# Patient Record
Sex: Male | Born: 1950 | Race: Black or African American | Hispanic: No | Marital: Married | Smoking: Current every day smoker
Health system: Southern US, Community
[De-identification: ages and names within clinical notes are randomized; demographics above are authoritative.]

## PROBLEM LIST (undated history)

## (undated) DIAGNOSIS — E039 Hypothyroidism, unspecified: Secondary | ICD-10-CM

## (undated) DIAGNOSIS — I10 Essential (primary) hypertension: Secondary | ICD-10-CM

## (undated) DIAGNOSIS — I714 Abdominal aortic aneurysm, without rupture, unspecified: Secondary | ICD-10-CM

## (undated) DIAGNOSIS — N189 Chronic kidney disease, unspecified: Secondary | ICD-10-CM

## (undated) DIAGNOSIS — E119 Type 2 diabetes mellitus without complications: Secondary | ICD-10-CM

## (undated) DIAGNOSIS — I739 Peripheral vascular disease, unspecified: Secondary | ICD-10-CM

## (undated) HISTORY — DX: Type 2 diabetes mellitus without complications: E11.9

## (undated) HISTORY — PX: BACK SURGERY: SHX140

## (undated) HISTORY — DX: Chronic kidney disease, unspecified: N18.9

## (undated) HISTORY — PX: SPINE SURGERY: SHX786

---

## 2004-07-27 ENCOUNTER — Ambulatory Visit (HOSPITAL_COMMUNITY): Admission: RE | Admit: 2004-07-27 | Discharge: 2004-07-27 | Payer: Self-pay | Admitting: Family Medicine

## 2004-12-21 ENCOUNTER — Ambulatory Visit (HOSPITAL_COMMUNITY): Admission: RE | Admit: 2004-12-21 | Discharge: 2004-12-21 | Payer: Self-pay | Admitting: *Deleted

## 2010-04-03 ENCOUNTER — Encounter: Payer: Self-pay | Admitting: Gastroenterology

## 2010-04-10 ENCOUNTER — Encounter: Payer: Self-pay | Admitting: Gastroenterology

## 2010-04-13 ENCOUNTER — Ambulatory Visit: Payer: Self-pay | Admitting: Gastroenterology

## 2010-04-13 ENCOUNTER — Ambulatory Visit (HOSPITAL_COMMUNITY): Admission: RE | Admit: 2010-04-13 | Discharge: 2010-04-13 | Payer: Self-pay | Admitting: Gastroenterology

## 2010-05-24 ENCOUNTER — Encounter (INDEPENDENT_AMBULATORY_CARE_PROVIDER_SITE_OTHER): Payer: Self-pay | Admitting: *Deleted

## 2010-08-09 NOTE — Letter (Signed)
Summary: Internal Other Lynne Logan  Internal Other Lynne Logan   Imported By: Waldon Merl LPN [id number]  _____________________________________________________________________  External Attachment:    Type:   Image     Comment:   External Document

## 2010-08-09 NOTE — Miscellaneous (Signed)
Summary: OP NOTE SIGNED  Clinical Lists Changes NAME:  Juan Bass, Juan Bass            ACCOUNT NO.:  [id number]      MEDICAL RECORD NO.:  [id number]          PATIENT TYPE:  AMB      LOCATION:  DAY                           FACILITY:  [facility]      PHYSICIAN:  Barney Drain, M.D.     DATE OF BIRTH:  [birth year]      DATE OF PROCEDURE:  04/13/2010   DATE OF DISCHARGE:                                  OPERATIVE REPORT         REFERRING PHYSICIAN:  Halford Chessman, MD      PROCEDURE:  Colonoscopy.      INDICATION FOR EXAM:  Juan Bass is a 59 year old male who presents   for average-risk colon cancer screening.      FINDINGS:   1. Extremely redundant transverse colon which made intubating the       cecum challenging.   2. Pancolonic diverticulosis, most pronounced in the ascending and       sigmoid colon.  Otherwise; no polyps, masses, inflammatory changes       or AVMs seen.   3. Small internal hemorrhoids.  Otherwise normal retroflexed view of       the rectum.      RECOMMENDATIONS:   1. Screening colonoscopy in 10 years with overtube and a 1-hour time       slot.   2. He should follow a high-fiber diet.  He is given a handout on high-       fiber diet, diverticulosis and hemorrhoids.      MEDICATIONS:   1. Demerol 50 mg IV.   2. Versed 3 mg IV.      PROCEDURE TECHNIQUE:  Physical exam was performed.  Informed consent was   obtained from the patient after explaining the benefits, risks and   alternatives to the procedure.  The patient was connected to the monitor   and placed in left lateral position.  Continuous oxygen was provided by   nasal cannula and IV medicine administered through an indwelling   cannula.  After administration of sedation and rectal exam, the   patient's rectum was intubated and the scope was advanced under direct   visualization to the cecum.  Intubating the cecum required multiple   changes in position and abdominal pressure.  The scope was removed   slowly by carefully examining the color, texture, anatomy and integrity   of the mucosa on the way out.The patient was recovered in endoscopy and discharged home in   satisfactory condition.      Total withdrawal time:  24 minutes.         Barney Drain, M.D.               SF/MEDQ  D:  04/13/2010  T:  04/13/2010  Job:  [id number]      Electronically Signed by Barney Drain M.D. on 05/24/2010 10:00:43 AM

## 2010-08-09 NOTE — Letter (Signed)
Summary: Internal Other  Internal Other   Imported By: Waldon Merl LPN [id number]  _____________________________________________________________________  External Attachment:    Type:   Image     Comment:   External Document

## 2013-12-27 ENCOUNTER — Other Ambulatory Visit (HOSPITAL_COMMUNITY): Payer: Self-pay | Admitting: Family Medicine

## 2013-12-27 DIAGNOSIS — I739 Peripheral vascular disease, unspecified: Secondary | ICD-10-CM

## 2013-12-30 ENCOUNTER — Ambulatory Visit (HOSPITAL_COMMUNITY)
Admission: RE | Admit: 2013-12-30 | Discharge: 2013-12-30 | Disposition: A | Discharge status: Home / Self Care | Payer: BC Managed Care – PPO | Source: Ambulatory Visit | Attending: Family Medicine | Admitting: Family Medicine

## 2013-12-30 DIAGNOSIS — I743 Embolism and thrombosis of arteries of the lower extremities: Secondary | ICD-10-CM | POA: Insufficient documentation

## 2013-12-30 DIAGNOSIS — I739 Peripheral vascular disease, unspecified: Secondary | ICD-10-CM

## 2014-01-04 ENCOUNTER — Other Ambulatory Visit: Payer: Self-pay | Admitting: *Deleted

## 2014-01-04 DIAGNOSIS — I70219 Atherosclerosis of native arteries of extremities with intermittent claudication, unspecified extremity: Secondary | ICD-10-CM

## 2014-01-13 ENCOUNTER — Encounter: Payer: Self-pay | Admitting: Vascular Surgery

## 2014-02-07 ENCOUNTER — Encounter: Payer: Self-pay | Admitting: Vascular Surgery

## 2014-02-08 ENCOUNTER — Encounter: Payer: Self-pay | Admitting: Vascular Surgery

## 2014-02-08 ENCOUNTER — Ambulatory Visit (HOSPITAL_COMMUNITY)
Admission: RE | Admit: 2014-02-08 | Discharge: 2014-02-08 | Disposition: A | Discharge status: Home / Self Care | Payer: BC Managed Care – PPO | Source: Ambulatory Visit | Attending: Vascular Surgery | Admitting: Vascular Surgery

## 2014-02-08 ENCOUNTER — Ambulatory Visit (INDEPENDENT_AMBULATORY_CARE_PROVIDER_SITE_OTHER): Payer: BC Managed Care – PPO | Admitting: Vascular Surgery

## 2014-02-08 VITALS — BP 161/88 | HR 75 | Ht 69.0 in | Wt 180.7 lb

## 2014-02-08 DIAGNOSIS — I70219 Atherosclerosis of native arteries of extremities with intermittent claudication, unspecified extremity: Secondary | ICD-10-CM

## 2014-02-08 DIAGNOSIS — F17200 Nicotine dependence, unspecified, uncomplicated: Secondary | ICD-10-CM | POA: Insufficient documentation

## 2014-02-08 DIAGNOSIS — E119 Type 2 diabetes mellitus without complications: Secondary | ICD-10-CM | POA: Insufficient documentation

## 2014-02-08 NOTE — Progress Notes (Signed)
Subjective:     [name], male   DOB: [birth year], 63 y.o.   MRN: [id number]  HPI this 63 year old male is referred for evaluation of right leg claudication. This has been going on for the last few years becoming more. He developed right calf discomfort which progresses to the point that he must stop ambulating after about one block. He does not have rest pain or history of nonhealing ulcers infection or gangrene. He has no symptoms in the contralateral left leg.he does have a history of type 2 diabetes mellitus and 45+ pack year history of smoking and continues to smoke one pack per day. He has no history of cardiac events or neurologic events.  Past Medical History  Diagnosis Date  . Diabetes mellitus without complication     History  Substance Use Topics  . Smoking status: Current Every Day Smoker -- 1.00 packs/day for 40 years    Types: Cigarettes  . Smokeless tobacco: Never Used  . Alcohol Use: 0.6 - 1.2 oz/week    1-2 Cans of beer per week    Family History  Problem Relation Age of Onset  . Hypertension Mother   . Deep vein thrombosis Father   . Hypertension Father     No Known Allergies  Current outpatient prescriptions:amLODipine (NORVASC) 5 MG tablet, Take 5 mg by mouth daily. 2 tablets daily, Disp: , Rfl: ;  atorvastatin (LIPITOR) 20 MG tablet, Take 20 mg by mouth daily., Disp: , Rfl: ;  glyBURIDE (DIABETA) 5 MG tablet, Take 5 mg by mouth daily with breakfast., Disp: , Rfl: ;  labetalol (NORMODYNE) 100 MG tablet, Take 100 mg by mouth 2 (two) times daily., Disp: , Rfl:  losartan (COZAAR) 100 MG tablet, Take 100 mg by mouth daily., Disp: , Rfl: ;  HYDROcodone-acetaminophen (NORCO/VICODIN) 5-325 MG per tablet, Take 1 tablet by mouth every 6 (six) hours as needed for moderate pain., Disp: , Rfl:   BP 161/88  Pulse 75  Ht 5\' 9"  (1.753 m)  Wt 180 lb 11.2 oz (81.965 kg)  BMI 26.67 kg/m2  SpO2 99%  Body mass index is 26.67  kg/(m^2).           Review of Systems see history of present illness. Denies chest pain, dyspnea on exertion, PND, orthopnea, hemoptysis, lateralizing weakness, any aphasia. Patient does have some numbness in the right foot.. All other systems negative and a complete review of systems     Objective:   Physical Exam BP 161/88  Pulse 75  Ht 5\' 9"  (1.753 m)  Wt 180 lb 11.2 oz (81.965 kg)  BMI 26.67 kg/m2  SpO2 99%  Gen.-alert and oriented x3 in no apparent distress HEENT normal for age Lungs no rhonchi or wheezing Cardiovascular regular rhythm no murmurs carotid pulses 3+ palpable no bruits audible Abdomen soft nontender no palpable masses Musculoskeletal free of  major deformities Skin clear -no rashes Neurologic normal Lower extremities 3+ femoral and dorsalis pedis pulses palpable left leg. Right leg with 3+ femoral-absent popliteal and distal pulses. No evidence of ischemia or gangrene in the right foot. Slight decreased sensation right foot.  Today in order to duplex scan of the right leg which are viewed and interpreted. Patient has what appears to be a greater than 50% stenosis in the right mid superficial femoral artery but the vessel is patent. There is monophasic flow distal to this point and some monophasic flow proximally in the common femoral artery. 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 Trula Slade on Tuesday, August 18. Discussed with patient and his wife and they would like to proceed

## 2014-02-10 ENCOUNTER — Other Ambulatory Visit: Payer: Self-pay

## 2014-02-16 ENCOUNTER — Encounter (HOSPITAL_COMMUNITY): Payer: Self-pay | Admitting: Pharmacy Technician

## 2014-02-21 MED ORDER — SODIUM CHLORIDE 0.9 % IV SOLN
INTRAVENOUS | Status: DC
  Administered 2014-02-22: 07:00:00 via INTRAVENOUS

## 2014-02-22 ENCOUNTER — Encounter (HOSPITAL_COMMUNITY)
Admission: RE | Disposition: A | Discharge status: Home / Self Care | Payer: Self-pay | Source: Ambulatory Visit | Attending: Surgery

## 2014-02-22 ENCOUNTER — Other Ambulatory Visit: Payer: Self-pay

## 2014-02-22 ENCOUNTER — Ambulatory Visit (HOSPITAL_COMMUNITY)
Admission: RE | Admit: 2014-02-22 | Discharge: 2014-02-22 | Disposition: A | Discharge status: Home / Self Care | Payer: BC Managed Care – PPO | Source: Ambulatory Visit | Attending: Surgery | Admitting: Surgery

## 2014-02-22 DIAGNOSIS — I70219 Atherosclerosis of native arteries of extremities with intermittent claudication, unspecified extremity: Secondary | ICD-10-CM

## 2014-02-22 DIAGNOSIS — I714 Abdominal aortic aneurysm, without rupture, unspecified: Secondary | ICD-10-CM | POA: Diagnosis not present

## 2014-02-22 DIAGNOSIS — I745 Embolism and thrombosis of iliac artery: Secondary | ICD-10-CM | POA: Diagnosis not present

## 2014-02-22 DIAGNOSIS — E119 Type 2 diabetes mellitus without complications: Secondary | ICD-10-CM | POA: Insufficient documentation

## 2014-02-22 DIAGNOSIS — I739 Peripheral vascular disease, unspecified: Secondary | ICD-10-CM | POA: Diagnosis present

## 2014-02-22 DIAGNOSIS — F17200 Nicotine dependence, unspecified, uncomplicated: Secondary | ICD-10-CM | POA: Diagnosis not present

## 2014-02-22 DIAGNOSIS — Z48812 Encounter for surgical aftercare following surgery on the circulatory system: Secondary | ICD-10-CM

## 2014-02-22 HISTORY — PX: ABDOMINAL AORTAGRAM: SHX5454

## 2014-02-22 LAB — GLUCOSE, CAPILLARY
GLUCOSE-CAPILLARY: 161 mg/dL — AB (ref 70–99)
GLUCOSE-CAPILLARY: 167 mg/dL — AB (ref 70–99)

## 2014-02-22 LAB — POCT I-STAT, CHEM 8
BUN: 16 mg/dL (ref 6–23)
CALCIUM ION: 1.14 mmol/L (ref 1.13–1.30)
CREATININE: 1.8 mg/dL — AB (ref 0.50–1.35)
Chloride: 109 mEq/L (ref 96–112)
GLUCOSE: 150 mg/dL — AB (ref 70–99)
HCT: 38 % — ABNORMAL LOW (ref 39.0–52.0)
HEMOGLOBIN: 12.9 g/dL — AB (ref 13.0–17.0)
Potassium: 4 mEq/L (ref 3.7–5.3)
Sodium: 141 mEq/L (ref 137–147)
TCO2: 19 mmol/L (ref 0–100)

## 2014-02-22 LAB — POCT ACTIVATED CLOTTING TIME
Activated Clotting Time: 203 seconds
Activated Clotting Time: 231 seconds
Activated Clotting Time: 180 seconds

## 2014-02-22 SURGERY — ABDOMINAL AORTAGRAM
Anesthesia: LOCAL | Laterality: Right

## 2014-02-22 MED ORDER — SODIUM CHLORIDE 0.9 % IV SOLN: 1.0000 mL/kg/h | INTRAVENOUS | Status: DC

## 2014-02-22 MED ORDER — GUAIFENESIN-DM 100-10 MG/5ML PO SYRP
15.0000 mL | ORAL_SOLUTION | ORAL | Status: DC | PRN
  Filled 2014-02-22: qty 15

## 2014-02-22 MED ORDER — METOPROLOL TARTRATE 1 MG/ML IV SOLN
2.0000 mg | INTRAVENOUS | Status: DC | PRN
Start: 2014-02-22 — End: 2014-02-22

## 2014-02-22 MED ORDER — HEPARIN (PORCINE) IN NACL 2-0.9 UNIT/ML-% IJ SOLN
INTRAMUSCULAR | Status: AC
  Filled 2014-02-22: qty 1000

## 2014-02-22 MED ORDER — ONDANSETRON HCL 4 MG/2ML IJ SOLN: 4.0000 mg | Freq: Four times a day (QID) | INTRAMUSCULAR | Status: DC | PRN

## 2014-02-22 MED ORDER — FENTANYL CITRATE 0.05 MG/ML IJ SOLN
INTRAMUSCULAR | Status: AC
  Filled 2014-02-22: qty 2

## 2014-02-22 MED ORDER — MIDAZOLAM HCL 2 MG/2ML IJ SOLN
INTRAMUSCULAR | Status: AC
  Filled 2014-02-22: qty 2

## 2014-02-22 MED ORDER — LABETALOL HCL 5 MG/ML IV SOLN: 10.0000 mg | INTRAVENOUS | Status: DC | PRN

## 2014-02-22 MED ORDER — LIDOCAINE HCL (PF) 1 % IJ SOLN
INTRAMUSCULAR | Status: AC
  Filled 2014-02-22: qty 30

## 2014-02-22 MED ORDER — HYDRALAZINE HCL 20 MG/ML IJ SOLN: 10.0000 mg | INTRAMUSCULAR | Status: DC | PRN

## 2014-02-22 MED ORDER — HEPARIN SODIUM (PORCINE) 1000 UNIT/ML IJ SOLN
INTRAMUSCULAR | Status: AC
  Filled 2014-02-22: qty 1

## 2014-02-22 MED ORDER — ACETAMINOPHEN 325 MG RE SUPP
325.0000 mg | RECTAL | Status: DC | PRN
Start: 2014-02-22 — End: 2014-02-22
  Filled 2014-02-22: qty 2

## 2014-02-22 MED ORDER — ACETAMINOPHEN 325 MG PO TABS
325.0000 mg | ORAL_TABLET | ORAL | Status: DC | PRN
  Filled 2014-02-22: qty 2

## 2014-02-22 MED ORDER — PHENOL 1.4 % MT LIQD
1.0000 | OROMUCOSAL | Status: DC | PRN
  Filled 2014-02-22: qty 177

## 2014-02-22 MED ORDER — SODIUM CHLORIDE 0.9 % IV SOLN: INTRAVENOUS | Status: DC

## 2014-02-22 MED ORDER — ALUM & MAG HYDROXIDE-SIMETH 200-200-20 MG/5ML PO SUSP
15.0000 mL | ORAL | Status: DC | PRN
  Filled 2014-02-22: qty 30

## 2014-02-22 SURGICAL SUPPLY — 55 items
ADH SKN CLS APL DERMABOND .7 (GAUZE/BANDAGES/DRESSINGS) ×3
BANDAGE ELASTIC 4 VELCRO ST LF (GAUZE/BANDAGES/DRESSINGS) IMPLANT
BANDAGE ESMARK 6X9 LF (GAUZE/BANDAGES/DRESSINGS) IMPLANT
BNDG CMPR 9X6 STRL LF SNTH (GAUZE/BANDAGES/DRESSINGS)
BNDG ESMARK 6X9 LF (GAUZE/BANDAGES/DRESSINGS)
CANISTER SUCTION 2500CC (MISCELLANEOUS) ×4 IMPLANT
CLIP TI MEDIUM 24 (CLIP) ×4 IMPLANT
CLIP TI WIDE RED SMALL 24 (CLIP) ×4 IMPLANT
COVER SURGICAL LIGHT HANDLE (MISCELLANEOUS) ×4 IMPLANT
CUFF TOURNIQUET SINGLE 24IN (TOURNIQUET CUFF) IMPLANT
CUFF TOURNIQUET SINGLE 34IN LL (TOURNIQUET CUFF) IMPLANT
CUFF TOURNIQUET SINGLE 44IN (TOURNIQUET CUFF) IMPLANT
DERMABOND ADVANCED (GAUZE/BANDAGES/DRESSINGS) ×1
DERMABOND ADVANCED .7 DNX12 (GAUZE/BANDAGES/DRESSINGS) ×3 IMPLANT
DRAIN CHANNEL 15F RND FF W/TCR (WOUND CARE) IMPLANT
DRAPE WARM FLUID 44X44 (DRAPE) ×4 IMPLANT
DRAPE X-RAY CASS 24X20 (DRAPES) IMPLANT
DRSG COVADERM 4X10 (GAUZE/BANDAGES/DRESSINGS) IMPLANT
DRSG COVADERM 4X8 (GAUZE/BANDAGES/DRESSINGS) IMPLANT
ELECT REM PT RETURN 9FT ADLT (ELECTROSURGICAL) ×4
ELECTRODE REM PT RTRN 9FT ADLT (ELECTROSURGICAL) ×3 IMPLANT
EVACUATOR SILICONE 100CC (DRAIN) IMPLANT
GLOVE BIOGEL PI IND STRL 7.5 (GLOVE) ×3 IMPLANT
GLOVE BIOGEL PI INDICATOR 7.5 (GLOVE) ×1
GLOVE SURG SS PI 7.5 STRL IVOR (GLOVE) ×4 IMPLANT
GOWN PREVENTION PLUS XXLARGE (GOWN DISPOSABLE) ×4 IMPLANT
GOWN STRL NON-REIN LRG LVL3 (GOWN DISPOSABLE) ×12 IMPLANT
HEMOSTAT SNOW SURGICEL 2X4 (HEMOSTASIS) IMPLANT
KIT BASIN OR (CUSTOM PROCEDURE TRAY) ×4 IMPLANT
KIT ROOM TURNOVER OR (KITS) ×4 IMPLANT
MARKER GRAFT CORONARY BYPASS (MISCELLANEOUS) IMPLANT
NS IRRIG 1000ML POUR BTL (IV SOLUTION) ×8 IMPLANT
PACK PERIPHERAL VASCULAR (CUSTOM PROCEDURE TRAY) ×4 IMPLANT
PAD ARMBOARD 7.5X6 YLW CONV (MISCELLANEOUS) ×8 IMPLANT
PADDING CAST COTTON 6X4 STRL (CAST SUPPLIES) IMPLANT
SET COLLECT BLD 21X3/4 12 (NEEDLE) IMPLANT
STOPCOCK 4 WAY LG BORE MALE ST (IV SETS) IMPLANT
SUT ETHILON 3 0 PS 1 (SUTURE) IMPLANT
SUT PROLENE 5 0 C 1 24 (SUTURE) ×4 IMPLANT
SUT PROLENE 6 0 BV (SUTURE) ×4 IMPLANT
SUT PROLENE 7 0 BV 1 (SUTURE) IMPLANT
SUT SILK 2 0 SH (SUTURE) ×4 IMPLANT
SUT SILK 3 0 (SUTURE)
SUT SILK 3-0 18XBRD TIE 12 (SUTURE) IMPLANT
SUT VIC AB 2-0 CT1 27 (SUTURE) ×8
SUT VIC AB 2-0 CT1 TAPERPNT 27 (SUTURE) ×6 IMPLANT
SUT VIC AB 3-0 SH 27 (SUTURE) ×8
SUT VIC AB 3-0 SH 27X BRD (SUTURE) ×6 IMPLANT
SUT VICRYL 4-0 PS2 18IN ABS (SUTURE) ×8 IMPLANT
TOWEL OR 17X24 6PK STRL BLUE (TOWEL DISPOSABLE) ×8 IMPLANT
TOWEL OR 17X26 10 PK STRL BLUE (TOWEL DISPOSABLE) ×8 IMPLANT
TRAY FOLEY CATH 16FRSI W/METER (SET/KITS/TRAYS/PACK) ×4 IMPLANT
TUBING EXTENTION W/L.L. (IV SETS) IMPLANT
UNDERPAD 30X30 INCONTINENT (UNDERPADS AND DIAPERS) ×4 IMPLANT
WATER STERILE IRR 1000ML POUR (IV SOLUTION) ×4 IMPLANT

## 2014-02-22 NOTE — Progress Notes (Signed)
Assumed care of pt from Madlyn Frankel, RN. Report received. Assessment documented.

## 2014-02-22 NOTE — Interval H&P Note (Signed)
History and Physical Interval Note:  02/22/2014 8:47 AM  Juan Bass  has presented today for surgery, with the diagnosis of claudication of right leg, secondary to FPOD  The various methods of treatment have been discussed with the patient and family. After consideration of risks, benefits and other options for treatment, the patient has consented to  Procedure(s): ABDOMINAL AORTAGRAM (N/A) as a surgical intervention .  The patient's history has been reviewed, patient examined, no change in status, stable for surgery.  I have reviewed the patient's chart and labs.  Questions were answered to the patient's satisfaction.     [name] IV, V. WELLS

## 2014-02-22 NOTE — Discharge Instructions (Signed)
Angiogram, Care After ° °Refer to this sheet in the next few weeks. These instructions provide you with information on caring for yourself after your procedure. Your health care provider may also give you more specific instructions. Your treatment has been planned according to current medical practices, but problems sometimes occur. Call your health care provider if you have any problems or questions after your procedure.  °WHAT TO EXPECT AFTER THE PROCEDURE °After your procedure, it is typical to have the following sensations: °· Minor discomfort or tenderness and a small bump at the catheter insertion site. The bump should usually decrease in size and tenderness within 1 to 2 weeks. °· Any bruising will usually fade within 2 to 4 weeks. °HOME CARE INSTRUCTIONS  °· You may need to keep taking blood thinners if they were prescribed for you. Take medicines only as directed by your health care provider. °· Do not apply powder or lotion to the site. °· Do not take baths, swim, or use a hot tub until your health care provider approves. °· You may shower 24 hours after the procedure. Remove the bandage (dressing) and gently wash the site with plain soap and water. Gently pat the site dry. °· Inspect the site at least twice daily. °· Limit your activity for the first 24 hours. Do not bend, squat, or lift anything over 10 lb (9 kg) or as directed by your health care provider. °· Plan to have someone take you home after the procedure. Follow instructions about when you can drive or return to work. °SEEK MEDICAL CARE IF: °· You get light-headed when standing up. °· You have drainage (other than a small amount of blood on the dressing). °· You have chills. °· You have a fever. °· You have redness, warmth, swelling, or pain at the insertion site. °SEEK IMMEDIATE MEDICAL CARE IF:  °· You develop chest pain or shortness of breath, feel faint, or pass out. °· You have bleeding, swelling larger than a walnut, or drainage from the  catheter insertion site. °· You develop pain, discoloration, coldness, or severe bruising in the leg or arm that held the catheter. °· You have heavy bleeding from the site. If this happens, hold pressure on the site. °MAKE SURE YOU: °· Understand these instructions. °· Will watch your condition. °· Will get help right away if you are not doing well or get worse. °Document Released: 01/10/2005 Document Revised: 11/08/2013 Document Reviewed: 11/16/2012 °ExitCare® Patient Information ©2015 [employer]. This information is not intended to replace advice given to you by your health care provider. Make sure you discuss any questions you have with your health care provider. ° °

## 2014-02-22 NOTE — H&P (View-Only) (Signed)
Subjective:     [name], male   DOB: 1951/04/18, 63 y.o.   MRN: [id number]  HPI this 63 year old male is referred for evaluation of right leg claudication. This has been going on for the last few years becoming more. He developed right calf discomfort which progresses to the point that he must stop ambulating after about one block. He does not have rest pain or history of nonhealing ulcers infection or gangrene. He has no symptoms in the contralateral left leg.he does have a history of type 2 diabetes mellitus and 45+ pack year history of smoking and continues to smoke one pack per day. He has no history of cardiac events or neurologic events.  Past Medical History  Diagnosis Date  . Diabetes mellitus without complication     History  Substance Use Topics  . Smoking status: Current Every Day Smoker -- 1.00 packs/day for 40 years    Types: Cigarettes  . Smokeless tobacco: Never Used  . Alcohol Use: 0.6 - 1.2 oz/week    1-2 Cans of beer per week    Family History  Problem Relation Age of Onset  . Hypertension Mother   . Deep vein thrombosis Father   . Hypertension Father     No Known Allergies  Current outpatient prescriptions:amLODipine (NORVASC) 5 MG tablet, Take 5 mg by mouth daily. 2 tablets daily, Disp: , Rfl: ;  atorvastatin (LIPITOR) 20 MG tablet, Take 20 mg by mouth daily., Disp: , Rfl: ;  glyBURIDE (DIABETA) 5 MG tablet, Take 5 mg by mouth daily with breakfast., Disp: , Rfl: ;  labetalol (NORMODYNE) 100 MG tablet, Take 100 mg by mouth 2 (two) times daily., Disp: , Rfl:  losartan (COZAAR) 100 MG tablet, Take 100 mg by mouth daily., Disp: , Rfl: ;  HYDROcodone-acetaminophen (NORCO/VICODIN) 5-325 MG per tablet, Take 1 tablet by mouth every 6 (six) hours as needed for moderate pain., Disp: , Rfl:   BP 161/88  Pulse 75  Ht 5\' 9"  (1.753 m)  Wt 180 lb 11.2 oz (81.965 kg)  BMI 26.67 kg/m2  SpO2 99%  Body mass index is 26.67  kg/(m^2).           Review of Systems see history of present illness. Denies chest pain, dyspnea on exertion, PND, orthopnea, hemoptysis, lateralizing weakness, any aphasia. Patient does have some numbness in the right foot.. All other systems negative and a complete review of systems     Objective:   Physical Exam BP 161/88  Pulse 75  Ht 5\' 9"  (1.753 m)  Wt 180 lb 11.2 oz (81.965 kg)  BMI 26.67 kg/m2  SpO2 99%  Gen.-alert and oriented x3 in no apparent distress HEENT normal for age Lungs no rhonchi or wheezing Cardiovascular regular rhythm no murmurs carotid pulses 3+ palpable no bruits audible Abdomen soft nontender no palpable masses Musculoskeletal free of  major deformities Skin clear -no rashes Neurologic normal Lower extremities 3+ femoral and dorsalis pedis pulses palpable left leg. Right leg with 3+ femoral-absent popliteal and distal pulses. No evidence of ischemia or gangrene in the right foot. Slight decreased sensation right foot.  Today in order to duplex scan of the right leg which are viewed and interpreted. Patient has what appears to be a greater than 50% stenosis in the right mid superficial femoral artery but the vessel is patent. There is monophasic flow distal to this point and some monophasic flow proximally in the common femoral artery. 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 Trula Slade on Tuesday, August 18. Discussed with patient and his wife and they would like to proceed

## 2014-02-22 NOTE — Op Note (Signed)
Patient name: Juan Bass MRN: [id number] DOB: [birth year] Sex: male  02/22/2014 Pre-operative Diagnosis: Right leg claudication Post-operative diagnosis:  Same Surgeon:  Eldridge Abrahams Procedure Performed:  1.  ultrasound-guided access, left femoral artery  2.  ultrasound-guided access, right femoral artery  3.  catheter in aorta x2  4.  abdominal aortogram with bifemoral runoff  5.  stent, right external iliac artery  6.  second order catheterization    Indications:  The patient comes in while still limiting claudication in the right leg.  Ultrasound suggests inflow disease and a slight lesion in the right superficial femoral artery.  His creatinine was 1.8.  Procedure:  The patient was identified in the holding area and taken to room 8.  The patient was then placed supine on the table and prepped and draped in the usual sterile fashion.  A time out was called.  Ultrasound was used to evaluate the left common femoral artery.  It was patent .  A digital ultrasound image was acquired.  A micropuncture needle was used to access the left common femoral artery under ultrasound guidance.  An 018 wire was advanced without resistance and a micropuncture sheath was placed.  The 018 wire was removed and a benson wire was placed.  The micropuncture sheath was exchanged for a 5 french sheath.  An omniflush catheter was advanced over the wire to the level of L-1.  An abdominal angiogram was obtained with CO2. Next, the Omni flush catheter was pulled down to the aortic bifurcation and pelvic angiography was obtained in multiple views.  Findings:   Aortogram:  Renal arteries are not well evaluated.  There is aneurysmal generation of the infrarenal abdominal aorta.  The left common iliac artery is calcified but patent.  There is moderate stenosis at the origin of the left hypogastric artery the left external iliac artery is widely patent.  The right common iliac artery is patent with  pseudoaneurysmal changes.  The right hypogastric artery has a moderate stenosis at its origin.  The right external iliac artery is occluded with reconstitution at the level of the femoral head.  Right Lower Extremity:  The right common femoral and proximal profunda and superficial femoral artery are patent and reconstitutes 1 proximal internal iliac arteries.  Left Lower Extremity:  The left common femoral artery is widely patent.  Intervention:  After the above images were acquired, the decision was made to proceed with an attempt at intervention.  Ultrasound was used to evaluate the right common femoral artery.  This was cannulated under ultrasound guidance with a micropuncture needle.  An 018 wire was advanced without resistance a micropuncture sheath was placed.  This was exchanged out for a 6 [place] sheath.  I then tried to cross the occlusion using a 4 French straight catheter and a Glidewire.  The patient was fully heparinized.  I ultimately ended up in a subintimal plane and could not gain reentry into the right common iliac artery.  Identified to come from the contralateral left side.  I was ultimately able to gain access into the right common femoral artery from the left sided access.  I then used a snare to grasp the Rosen wire which was placed.  A Rosen wire was pulled out through the sheath and the right groin.  I then inserted a dilator to a 6 [place] sheath and advanced the 6 French sheath from the right groin into the aorta.  Next the subintimal tract was dilated  using a 4 mm balloon.  I then elected to place the stents within the right external iliac artery.  I first started distally.  A Cordis 8 x 80 stent was deployed and molded with a 7 mm balloon.  I extended proximally with an 8 x 40 stent, landing at the level hypogastric artery.  I was not satisfied with the configuration of the stent in the distal external iliac artery.  I felt that it had been invaginated from manipulation of the sheath.   I therefore extended 9 x 40 Cordis self-expanding stent.  Everything was molded with a 7 mm balloon and a completion arteriogram was performed which showed a widely patent right common and external iliac artery with preservation of the right hypogastric artery.  Catheters and wires were removed.  The patient taken the holding area for sheath pull once his coagulation profile corrects.  Impression:  #1  infrarenal abdominal aortic aneurysm.  The size cannot be determined based on the above images.  #2  right external iliac artery occlusion which was successfully recannulized and stented using an 8 mm Cordis self-expanding stents.             #3.  A total of 94 cc of contrast was utilized.  The full lower extremity was not visualized given the patient's renal disease     V. Annamarie Major, M.D. [facility] Office: [phone] Pager:  [phone]

## 2014-02-22 NOTE — Progress Notes (Signed)
Left femoral arterial sheath removed

## 2014-02-22 NOTE — Progress Notes (Signed)
Site area: rt groin Site Prior to Removal:  Level 0 Pressure Applied For: 30 minutes Manual:   yes Patient Status During Pull:  stable Post Pull Site:  Level 0 Post Pull Instructions Given:  yes Post Pull Pulses Present: yes Dressing Applied:  yes Bedrest begins @  Comments: no complications

## 2014-02-22 NOTE — Progress Notes (Signed)
Site area: left groin arterial sheath Site Prior to Removal:  Level 0 Pressure Applied For:20 minutes Manual:   yes Patient Status During Pull:  No complications  Post Pull Site:  Level 0 Post Pull Instructions Given:  yes Post Pull Pulses Present: dp 2+ Dressing Applied:  tegaderm Bedrest begins @ 6815 Comments:

## 2014-02-23 ENCOUNTER — Telehealth: Payer: Self-pay | Admitting: Vascular Surgery

## 2014-02-23 NOTE — Telephone Encounter (Addendum)
Message copied by Gena Fray on Wed Feb 23, 2014 12:38 PM ------      Message from: Denman George      Created: Tue Feb 22, 2014 11:37 AM      Regarding: Zigmund Daniel log;  also needs 4 wk. f/u with JDL including ABI's and AAA duplex                   ----- Message -----         From: Serafina Mitchell, MD         Sent: 02/22/2014  11:28 AM           To: [facility] Charge Pool            02/22/2014:            Surgeon:  Eldridge Abrahams      Procedure Performed:       1.  ultrasound-guided access, left femoral artery       2.  ultrasound-guided access, right femoral artery       3.  catheter in aorta x2       4.  abdominal aortogram with bifemoral runoff       5.  stent, right external iliac artery       6.  second order catheterization                  The patient will need to followup in 4 weeks to see Dr. Kellie Simmering.  Prior to the visit he will need ankle-brachial indices and an abdominal aortic aneurysm duplex ultrasound ------  02/23/14: spoke with patient to schedule appointment, dpm

## 2014-03-21 ENCOUNTER — Encounter: Payer: Self-pay | Admitting: Vascular Surgery

## 2014-03-22 ENCOUNTER — Ambulatory Visit (INDEPENDENT_AMBULATORY_CARE_PROVIDER_SITE_OTHER): Payer: BC Managed Care – PPO | Admitting: Vascular Surgery

## 2014-03-22 ENCOUNTER — Ambulatory Visit (HOSPITAL_COMMUNITY)
Admission: RE | Admit: 2014-03-22 | Discharge: 2014-03-22 | Disposition: A | Discharge status: Home / Self Care | Payer: BC Managed Care – PPO | Source: Ambulatory Visit | Attending: Vascular Surgery | Admitting: Vascular Surgery

## 2014-03-22 ENCOUNTER — Ambulatory Visit (INDEPENDENT_AMBULATORY_CARE_PROVIDER_SITE_OTHER)
Admission: RE | Admit: 2014-03-22 | Discharge: 2014-03-22 | Disposition: A | Discharge status: Home / Self Care | Payer: BC Managed Care – PPO | Source: Ambulatory Visit | Attending: Vascular Surgery | Admitting: Vascular Surgery

## 2014-03-22 ENCOUNTER — Encounter: Payer: Self-pay | Admitting: Vascular Surgery

## 2014-03-22 VITALS — BP 151/81 | HR 69 | Ht 69.0 in | Wt 179.1 lb

## 2014-03-22 DIAGNOSIS — I714 Abdominal aortic aneurysm, without rupture, unspecified: Secondary | ICD-10-CM | POA: Diagnosis not present

## 2014-03-22 DIAGNOSIS — Z48812 Encounter for surgical aftercare following surgery on the circulatory system: Secondary | ICD-10-CM | POA: Insufficient documentation

## 2014-03-22 DIAGNOSIS — I70219 Atherosclerosis of native arteries of extremities with intermittent claudication, unspecified extremity: Secondary | ICD-10-CM

## 2014-03-22 NOTE — Addendum Note (Signed)
Addended by: Mena Goes on: 03/22/2014 01:40 PM   Modules accepted: Orders

## 2014-03-22 NOTE — Progress Notes (Signed)
Subjective:     [name], male   DOB: 1950-11-02, 63 y.o.   MRN: [id number]  HPI this 63 year old male returns for initial followup following his right external iliac artery stent inserted by Dr. Trula Slade on 08/18--2015. Patient had severe right leg claudication symptoms which have been completely relieved. He is able to ambulate any distance without discomfort. He is on aspirin and Plavix. He continues to smoke one pack of cigarettes per day and has done so for 40+ years. He has no history of coronary artery disease or cerebrovascular disease. He also appears to have a small abdominal aortic aneurysm which was noted at the time of his angiogram although the dimensions are unclear.  Past Medical History  Diagnosis Date  . Diabetes mellitus without complication     History  Substance Use Topics  . Smoking status: Current Every Day Smoker -- 1.00 packs/day for 40 years    Types: Cigarettes  . Smokeless tobacco: Never Used  . Alcohol Use: 0.6 - 1.2 oz/week    1-2 Cans of beer per week    Family History  Problem Relation Age of Onset  . Hypertension Mother   . Deep vein thrombosis Father   . Hypertension Father     No Known Allergies  Current outpatient prescriptions:amLODipine (NORVASC) 10 MG tablet, Take 10 mg by mouth daily., Disp: , Rfl: ;  aspirin EC 81 MG tablet, Take 81 mg by mouth daily., Disp: , Rfl: ;  atorvastatin (LIPITOR) 20 MG tablet, Take 20 mg by mouth at bedtime. , Disp: , Rfl: ;  glyBURIDE (DIABETA) 5 MG tablet, Take 5 mg by mouth daily with breakfast., Disp: , Rfl: ;  labetalol (NORMODYNE) 100 MG tablet, Take 100 mg by mouth daily. , Disp: , Rfl:  losartan (COZAAR) 100 MG tablet, Take 100 mg by mouth daily., Disp: , Rfl:   BP 151/81  Pulse 69  Ht 5\' 9"  (1.753 m)  Wt 179 lb 1.6 oz (81.239 kg)  BMI 26.44 kg/m2  SpO2 100%  Body mass index is 26.44 kg/(m^2).           Review of Systems denies chest pain, dyspnea on exertion, PND,  orthopnea, lateralizing weakness, aphasia, amaurosis fugax,-all systems negative complete review of systems     Objective:   Physical Exam BP 151/81  Pulse 69  Ht 5\' 9"  (1.753 m)  Wt 179 lb 1.6 oz (81.239 kg)  BMI 26.44 kg/m2  SpO2 100%  Gen.-alert and oriented x3 in no apparent distress HEENT normal for age Lungs no rhonchi or wheezing Cardiovascular regular rhythm no murmurs carotid pulses 3+ palpable no bruits audible Abdomen soft nontender no palpable masses Musculoskeletal free of  major deformities Skin clear -no rashes Neurologic normal Lower extremities 3+ femoral and dorsalis pedis pulses palpable bilaterally with no edema  Today I ordered lower remedy ABIs which I reviewed and interpreted. ABI on the right leg is now 1.1 compared to 0.47 preoperatively in the left leg is 1.06 with triphasic flow bilaterally. Also ordered a duplex scan of the abdominal aorta and the iliac stent which is widely patent. The largest diameter of the aneurysm is 3.5 x 3.6 cm.        Assessment:     Status post right external iliac artery stent for totally occluded vessel with severe claudication-now relieved Small abdominal aortic aneurysm 3.5 x 3.6 cm Ongoing tobacco abuse Type 2 diabetes mellitus    Plan:     Patient was advised  to make all efforts to discontinue tobacco use 2 return in 6 months with duplex scan of iliac stent and repeat ABIs Will need followup duplex scan of the abdominal aorta in 12 months-small aortic aneurysm Return in 6 months to see nurse practitioner

## 2014-06-16 ENCOUNTER — Encounter (HOSPITAL_COMMUNITY): Payer: Self-pay | Admitting: Surgery

## 2014-09-23 ENCOUNTER — Encounter: Payer: Self-pay | Admitting: Family

## 2014-09-26 ENCOUNTER — Other Ambulatory Visit (HOSPITAL_COMMUNITY): Payer: BC Managed Care – PPO

## 2014-09-26 ENCOUNTER — Ambulatory Visit (INDEPENDENT_AMBULATORY_CARE_PROVIDER_SITE_OTHER)
Admission: RE | Admit: 2014-09-26 | Discharge: 2014-09-26 | Disposition: A | Discharge status: Home / Self Care | Payer: 59 | Source: Ambulatory Visit | Attending: Vascular Surgery | Admitting: Vascular Surgery

## 2014-09-26 ENCOUNTER — Encounter: Payer: Self-pay | Admitting: Family

## 2014-09-26 ENCOUNTER — Ambulatory Visit (INDEPENDENT_AMBULATORY_CARE_PROVIDER_SITE_OTHER): Payer: 59 | Admitting: Family

## 2014-09-26 ENCOUNTER — Other Ambulatory Visit: Payer: Self-pay | Admitting: Vascular Surgery

## 2014-09-26 ENCOUNTER — Ambulatory Visit (HOSPITAL_COMMUNITY)
Admission: RE | Admit: 2014-09-26 | Discharge: 2014-09-26 | Disposition: A | Discharge status: Home / Self Care | Payer: 59 | Source: Ambulatory Visit | Attending: Family | Admitting: Family

## 2014-09-26 VITALS — BP 139/83 | HR 74 | Resp 16 | Ht 69.0 in | Wt 177.0 lb

## 2014-09-26 DIAGNOSIS — I771 Stricture of artery: Secondary | ICD-10-CM | POA: Insufficient documentation

## 2014-09-26 DIAGNOSIS — I739 Peripheral vascular disease, unspecified: Secondary | ICD-10-CM | POA: Insufficient documentation

## 2014-09-26 DIAGNOSIS — I70219 Atherosclerosis of native arteries of extremities with intermittent claudication, unspecified extremity: Secondary | ICD-10-CM

## 2014-09-26 DIAGNOSIS — E1159 Type 2 diabetes mellitus with other circulatory complications: Secondary | ICD-10-CM | POA: Insufficient documentation

## 2014-09-26 DIAGNOSIS — Z48812 Encounter for surgical aftercare following surgery on the circulatory system: Secondary | ICD-10-CM

## 2014-09-26 DIAGNOSIS — R0989 Other specified symptoms and signs involving the circulatory and respiratory systems: Secondary | ICD-10-CM | POA: Insufficient documentation

## 2014-09-26 DIAGNOSIS — Z720 Tobacco use: Secondary | ICD-10-CM | POA: Diagnosis not present

## 2014-09-26 DIAGNOSIS — Z9889 Other specified postprocedural states: Secondary | ICD-10-CM | POA: Diagnosis not present

## 2014-09-26 DIAGNOSIS — I714 Abdominal aortic aneurysm, without rupture, unspecified: Secondary | ICD-10-CM

## 2014-09-26 DIAGNOSIS — E1151 Type 2 diabetes mellitus with diabetic peripheral angiopathy without gangrene: Secondary | ICD-10-CM

## 2014-09-26 DIAGNOSIS — Z789 Other specified health status: Secondary | ICD-10-CM | POA: Insufficient documentation

## 2014-09-26 DIAGNOSIS — F17200 Nicotine dependence, unspecified, uncomplicated: Secondary | ICD-10-CM | POA: Diagnosis not present

## 2014-09-26 DIAGNOSIS — Z95828 Presence of other vascular implants and grafts: Secondary | ICD-10-CM

## 2014-09-26 NOTE — Progress Notes (Signed)
[facility]  HISTORY AND PHYSICAL   MRN : [id number]  History of Present Illness:   Juan Bass is a 64 y.o. male patient of Dr. Kellie Simmering who returns for follow up s/p right external iliac artery stent inserted by Dr. Trula Slade on 02/22/2014. Patient had severe right leg claudication symptoms which have been completely relieved. He is able to ambulate any distance without discomfort. He is on aspirin and Plavix. He continues to smoke  and has done so for 40+ years. He has no history of coronary artery disease or cerebrovascular disease. He also appears to have a small abdominal aortic aneurysm which was noted at the time of his angiogram.  Pt states he has no barriers to walking, does not walk much.  Pt Diabetic: Yes, states in good control Pt smoker: smoker  (1/2 ppd, started at age 59 yrs)  Pt meds include: Statin :Yes ASA: Yes Other anticoagulants/antiplatelets: no  Current Outpatient Prescriptions  Medication Sig Dispense Refill  . amLODipine (NORVASC) 10 MG tablet Take 10 mg by mouth daily.    Marland Kitchen aspirin EC 81 MG tablet Take 81 mg by mouth daily.    Marland Kitchen atorvastatin (LIPITOR) 20 MG tablet Take 20 mg by mouth at bedtime.     Marland Kitchen glyBURIDE (DIABETA) 5 MG tablet Take 5 mg by mouth daily with breakfast.    . labetalol (NORMODYNE) 100 MG tablet Take 100 mg by mouth 2 (two) times daily. Two Tablets twice daily    . losartan (COZAAR) 100 MG tablet Take 100 mg by mouth daily.     No current facility-administered medications for this visit.    Past Medical History  Diagnosis Date  . Diabetes mellitus without complication     Social History History  Substance Use Topics  . Smoking status: Light Tobacco Smoker -- 1.00 packs/day for 40 years    Types: Cigarettes  . Smokeless tobacco: Never Used  . Alcohol Use: 0.6 - 1.2 oz/week    1-2 Cans of beer per week    Family History Family History  Problem Relation Age of Onset  . Hypertension Mother   .  Deep vein thrombosis Father   . Hypertension Father   . Hypertension Sister   . Hypertension Brother     Surgical History Past Surgical History  Procedure Laterality Date  . Back surgery    . Abdominal aortagram N/A 02/22/2014    Procedure: ABDOMINAL Maxcine Ham;  Surgeon: Serafina Mitchell, MD;  Location: [facility] CATH LAB;  Service: Cardiovascular;  Laterality: N/A;  . Spine surgery      No Known Allergies  Current Outpatient Prescriptions  Medication Sig Dispense Refill  . amLODipine (NORVASC) 10 MG tablet Take 10 mg by mouth daily.    Marland Kitchen aspirin EC 81 MG tablet Take 81 mg by mouth daily.    Marland Kitchen atorvastatin (LIPITOR) 20 MG tablet Take 20 mg by mouth at bedtime.     Marland Kitchen glyBURIDE (DIABETA) 5 MG tablet Take 5 mg by mouth daily with breakfast.    . labetalol (NORMODYNE) 100 MG tablet Take 100 mg by mouth 2 (two) times daily. Two Tablets twice daily    . losartan (COZAAR) 100 MG tablet Take 100 mg by mouth daily.     No current facility-administered medications for this visit.     REVIEW OF SYSTEMS: See HPI for pertinent positives and negatives.  Physical Examination Filed Vitals:   09/26/14 1022  BP: 139/83  Pulse: 74  Resp: 16  Height: 5\' 9"  (1.753 m)  Weight: 177 lb (80.287 kg)  SpO2: 100%   Body mass index is 26.13 kg/(m^2).  General:  WDWN in NAD Gait: Normal HENT: WNL Eyes: Pupils equal Pulmonary: normal non-labored breathing , without Rales, rhonchi,  wheezing Cardiac: RRR, no murmur detected  Abdomen: soft, NT, no masses palpated Skin: no rashes, ulcers noted;  no Gangrene , no cellulitis; no open wounds.   VASCULAR EXAM  Carotid Bruits Right Left   Negative Positive      Radial pulses are 2+ palpable and =                      Abdominal aorta is slightly palpable   VASCULAR EXAM: Extremities without ischemic changes  without Gangrene; without open wounds.                                                                                                           LE Pulses Right Left       FEMORAL   palpable   palpable        POPLITEAL  not palpable   not palpable       POSTERIOR TIBIAL  not palpable   not palpable        DORSALIS PEDIS      ANTERIOR TIBIAL 1+ palpable  2+ palpable      Musculoskeletal: no muscle wasting or atrophy; no peripheral edema  Neurologic: A&O X 3; Appropriate Affect ;  SENSATION: normal; MOTOR FUNCTION: 5/5 Symmetric, CN 2-12 intact Speech is fluent/normal    Non-Invasive Vascular Imaging (09/26/2014):   ILIAC ARTERY STENT EVALUATION    INDICATION: Right iliac artery stenosis    PREVIOUS INTERVENTION(S): Right external iliac artery Percutaneous transluminal Angioplasty with stent 02/22/2014.    DUPLEX EXAM:     RIGHT  LEFT   Peak Systolic Velocity (cm/s) Ratio (if abnormal) Waveform  Peak Systolic Velocity (cm/s) Ratio (if abnormal) Waveform  85  B Aorta - Distal     180  B Artery - Proximal to Stent     224  B Stent - Proximal     124  B Stent - Mid     126  B Stent - Distal     128  B Artery - Distal to Stent     0.90/0.84 Today's ABI / TBI 1.05/0.89  1.10/0.90 Previous ABI / TBI (03/22/2014 ) 1.15/0.40    Waveform:    M - Monophasic       B - Biphasic       T - Triphasic  If Ankle Brachial Index (ABI) or Toe Brachial Index (TBI) performed, please see complete report     ADDITIONAL FINDINGS:     IMPRESSION: Patent right external iliac artery stent, no hyperplasia or hemodynamically significant stenosis present.    Compared to the previous exam:  Decrease in right ankle brachial index and stable on the left since study on 03/22/2014.     ASSESSMENT:  Juan Bass is a 64 y.o. male who is  s/p right external iliac artery stent inserted on 02/22/2014. Patient had severe right leg claudication symptoms which have been completely relieved. He is able to ambulate any distance without discomfort. He is on aspirin and Plavix. He continues to smoke and has done so for 40+ years. He has  controlled DM. He has no history of coronary artery disease or cerebrovascular disease. He also appears to have a small abdominal aortic aneurysm which was noted at the time of his angiogram.  Today's iliac artery stent Duplex reveals a patent right external iliac artery stent, no hyperplasia or hemodynamically significant stenosis present. Decrease in right ankle brachial index and stable on the left since study on 03/22/2014.  Left carotid artery bruit auscultated, no carotid Duplex result on file, pt states he has not had an [place] of his neck, he has no history of stroke or TIA.  Face to face time with patient was 25 minutes. Over 50% of this time was spent on counseling and coordination of care.    PLAN:   Based on today's exam and non-invasive vascular lab results, the patient will follow up in 6 months with the following tests: AAA Duplex, right iliac artery stent Duplex, ABI's, and carotid Duplex.  Graduated walking program.  The patient was counseled re smoking cessation and given several free resources re smoking cessation.  I discussed in depth with the patient the nature of atherosclerosis, and emphasized the importance of maximal medical management including strict control of blood pressure, blood glucose, and lipid levels, obtaining regular exercise, and cessation of smoking.  The patient is aware that without maximal medical management the underlying atherosclerotic disease process will progress, limiting the benefit of any interventions. Consideration for repair of AAA would be made when the size approaches 4.8 or 5.0 cm, growth > 1 cm/yr, and symptomatic status. The patient was given information about stroke prevention and what symptoms should prompt the patient to seek immediate medical care. The patient was given information about AAA including signs, symptoms, treatment,  what symptoms should prompt the patient to seek immediate medical care, and how to minimize the risk of  enlargement and rupture of aneurysms. The patient was given information about PAD including signs, symptoms, treatment, what symptoms should prompt the patient to seek immediate medical care, and risk reduction measures to take. Thank you for allowing [place] to participate in this patient's care.  Clemon Chambers, RN, MSN, FNP-C [facility] Office: [phone]  Clinic MD: Trula Slade 09/26/2014 10:38 AM

## 2014-09-26 NOTE — Patient Instructions (Signed)
Abdominal Aortic Aneurysm An aneurysm is a weakened or damaged part of an artery wall that bulges from the normal force of blood pumping through the body. An abdominal aortic aneurysm is an aneurysm that occurs in the lower part of the aorta, the main artery of the body.  The major concern with an abdominal aortic aneurysm is that it can enlarge and burst (rupture) or blood can flow between the layers of the wall of the aorta through a tear (aorticdissection). Both of these conditions can cause bleeding inside the body and can be life threatening unless diagnosed and treated promptly. CAUSES  The exact cause of an abdominal aortic aneurysm is unknown. Some contributing factors are:   A hardening of the arteries caused by the buildup of fat and other substances in the lining of a blood vessel (arteriosclerosis).  Inflammation of the walls of an artery (arteritis).   Connective tissue diseases, such as Marfan syndrome.   Abdominal trauma.   An infection, such as syphilis or staphylococcus, in the wall of the aorta (infectious aortitis) caused by bacteria. RISK FACTORS  Risk factors that contribute to an abdominal aortic aneurysm may include:  Age older than 60 years.   High blood pressure (hypertension).  Male gender.  Ethnicity (white race).  Obesity.  Family history of aneurysm (first degree relatives only).  Tobacco use. PREVENTION  The following healthy lifestyle habits may help decrease your risk of abdominal aortic aneurysm:  Quitting smoking. Smoking can raise your blood pressure and cause arteriosclerosis.  Limiting or avoiding alcohol.  Keeping your blood pressure, blood sugar level, and cholesterol levels within normal limits.  Decreasing your salt intake. In somepeople, too much salt can raise blood pressure and increase your risk of abdominal aortic aneurysm.  Eating a diet low in saturated fats and cholesterol.  Increasing your fiber intake by including  whole grains, vegetables, and fruits in your diet. Eating these foods may help lower blood pressure.  Maintaining a healthy weight.  Staying physically active and exercising regularly. SYMPTOMS  The symptoms of abdominal aortic aneurysm may vary depending on the size and rate of growth of the aneurysm.Most grow slowly and do not have any symptoms. When symptoms do occur, they may include:  Pain (abdomen, side, lower back, or groin). The pain may vary in intensity. A sudden onset of severe pain may indicate that the aneurysm has ruptured.  Feeling full after eating only small amounts of food.  Nausea or vomiting or both.  Feeling a pulsating lump in the abdomen.  Feeling faint or passing out. DIAGNOSIS  Since most unruptured abdominal aortic aneurysms have no symptoms, they are often discovered during diagnostic exams for other conditions. An aneurysm may be found during the following procedures:  Ultrasonography (A one-time screening for abdominal aortic aneurysm by ultrasonography is also recommended for all men aged 65-75 years who have ever smoked).  X-ray exams.  A computed tomography (CT).  Magnetic resonance imaging (MRI).  Angiography or arteriography. TREATMENT  Treatment of an abdominal aortic aneurysm depends on the size of your aneurysm, your age, and risk factors for rupture. Medication to control blood pressure and pain may be used to manage aneurysms smaller than 6 cm. Regular monitoring for enlargement may be recommended by your caregiver if:  The aneurysm is 3-4 cm in size (an annual ultrasonography may be recommended).  The aneurysm is 4-4.5 cm in size (an ultrasonography every 6 months may be recommended).  The aneurysm is larger than 4.5 cm in   size (your caregiver may ask that you be examined by a vascular surgeon). If your aneurysm is larger than 6 cm, surgical repair may be recommended. There are two main methods for repair of an aneurysm:   Endovascular  repair (a minimally invasive surgery). This is done most often.  Open repair. This method is used if an endovascular repair is not possible. Document Released: 04/03/2005 Document Revised: 10/19/2012 Document Reviewed: 07/24/2012 [facility] Patient Information 2015 [place], [place]. This information is not intended to replace advice given to you by your health care provider. Make sure you discuss any questions you have with your health care provider.    Peripheral Vascular Disease Peripheral Vascular Disease (PVD), also called Peripheral Arterial Disease (PAD), is a circulation problem caused by cholesterol (atherosclerotic plaque) deposits in the arteries. PVD commonly occurs in the lower extremities (legs) but it can occur in other areas of the body, such as your arms. The cholesterol buildup in the arteries reduces blood flow which can cause pain and other serious problems. The presence of PVD can place a person at risk for Coronary Artery Disease (CAD).  CAUSES  Causes of PVD can be many. It is usually associated with more than one risk factor such as:   High Cholesterol.  Smoking.  Diabetes.  Lack of exercise or inactivity.  High blood pressure (hypertension).  Obesity.  Family history. SYMPTOMS   When the lower extremities are affected, patients with PVD may experience:  Leg pain with exertion or physical activity. This is called INTERMITTENT CLAUDICATION. This may present as cramping or numbness with physical activity. The location of the pain is associated with the level of blockage. For example, blockage at the abdominal level (distal abdominal aorta) may result in buttock or hip pain. Lower leg arterial blockage may result in calf pain.  As PVD becomes more severe, pain can develop with less physical activity.  In people with severe PVD, leg pain may occur at rest.  Other PVD signs and symptoms:  Leg numbness or weakness.  Coldness in the affected leg or foot,  especially when compared to the other leg.  A change in leg color.  Patients with significant PVD are more prone to ulcers or sores on toes, feet or legs. These may take longer to heal or may reoccur. The ulcers or sores can become infected.  If signs and symptoms of PVD are ignored, gangrene may occur. This can result in the loss of toes or loss of an entire limb.  Not all leg pain is related to PVD. Other medical conditions can cause leg pain such as:  Blood clots (embolism) or Deep Vein Thrombosis.  Inflammation of the blood vessels (vasculitis).  Spinal stenosis. DIAGNOSIS  Diagnosis of PVD can involve several different types of tests. These can include:  Pulse Volume Recording Method (PVR). This test is simple, painless and does not involve the use of X-rays. PVR involves measuring and comparing the blood pressure in the arms and legs. An ABI (Ankle-Brachial Index) is calculated. The normal ratio of blood pressures is 1. As this number becomes smaller, it indicates more severe disease.  < 0.95 - indicates significant narrowing in one or more leg vessels.  <0.8 - there will usually be pain in the foot, leg or buttock with exercise.  <0.4 - will usually have pain in the legs at rest.  <0.25 - usually indicates limb threatening PVD.  Doppler detection of pulses in the legs. This test is painless and checks to see  if you have a pulses in your legs/feet.  A dye or contrast material (a substance that highlights the blood vessels so they show up on x-ray) may be given to help your caregiver better see the arteries for the following tests. The dye is eliminated from your body by the kidney's. Your caregiver may order blood work to check your kidney function and other laboratory values before the following tests are performed:  Magnetic Resonance Angiography (MRA). An MRA is a picture study of the blood vessels and arteries. The MRA machine uses a large magnet to produce images of the  blood vessels.  Computed Tomography Angiography (CTA). A CTA is a specialized x-ray that looks at how the blood flows in your blood vessels. An IV may be inserted into your arm so contrast dye can be injected.  Angiogram. Is a procedure that uses x-rays to look at your blood vessels. This procedure is minimally invasive, meaning a small incision (cut) is made in your groin. A small tube (catheter) is then inserted into the artery of your groin. The catheter is guided to the blood vessel or artery your caregiver wants to examine. Contrast dye is injected into the catheter. X-rays are then taken of the blood vessel or artery. After the images are obtained, the catheter is taken out. TREATMENT  Treatment of PVD involves many interventions which may include:  Lifestyle changes:  Quitting smoking.  Exercise.  Following a low fat, low cholesterol diet.  Control of diabetes.  Foot care is very important to the PVD patient. Good foot care can help prevent infection.  Medication:  Cholesterol-lowering medicine.  Blood pressure medicine.  Anti-platelet drugs.  Certain medicines may reduce symptoms of Intermittent Claudication.  Interventional/Surgical options:  Angioplasty. An Angioplasty is a procedure that inflates a balloon in the blocked artery. This opens the blocked artery to improve blood flow.  Stent Implant. A wire mesh tube (stent) is placed in the artery. The stent expands and stays in place, allowing the artery to remain open.  Peripheral Bypass Surgery. This is a surgical procedure that reroutes the blood around a blocked artery to help improve blood flow. This type of procedure may be performed if Angioplasty or stent implants are not an option. SEEK IMMEDIATE MEDICAL CARE IF:   You develop pain or numbness in your arms or legs.  Your arm or leg turns cold, becomes blue in color.  You develop redness, warmth, swelling and pain in your arms or legs. MAKE SURE YOU:    Understand these instructions.  Will watch your condition.  Will get help right away if you are not doing well or get worse. Document Released: 08/01/2004 Document Revised: 09/16/2011 Document Reviewed: 06/28/2008 [facility] Patient Information 2015 [place], [place]. This information is not intended to replace advice given to you by your health care provider. Make sure you discuss any questions you have with your health care provider.    Smoking Cessation Quitting smoking is important to your health and has many advantages. However, it is not always easy to quit since nicotine is a very addictive drug. Oftentimes, people try 3 times or more before being able to quit. This document explains the best ways for you to prepare to quit smoking. Quitting takes hard work and a lot of effort, but you can do it. ADVANTAGES OF QUITTING SMOKING  You will live longer, feel better, and live better.  Your body will feel the impact of quitting smoking almost immediately.  Within 20 minutes, blood  pressure decreases. Your pulse returns to its normal level.  After 8 hours, carbon monoxide levels in the blood return to normal. Your oxygen level increases.  After 24 hours, the chance of having a heart attack starts to decrease. Your breath, hair, and body stop smelling like smoke.  After 48 hours, damaged nerve endings begin to recover. Your sense of taste and smell improve.  After 72 hours, the body is virtually free of nicotine. Your bronchial tubes relax and breathing becomes easier.  After 2 to 12 weeks, lungs can hold more air. Exercise becomes easier and circulation improves.  The risk of having a heart attack, stroke, cancer, or lung disease is greatly reduced.  After 1 year, the risk of coronary heart disease is cut in half.  After 5 years, the risk of stroke falls to the same as a nonsmoker.  After 10 years, the risk of lung cancer is cut in half and the risk of other cancers decreases  significantly.  After 15 years, the risk of coronary heart disease drops, usually to the level of a nonsmoker.  If you are pregnant, quitting smoking will improve your chances of having a healthy baby.  The people you live with, especially any children, will be healthier.  You will have extra money to spend on things other than cigarettes. QUESTIONS TO THINK ABOUT BEFORE ATTEMPTING TO QUIT You may want to talk about your answers with your health care provider.  Why do you want to quit?  If you tried to quit in the past, what helped and what did not?  What will be the most difficult situations for you after you quit? How will you plan to handle them?  Who can help you through the tough times? Your family? Friends? A health care provider?  What pleasures do you get from smoking? What ways can you still get pleasure if you quit? Here are some questions to ask your health care provider:  How can you help me to be successful at quitting?  What medicine do you think would be best for me and how should I take it?  What should I do if I need more help?  What is smoking withdrawal like? How can I get information on withdrawal? GET READY  Set a quit date.  Change your environment by getting rid of all cigarettes, ashtrays, matches, and lighters in your home, car, or work. Do not let people smoke in your home.  Review your past attempts to quit. Think about what worked and what did not. GET SUPPORT AND ENCOURAGEMENT You have a better chance of being successful if you have help. You can get support in many ways.  Tell your family, friends, and coworkers that you are going to quit and need their support. Ask them not to smoke around you.  Get individual, group, or telephone counseling and support. Programs are available at [employer] and health centers. Call your local health department for information about programs in your area.  Spiritual beliefs and practices may help some  smokers quit.  Download a "quit meter" on your computer to keep track of quit statistics, such as how long you have gone without smoking, cigarettes not smoked, and money saved.  Get a self-help book about quitting smoking and staying off tobacco. [place] yourself from urges to smoke. Talk to someone, go for a walk, or occupy your time with a task.  Change your normal routine. Take a different route  to work. Drink tea instead of coffee. Eat breakfast in a different place.  Reduce your stress. Take a hot bath, exercise, or read a book.  Plan something enjoyable to do every day. Reward yourself for not smoking.  Explore interactive web-based programs that specialize in helping you quit. GET MEDICINE AND USE IT CORRECTLY Medicines can help you stop smoking and decrease the urge to smoke. Combining medicine with the above behavioral methods and support can greatly increase your chances of successfully quitting smoking.  Nicotine replacement therapy helps deliver nicotine to your body without the negative effects and risks of smoking. Nicotine replacement therapy includes nicotine gum, lozenges, inhalers, nasal sprays, and skin patches. Some may be available over-the-counter and others require a prescription.  Antidepressant medicine helps people abstain from smoking, but how this works is unknown. This medicine is available by prescription.  Nicotinic receptor partial agonist medicine simulates the effect of nicotine in your brain. This medicine is available by prescription. Ask your health care provider for advice about which medicines to use and how to use them based on your health history. Your health care provider will tell you what side effects to look out for if you choose to be on a medicine or therapy. Carefully read the information on the package. Do not use any other product containing nicotine while using a nicotine replacement product.  RELAPSE OR  DIFFICULT SITUATIONS Most relapses occur within the first 3 months after quitting. Do not be discouraged if you start smoking again. Remember, most people try several times before finally quitting. You may have symptoms of withdrawal because your body is used to nicotine. You may crave cigarettes, be irritable, feel very hungry, cough often, get headaches, or have difficulty concentrating. The withdrawal symptoms are only temporary. They are strongest when you first quit, but they will go away within 10-14 days. To reduce the chances of relapse, try to:  Avoid drinking alcohol. Drinking lowers your chances of successfully quitting.  Reduce the amount of caffeine you consume. Once you quit smoking, the amount of caffeine in your body increases and can give you symptoms, such as a rapid heartbeat, sweating, and anxiety.  Avoid smokers because they can make you want to smoke.  Do not let weight gain distract you. Many smokers will gain weight when they quit, usually less than 10 pounds. Eat a healthy diet and stay active. You can always lose the weight gained after you quit.  Find ways to improve your mood other than smoking. FOR MORE INFORMATION  www.smokefree.gov  Document Released: 06/18/2001 Document Revised: 11/08/2013 Document Reviewed: 10/03/2011 [facility] Patient Information 2015 [place], [place]. This information is not intended to replace advice given to you by your health care provider. Make sure you discuss any questions you have with your health care provider.   Smoking Cessation, Tips for Success If you are ready to quit smoking, congratulations! You have chosen to help yourself be healthier. Cigarettes bring nicotine, tar, carbon monoxide, and other irritants into your body. Your lungs, heart, and blood vessels will be able to work better without these poisons. There are many different ways to quit smoking. Nicotine gum, nicotine patches, a nicotine inhaler, or nicotine nasal spray can  help with physical craving. Hypnosis, support groups, and medicines help break the habit of smoking. WHAT THINGS CAN I DO TO MAKE QUITTING EASIER?  Here are some tips to help you quit for good:  Pick a date when you will quit smoking completely. Tell all of your  friends and family about your plan to quit on that date.  Do not try to slowly cut down on the number of cigarettes you are smoking. Pick a quit date and quit smoking completely starting on that day.  Throw away all cigarettes.   Clean and remove all ashtrays from your home, work, and car.  On a card, write down your reasons for quitting. Carry the card with you and read it when you get the urge to smoke.  Cleanse your body of nicotine. Drink enough water and fluids to keep your urine clear or pale yellow. Do this after quitting to flush the nicotine from your body.  Learn to predict your moods. Do not let a bad situation be your excuse to have a cigarette. Some situations in your life might tempt you into wanting a cigarette.  Never have "just one" cigarette. It leads to wanting another and another. Remind yourself of your decision to quit.  Change habits associated with smoking. If you smoked while driving or when feeling stressed, try other activities to replace smoking. Stand up when drinking your coffee. Brush your teeth after eating. Sit in a different chair when you read the paper. Avoid alcohol while trying to quit, and try to drink fewer caffeinated beverages. Alcohol and caffeine may urge you to smoke.  Avoid foods and drinks that can trigger a desire to smoke, such as sugary or spicy foods and alcohol.  Ask people who smoke not to smoke around you.  Have something planned to do right after eating or having a cup of coffee. For example, plan to take a walk or exercise.  Try a relaxation exercise to calm you down and decrease your stress. Remember, you may be tense and nervous for the first 2 weeks after you quit, but  this will pass.  Find new activities to keep your hands busy. Play with a pen, coin, or rubber band. Doodle or draw things on paper.  Brush your teeth right after eating. This will help cut down on the craving for the taste of tobacco after meals. You can also try mouthwash.   Use oral substitutes in place of cigarettes. Try using lemon drops, carrots, cinnamon sticks, or chewing gum. Keep them handy so they are available when you have the urge to smoke.  When you have the urge to smoke, try deep breathing.  Designate your home as a nonsmoking area.  If you are a heavy smoker, ask your health care provider about a prescription for nicotine chewing gum. It can ease your withdrawal from nicotine.  Reward yourself. Set aside the cigarette money you save and buy yourself something nice.  Look for support from others. Join a support group or smoking cessation program. Ask someone at home or at work to help you with your plan to quit smoking.  Always ask yourself, "Do I need this cigarette or is this just a reflex?" Tell yourself, "Today, I choose not to smoke," or "I do not want to smoke." You are reminding yourself of your decision to quit.  Do not replace cigarette smoking with electronic cigarettes (commonly called e-cigarettes). The safety of e-cigarettes is unknown, and some may contain harmful chemicals.  If you relapse, do not give up! Plan ahead and think about what you will do the next time you get the urge to smoke. HOW WILL I FEEL WHEN I QUIT SMOKING? You may have symptoms of withdrawal because your body is used to nicotine (the addictive substance in  cigarettes). You may crave cigarettes, be irritable, feel very hungry, cough often, get headaches, or have difficulty concentrating. The withdrawal symptoms are only temporary. They are strongest when you first quit but will go away within 10-14 days. When withdrawal symptoms occur, stay in control. Think about your reasons for quitting.  Remind yourself that these are signs that your body is healing and getting used to being without cigarettes. Remember that withdrawal symptoms are easier to treat than the major diseases that smoking can cause.  Even after the withdrawal is over, expect periodic urges to smoke. However, these cravings are generally short lived and will go away whether you smoke or not. Do not smoke! WHAT RESOURCES ARE AVAILABLE TO HELP ME QUIT SMOKING? Your health care provider can direct you to community resources or hospitals for support, which may include:  Group support.  Education.  Hypnosis.  Therapy. Document Released: 03/22/2004 Document Revised: 11/08/2013 Document Reviewed: 12/10/2012 [facility] Patient Information 2015 [place], [place]. This information is not intended to replace advice given to you by your health care provider. Make sure you discuss any questions you have with your health care provider.

## 2015-04-03 ENCOUNTER — Ambulatory Visit (HOSPITAL_COMMUNITY)
Admission: RE | Admit: 2015-04-03 | Discharge: 2015-04-03 | Disposition: A | Discharge status: Home / Self Care | Payer: 59 | Source: Ambulatory Visit | Attending: Family | Admitting: Family

## 2015-04-03 ENCOUNTER — Ambulatory Visit (INDEPENDENT_AMBULATORY_CARE_PROVIDER_SITE_OTHER)
Admission: RE | Admit: 2015-04-03 | Discharge: 2015-04-03 | Disposition: A | Discharge status: Home / Self Care | Payer: 59 | Source: Ambulatory Visit | Attending: Family | Admitting: Family

## 2015-04-03 ENCOUNTER — Other Ambulatory Visit: Payer: Self-pay | Admitting: Family

## 2015-04-03 ENCOUNTER — Ambulatory Visit (HOSPITAL_COMMUNITY): Admission: RE | Admit: 2015-04-03 | Payer: 59 | Source: Ambulatory Visit

## 2015-04-03 DIAGNOSIS — R0989 Other specified symptoms and signs involving the circulatory and respiratory systems: Secondary | ICD-10-CM

## 2015-04-03 DIAGNOSIS — I739 Peripheral vascular disease, unspecified: Secondary | ICD-10-CM

## 2015-04-03 DIAGNOSIS — Z95828 Presence of other vascular implants and grafts: Secondary | ICD-10-CM

## 2015-04-03 DIAGNOSIS — E1151 Type 2 diabetes mellitus with diabetic peripheral angiopathy without gangrene: Secondary | ICD-10-CM

## 2015-04-03 DIAGNOSIS — Z48812 Encounter for surgical aftercare following surgery on the circulatory system: Secondary | ICD-10-CM | POA: Diagnosis not present

## 2015-04-03 DIAGNOSIS — I714 Abdominal aortic aneurysm, without rupture, unspecified: Secondary | ICD-10-CM

## 2015-04-03 DIAGNOSIS — F17200 Nicotine dependence, unspecified, uncomplicated: Secondary | ICD-10-CM

## 2015-04-03 DIAGNOSIS — I6523 Occlusion and stenosis of bilateral carotid arteries: Secondary | ICD-10-CM | POA: Insufficient documentation

## 2015-04-03 DIAGNOSIS — E119 Type 2 diabetes mellitus without complications: Secondary | ICD-10-CM | POA: Diagnosis not present

## 2015-04-11 ENCOUNTER — Ambulatory Visit: Payer: 59 | Admitting: Family

## 2015-04-14 ENCOUNTER — Encounter: Payer: Self-pay | Admitting: Family

## 2015-04-17 ENCOUNTER — Encounter: Payer: Self-pay | Admitting: Family

## 2015-04-17 ENCOUNTER — Ambulatory Visit (INDEPENDENT_AMBULATORY_CARE_PROVIDER_SITE_OTHER): Payer: 59 | Admitting: Family

## 2015-04-17 VITALS — BP 154/83 | HR 69 | Temp 98.0°F | Resp 16 | Ht 68.0 in | Wt 185.0 lb

## 2015-04-17 DIAGNOSIS — I714 Abdominal aortic aneurysm, without rupture, unspecified: Secondary | ICD-10-CM

## 2015-04-17 DIAGNOSIS — Z48812 Encounter for surgical aftercare following surgery on the circulatory system: Secondary | ICD-10-CM | POA: Diagnosis not present

## 2015-04-17 DIAGNOSIS — R0989 Other specified symptoms and signs involving the circulatory and respiratory systems: Secondary | ICD-10-CM

## 2015-04-17 DIAGNOSIS — Z95828 Presence of other vascular implants and grafts: Secondary | ICD-10-CM | POA: Diagnosis not present

## 2015-04-17 DIAGNOSIS — Z720 Tobacco use: Secondary | ICD-10-CM

## 2015-04-17 DIAGNOSIS — I779 Disorder of arteries and arterioles, unspecified: Secondary | ICD-10-CM

## 2015-04-17 DIAGNOSIS — Z8679 Personal history of other diseases of the circulatory system: Secondary | ICD-10-CM

## 2015-04-17 DIAGNOSIS — F17200 Nicotine dependence, unspecified, uncomplicated: Secondary | ICD-10-CM

## 2015-04-17 NOTE — Patient Instructions (Addendum)
Peripheral Vascular Disease Peripheral vascular disease (PVD) is a disease of the blood vessels that are not part of your heart and brain. A simple term for PVD is poor circulation. In most cases, PVD narrows the blood vessels that carry blood from your heart to the rest of your body. This can result in a decreased supply of blood to your arms, legs, and internal organs, like your stomach or kidneys. However, it most often affects a person's lower legs and feet. There are two types of PVD.  Organic PVD. This is the more common type. It is caused by damage to the structure of blood vessels.  Functional PVD. This is caused by conditions that make blood vessels contract and tighten (spasm). Without treatment, PVD tends to get worse over time. PVD can also lead to acute ischemic limb. This is when an arm or limb suddenly has trouble getting enough blood. This is a medical emergency. CAUSES Each type of PVD has many different causes. The most common cause of PVD is buildup of a fatty material (plaque) inside of your arteries (atherosclerosis). Small amounts of plaque can break off from the walls of the blood vessels and become lodged in a smaller artery. This blocks blood flow and can cause acute ischemic limb. Other common causes of PVD include:  Blood clots that form inside of blood vessels.  Injuries to blood vessels.  Diseases that cause inflammation of blood vessels or cause blood vessel spasms.  Health behaviors and health history that increase your risk of developing PVD. RISK FACTORS  You may have a greater risk of PVD if you:  Have a family history of PVD.  Have certain medical conditions, including:  High cholesterol.  Diabetes.  High blood pressure (hypertension).  Coronary heart disease.  Past problems with blood clots.  Past injury, such as burns or a broken bone. These may have damaged blood vessels in your limbs.  Buerger disease. This is caused by inflamed blood  vessels in your hands and feet.  Some forms of arthritis.  Rare birth defects that affect the arteries in your legs.  Use tobacco.  Do not get enough exercise.  Are obese.  Are age 50 or older. SIGNS AND SYMPTOMS  PVD may cause many different symptoms. Your symptoms depend on what part of your body is not getting enough blood. Some common signs and symptoms include:  Cramps in your lower legs. This may be a symptom of poor leg circulation (claudication).  Pain and weakness in your legs while you are physically active that goes away when you rest (intermittent claudication).  Leg pain when at rest.  Leg numbness, tingling, or weakness.  Coldness in a leg or foot, especially when compared with the other leg.  Skin or hair changes. These can include:  Hair loss.  Shiny skin.  Pale or bluish skin.  Thick toenails.  Inability to get or maintain an erection (erectile dysfunction). People with PVD are more prone to developing ulcers and sores on their toes, feet, or legs. These may take longer than normal to heal. DIAGNOSIS Your health care provider may diagnose PVD from your signs and symptoms. The health care provider will also do a physical exam. You may have tests to find out what is causing your PVD and determine its severity. Tests may include:  Blood pressure recordings from your arms and legs and measurements of the strength of your pulses (pulse volume recordings).  Imaging studies using sound waves to take pictures of   the blood flow through your blood vessels (Doppler ultrasound).  Injecting a dye into your blood vessels before having imaging studies using:  X-rays (angiogram or arteriogram).  Computer-generated X-rays (CT angiogram).  A powerful electromagnetic field and a computer (magnetic resonance angiogram or MRA). TREATMENT Treatment for PVD depends on the cause of your condition and the severity of your symptoms. It also depends on your age. Underlying  causes need to be treated and controlled. These include long-lasting (chronic) conditions, such as diabetes, high cholesterol, and high blood pressure. You may need to first try making lifestyle changes and taking medicines. Surgery may be needed if these do not work. Lifestyle changes may include:  Quitting smoking.  Exercising regularly.  Following a low-fat, low-cholesterol diet. Medicines may include:  Blood thinners to prevent blood clots.  Medicines to improve blood flow.  Medicines to improve your blood cholesterol levels. Surgical procedures may include:  A procedure that uses an inflated balloon to open a blocked artery and improve blood flow (angioplasty).  A procedure to put in a tube (stent) to keep a blocked artery open (stent implant).  Surgery to reroute blood flow around a blocked artery (peripheral bypass surgery).  Surgery to remove dead tissue from an infected wound on the affected limb.  Amputation. This is surgical removal of the affected limb. This may be necessary in cases of acute ischemic limb that are not improved through medical or surgical treatments. HOME CARE INSTRUCTIONS  Take medicines only as directed by your health care provider.  Do not use any tobacco products, including cigarettes, chewing tobacco, or electronic cigarettes. If you need help quitting, ask your health care provider.  Lose weight if you are overweight, and maintain a healthy weight as directed by your health care provider.  Eat a diet that is low in fat and cholesterol. If you need help, ask your health care provider.  Exercise regularly. Ask your health care provider to suggest some good activities for you.  Use compression stockings or other mechanical devices as directed by your health care provider.  Take good care of your feet.  Wear comfortable shoes that fit well.  Check your feet often for any cuts or sores. SEEK MEDICAL CARE IF:  You have cramps in your legs  while walking.  You have leg pain when you are at rest.  You have coldness in a leg or foot.  Your skin changes.  You have erectile dysfunction.  You have cuts or sores on your feet that are not healing. SEEK IMMEDIATE MEDICAL CARE IF:  Your arm or leg turns cold and blue.  Your arms or legs become red, warm, swollen, painful, or numb.  You have chest pain or trouble breathing.  You suddenly have weakness in your face, arm, or leg.  You become very confused or lose the ability to speak.  You suddenly have a very bad headache or lose your vision.   This information is not intended to replace advice given to you by your health care provider. Make sure you discuss any questions you have with your health care provider.   Document Released: 08/01/2004 Document Revised: 07/15/2014 Document Reviewed: 12/02/2013 Elsevier Interactive Patient Education 2016 [employer]    Stroke Prevention Some medical conditions and behaviors are associated with an increased chance of having a stroke. You may prevent a stroke by making healthy choices and managing medical conditions. HOW CAN I REDUCE MY RISK OF HAVING A STROKE?   Stay physically active. Get at   least 30 minutes of activity on most or all days.  Do not smoke. It may also be helpful to avoid exposure to secondhand smoke.  Limit alcohol use. Moderate alcohol use is considered to be:  No more than 2 drinks per day for men.  No more than 1 drink per day for nonpregnant women.  Eat healthy foods. This involves:  Eating 5 or more servings of fruits and vegetables a day.  Making dietary changes that address high blood pressure (hypertension), high cholesterol, diabetes, or obesity.  Manage your cholesterol levels.  Making food choices that are high in fiber and low in saturated fat, trans fat, and cholesterol may control cholesterol levels.  Take any prescribed medicines to control cholesterol as directed by your health care  provider.  Manage your diabetes.  Controlling your carbohydrate and sugar intake is recommended to manage diabetes.  Take any prescribed medicines to control diabetes as directed by your health care provider.  Control your hypertension.  Making food choices that are low in salt (sodium), saturated fat, trans fat, and cholesterol is recommended to manage hypertension.  Ask your health care provider if you need treatment to lower your blood pressure. Take any prescribed medicines to control hypertension as directed by your health care provider.  If you are 18-39 years of age, have your blood pressure checked every 3-5 years. If you are 40 years of age or older, have your blood pressure checked every year.  Maintain a healthy weight.  Reducing calorie intake and making food choices that are low in sodium, saturated fat, trans fat, and cholesterol are recommended to manage weight.  Stop drug abuse.  Avoid taking birth control pills.  Talk to your health care provider about the risks of taking birth control pills if you are over 35 years old, smoke, get migraines, or have ever had a blood clot.  Get evaluated for sleep disorders (sleep apnea).  Talk to your health care provider about getting a sleep evaluation if you snore a lot or have excessive sleepiness.  Take medicines only as directed by your health care provider.  For some people, aspirin or blood thinners (anticoagulants) are helpful in reducing the risk of forming abnormal blood clots that can lead to stroke. If you have the irregular heart rhythm of atrial fibrillation, you should be on a blood thinner unless there is a good reason you cannot take them.  Understand all your medicine instructions.  Make sure that other conditions (such as anemia or atherosclerosis) are addressed. SEEK IMMEDIATE MEDICAL CARE IF:   You have sudden weakness or numbness of the face, arm, or leg, especially on one side of the body.  Your face  or eyelid droops to one side.  You have sudden confusion.  You have trouble speaking (aphasia) or understanding.  You have sudden trouble seeing in one or both eyes.  You have sudden trouble walking.  You have dizziness.  You have a loss of balance or coordination.  You have a sudden, severe headache with no known cause.  You have new chest pain or an irregular heartbeat. Any of these symptoms may represent a serious problem that is an emergency. Do not wait to see if the symptoms will go away. Get medical help at once. Call your local emergency services (911 in [place] Do not drive yourself to the hospital.   This information is not intended to replace advice given to you by your health care provider. Make sure you discuss any questions   you have with your health care provider.   Document Released: 08/01/2004 Document Revised: 07/15/2014 Document Reviewed: 12/25/2012 Elsevier Interactive Patient Education 2016 [employer].  Smoking Cessation, Tips for Success If you are ready to quit smoking, congratulations! You have chosen to help yourself be healthier. Cigarettes bring nicotine, tar, carbon monoxide, and other irritants into your body. Your lungs, heart, and blood vessels will be able to work better without these poisons. There are many different ways to quit smoking. Nicotine gum, nicotine patches, a nicotine inhaler, or nicotine nasal spray can help with physical craving. Hypnosis, support groups, and medicines help break the habit of smoking. WHAT THINGS CAN I DO TO MAKE QUITTING EASIER?  Here are some tips to help you quit for good:  Pick a date when you will quit smoking completely. Tell all of your friends and family about your plan to quit on that date.  Do not try to slowly cut down on the number of cigarettes you are smoking. Pick a quit date and quit smoking completely starting on that day.  Throw away all cigarettes.   Clean and remove all ashtrays from your home,  work, and car.  On a card, write down your reasons for quitting. Carry the card with you and read it when you get the urge to smoke.  Cleanse your body of nicotine. Drink enough water and fluids to keep your urine clear or pale yellow. Do this after quitting to flush the nicotine from your body.  Learn to predict your moods. Do not let a bad situation be your excuse to have a cigarette. Some situations in your life might tempt you into wanting a cigarette.  Never have "just one" cigarette. It leads to wanting another and another. Remind yourself of your decision to quit.  Change habits associated with smoking. If you smoked while driving or when feeling stressed, try other activities to replace smoking. Stand up when drinking your coffee. Brush your teeth after eating. Sit in a different chair when you read the paper. Avoid alcohol while trying to quit, and try to drink fewer caffeinated beverages. Alcohol and caffeine may urge you to smoke.  Avoid foods and drinks that can trigger a desire to smoke, such as sugary or spicy foods and alcohol.  Ask people who smoke not to smoke around you.  Have something planned to do right after eating or having a cup of coffee. For example, plan to take a walk or exercise.  Try a relaxation exercise to calm you down and decrease your stress. Remember, you may be tense and nervous for the first 2 weeks after you quit, but this will pass.  Find new activities to keep your hands busy. Play with a pen, coin, or rubber band. Doodle or draw things on paper.  Brush your teeth right after eating. This will help cut down on the craving for the taste of tobacco after meals. You can also try mouthwash.   Use oral substitutes in place of cigarettes. Try using lemon drops, carrots, cinnamon sticks, or chewing gum. Keep them handy so they are available when you have the urge to smoke.  When you have the urge to smoke, try deep breathing.  Designate your home as a  nonsmoking area.  If you are a heavy smoker, ask your health care provider about a prescription for nicotine chewing gum. It can ease your withdrawal from nicotine.  Reward yourself. Set aside the cigarette money you save and buy yourself something nice.  Look for support from others. Join a support group or smoking cessation program. Ask someone at home or at work to help you with your plan to quit smoking.  Always ask yourself, "Do I need this cigarette or is this just a reflex?" Tell yourself, "Today, I choose not to smoke," or "I do not want to smoke." You are reminding yourself of your decision to quit.  Do not replace cigarette smoking with electronic cigarettes (commonly called e-cigarettes). The safety of e-cigarettes is unknown, and some may contain harmful chemicals.  If you relapse, do not give up! Plan ahead and think about what you will do the next time you get the urge to smoke. HOW WILL I FEEL WHEN I QUIT SMOKING? You may have symptoms of withdrawal because your body is used to nicotine (the addictive substance in cigarettes). You may crave cigarettes, be irritable, feel very hungry, cough often, get headaches, or have difficulty concentrating. The withdrawal symptoms are only temporary. They are strongest when you first quit but will go away within 10-14 days. When withdrawal symptoms occur, stay in control. Think about your reasons for quitting. Remind yourself that these are signs that your body is healing and getting used to being without cigarettes. Remember that withdrawal symptoms are easier to treat than the major diseases that smoking can cause.  Even after the withdrawal is over, expect periodic urges to smoke. However, these cravings are generally short lived and will go away whether you smoke or not. Do not smoke! WHAT RESOURCES ARE AVAILABLE TO HELP ME QUIT SMOKING? Your health care provider can direct you to community resources or hospitals for support, which may  include:  Group support.  Education.  Hypnosis.  Therapy.   This information is not intended to replace advice given to you by your health care provider. Make sure you discuss any questions you have with your health care provider.   Document Released: 03/22/2004 Document Revised: 07/15/2014 Document Reviewed: 12/10/2012 Elsevier Interactive Patient Education 2016 [place] Can Quit Smoking If you are ready to quit smoking or are thinking about it, congratulations! You have chosen to help yourself be healthier and live longer! There are lots of different ways to quit smoking. Nicotine gum, nicotine patches, a nicotine inhaler, or nicotine nasal spray can help with physical craving. Hypnosis, support groups, and medicines help break the habit of smoking. TIPS TO GET OFF AND STAY OFF CIGARETTES  Learn to predict your moods. Do not let a bad situation be your excuse to have a cigarette. Some situations in your life might tempt you to have a cigarette.  Ask friends and co-workers not to smoke around you.  Make your home smoke-free.  Never have "just one" cigarette. It leads to wanting another and another. Remind yourself of your decision to quit.  On a card, make a list of your reasons for not smoking. Read it at least the same number of times a day as you have a cigarette. Tell yourself everyday, "I do not want to smoke. I choose not to smoke."  Ask someone at home or work to help you with your plan to quit smoking.  Have something planned after you eat or have a cup of coffee. Take a walk or get other exercise to perk you up. This will help to keep you from overeating.  Try a relaxation exercise to calm you down and decrease your stress. Remember, you may be tense and nervous the first two weeks  after you quit. This will pass.  Find new activities to keep your hands busy. Play with a pen, coin, or rubber band. Doodle or draw things on paper.  Brush your teeth right  after eating. This will help cut down the craving for the taste of tobacco after meals. You can try mouthwash too.  Try gum, breath mints, or diet candy to keep something in your mouth. IF YOU SMOKE AND WANT TO QUIT:  Do not stock up on cigarettes. Never buy a carton. Wait until one pack is finished before you buy another.  Never carry cigarettes with you at work or at home.  Keep cigarettes as far away from you as possible. Leave them with someone else.  Never carry matches or a lighter with you.  Ask yourself, "Do I need this cigarette or is this just a reflex?"  Bet with someone that you can quit. Put cigarette money in a piggy bank every morning. If you smoke, you give up the money. If you do not smoke, by the end of the week, you keep the money.  Keep trying. It takes 21 days to change a habit!  Talk to your doctor about using medicines to help you quit. These include nicotine replacement gum, lozenges, or skin patches.   This information is not intended to replace advice given to you by your health care provider. Make sure you discuss any questions you have with your health care provider.   Document Released: 04/20/2009 Document Revised: 09/16/2011 Document Reviewed: 04/20/2009 Elsevier Interactive Patient Education [employer].

## 2015-04-17 NOTE — Progress Notes (Signed)
Filed Vitals:   04/17/15 0858 04/17/15 0903  BP: 154/83 154/83  Pulse: 68 69  Temp: 98 F (36.7 C)   TempSrc: Oral   Resp: 16   Height: '5\' 8"'$  (1.727 m)   Weight: 185 lb (83.915 kg)   SpO2: 99%

## 2015-04-17 NOTE — Progress Notes (Signed)
[facility] [place] HISTORY AND PHYSICAL   MRN : [id number]  History of Present Illness:   Juan Bass is a 64 y.o. male patient of Dr. Kellie Simmering who returns for follow up s/p right external iliac artery stent inserted by Dr. Trula Slade on 02/22/2014. Patient had severe right leg claudication symptoms which have been completely relieved. He is able to ambulate any distance without discomfort. He is on aspirin and Plavix. He continues to smokeand has done so for 40+ years. He has no history of coronary artery disease or cerebrovascular disease. He also appears to have a small abdominal aortic aneurysm which was noted at the time of his angiogram.  Pt states he has no barriers to walking, does not walk much. Pt denies any history of stroke or TIA.  Pt Diabetic: Yes, states in good control Pt smoker: smoker (1/2 ppd, started at age 83 yrs)  Pt meds include: Statin :Yes ASA: Yes Other anticoagulants/antiplatelets: no    Current Outpatient Prescriptions  Medication Sig Dispense Refill  . amLODipine (NORVASC) 10 MG tablet Take 10 mg by mouth daily.    Marland Kitchen aspirin EC 81 MG tablet Take 81 mg by mouth daily.    Marland Kitchen atorvastatin (LIPITOR) 20 MG tablet Take 20 mg by mouth at bedtime.     Marland Kitchen glyBURIDE (DIABETA) 5 MG tablet Take 5 mg by mouth daily with breakfast.    . labetalol (NORMODYNE) 100 MG tablet Take 100 mg by mouth 2 (two) times daily. Two Tablets twice daily    . losartan (COZAAR) 100 MG tablet Take 100 mg by mouth daily.     No current facility-administered medications for this visit.    Past Medical History  Diagnosis Date  . Diabetes mellitus without complication     Social History Social History  Substance Use Topics  . Smoking status: Light Tobacco Smoker -- 1.00 packs/day for 40 years    Types: Cigarettes  . Smokeless tobacco: Never Used  . Alcohol Use: 0.6 - 1.2 oz/week    1-2 Cans of beer per week    Family History Family History  Problem  Relation Age of Onset  . Hypertension Mother   . Deep vein thrombosis Father   . Hypertension Father   . Hypertension Sister   . Hypertension Brother     Surgical History Past Surgical History  Procedure Laterality Date  . Back surgery    . Abdominal aortagram N/A 02/22/2014    Procedure: ABDOMINAL Maxcine Ham;  Surgeon: Serafina Mitchell, MD;  Location: [facility] CATH LAB;  Service: Cardiovascular;  Laterality: N/A;  . Spine surgery      No Known Allergies  Current Outpatient Prescriptions  Medication Sig Dispense Refill  . amLODipine (NORVASC) 10 MG tablet Take 10 mg by mouth daily.    Marland Kitchen aspirin EC 81 MG tablet Take 81 mg by mouth daily.    Marland Kitchen atorvastatin (LIPITOR) 20 MG tablet Take 20 mg by mouth at bedtime.     Marland Kitchen glyBURIDE (DIABETA) 5 MG tablet Take 5 mg by mouth daily with breakfast.    . labetalol (NORMODYNE) 100 MG tablet Take 100 mg by mouth 2 (two) times daily. Two Tablets twice daily    . losartan (COZAAR) 100 MG tablet Take 100 mg by mouth daily.     No current facility-administered medications for this visit.     REVIEW OF SYSTEMS: See HPI for pertinent positives and negatives.  Physical Examination Filed Vitals:   04/17/15 7829 04/17/15 5621  BP: 154/83 154/83  Pulse: 68 69  Temp: 98 F (36.7 C)   TempSrc: Oral   Resp: 16   Height: '5\' 8"'$  (1.727 m)   Weight: 185 lb (83.915 kg)   SpO2: 99%    Body mass index is 28.14 kg/(m^2).   General: WDWN in NAD Gait: Normal HENT: WNL Eyes: Pupils equal Pulmonary: normal non-labored breathing , without Rales, rhonchi, wheezing Cardiac: RRR, no murmur detected  Abdomen: soft, NT, no masses palpated Skin: no rashes, no ulcers, no cellulitis.   VASCULAR EXAM  Carotid Bruits Right Left   Negative Positive   Radial pulses are 2+ palpable and =  Abdominal aorta is slightly palpable   VASCULAR EXAM: Extremities without ischemic changes  without Gangrene; without open  wounds.     LE Pulses Right Left   FEMORAL  palpable  palpable    POPLITEAL not palpable  not palpable   POSTERIOR TIBIAL not palpable  not palpable    DORSALIS PEDIS  ANTERIOR TIBIAL 1+ palpable  2+ palpable      Musculoskeletal: no muscle wasting or atrophy; no peripheral edema Neurologic: A&O X 3; Appropriate Affect ;  SENSATION: normal; MOTOR FUNCTION: 5/5 Symmetric, CN 2-12 intact Speech is fluent/normal         Non-Invasive Vascular Imaging (04/03/15):  CEREBROVASCULAR DUPLEX EVALUATION    INDICATION: Bruit    PREVIOUS INTERVENTION(S): NA    DUPLEX EXAM:     RIGHT  LEFT  Peak Systolic Velocities (cm/s) End Diastolic Velocities (cm/s) Plaque LOCATION Peak Systolic Velocities (cm/s) End Diastolic Velocities (cm/s) Plaque  85 21  CCA PROXIMAL 90 21   95 24 HT CCA MID 88/131 23/33 HT  97 25 HT CCA DISTAL 165 31 HT  105 15 HT ECA 113 15 HT  105 30 HT ICA PROXIMAL 72 17 HT  89 29  ICA MID 82 26 HT  123 27  ICA DISTAL 73 24     1.11 ICA / CCA Ratio (PSV) 0.82  Antegrade Vertebral Flow Abnormal/Antegrade  160 Brachial Systolic Pressure (mmHg) 737  Triphasic Brachial Artery Waveforms Triphasic    Plaque Morphology:  HM = Homogeneous, HT = Heterogeneous, CP = Calcific Plaque, SP = Smooth Plaque, IP = Irregular Plaque     ADDITIONAL FINDINGS: Right subclavian artery PSV 137cm/sec; Left subclavian artery PSV107cm/sec    IMPRESSION: Left common carotid artery disease present of less than 50%. Bilateral internal carotid artery stenosis present in the less than 40% range.    Compared to the previous exam:  No previous study to compare.     ILIAC ARTERY STENT EVALUATION    INDICATION: Peripheral vascular disease ; abdominal aortic aneurysm     PREVIOUS INTERVENTION(S):  Right external iliac percutaneous transluminal angioplasty w/stent 02/22/2014.    DUPLEX EXAM:     RIGHT  LEFT   Peak Systolic Velocity (cm/s) Ratio (if abnormal) Waveform  Peak Systolic Velocity (cm/s) Ratio (if abnormal) Waveform  84  B Aorta - Distal     97/238 2.5 B/B Artery - Proximal to Stent     NA  NA Stent - Proximal     176  B Stent - Mid     116  B Stent - Distal     108  B Artery - Distal to Stent     0.93/0.86 Today's ABI / TBI 1.06/0.82  0.90/0.84 Previous ABI / TBI (09/26/2014 ) 1.05/0.89    Waveform:    M - Monophasic  B - Biphasic       T - Triphasic  If Ankle Brachial Index (ABI) or Toe Brachial Index (TBI) performed, please see complete report     ADDITIONAL FINDINGS:     IMPRESSION: Abdominal Aortic Aneurysm present measuring 4.01cm AP x 4.01cm TRV, with non-occluding intramural thrombus present. Technically difficult and limited exam of the right iliac artery system due to presence of extensive bowel gas and acoustic shadowing present, unable to visualize proximal stent. Elevated velocity present at the proximal common iliac artery suggestive of 50%-74% stenosis, with heterogeneous plaque visualized.    Compared to the previous exam:  Stable ankle and toe brachial indices since study on 09/26/2014.       ASSESSMENT:  Juan Bass is a 64 y.o. male who is s/p right external iliac artery stent inserted on 02/22/2014. Patient had severe right leg claudication symptoms which have been completely relieved. He is able to ambulate any distance without discomfort. He is on aspirin and Plavix. He continues to smoke and has done so for 40+ years. He has controlled DM. He has no history of coronary artery disease or cerebrovascular disease. He also appears to have a small abdominal aortic aneurysm which was noted at the time of his angiogram.  He has a bruit of his left carotid artery. 04/03/15 carotid duplex suggests left common carotid artery disease present  of less than 50%, bilateral internal carotid artery stenosis less than 40%. No previous study for comparison.   AAA largest diameter on 03/22/14 was 3.6 cm (review of records).  04/03/15 aortoiliac duplex suggests abdominal aortic aneurysm present measuring 4.01cm AP x 4.01cm TRV, with non-occluding intramural thrombus present; 0.4 cm growth in a year. Technically difficult and limited exam of the right iliac artery system due to presence of extensive bowel gas and acoustic shadowing present, unable to visualize proximal stent. Elevated velocity present at the proximal common iliac artery suggestive of 50%-74% stenosis, with heterogeneous plaque visualized. Stable and normal ankle and toe brachial indices since study on 09/26/2014. Fortunately his DM seems in good control; unfortunately he continues to smoke, but seems somewhat receptive to smoking cessation counseling; He was given several free resources re smoking cessation.  Face to face time with patient was 25 minutes. Over 50% of this time was spent on counseling and coordination of care.   PLAN:   Graduated walking program, discussed how to execute such with pt.  Based on today's exam and non-invasive vascular lab results, the patient will follow up in 6 months with the following tests: AAA Duplex, right iliac artery stent Duplex, and ABI's,  carotid Duplex in a year. I discussed in depth with the patient the nature of atherosclerosis, and emphasized the importance of maximal medical management including strict control of blood pressure, blood glucose, and lipid levels, obtaining regular exercise, and cessation of smoking.  The patient is aware that without maximal medical management the underlying atherosclerotic disease process will progress, limiting the benefit of any interventions. Consideration for repair of AAA would be made when the size approaches 4.8 or 5.0 cm, growth > 1 cm/yr, and symptomatic status. The patient was given  information about stroke prevention and what symptoms should prompt the patient to seek immediate medical care. The patient was given information about AAA including signs, symptoms, treatment,  what symptoms should prompt the patient to seek immediate medical care, and how to minimize the risk of enlargement and rupture of aneurysms. The patient was given information about PAD including  signs, symptoms, treatment, what symptoms should prompt the patient to seek immediate medical care, and risk reduction measures to take. Thank you for allowing [place] to participate in this patient's care.  Clemon Chambers, RN, MSN, FNP-C [facility] Office: [phone]  Clinic MD: Trula Slade  04/17/2015 8:55 AM

## 2015-10-18 ENCOUNTER — Inpatient Hospital Stay (HOSPITAL_COMMUNITY)
Admission: RE | Admit: 2015-10-18 | Discharge: 2015-10-18 | Disposition: A | Discharge status: Home / Self Care | Payer: Self-pay | Source: Ambulatory Visit | Attending: Family | Admitting: Family

## 2015-10-18 ENCOUNTER — Encounter (HOSPITAL_COMMUNITY): Payer: Self-pay

## 2015-10-18 ENCOUNTER — Ambulatory Visit: Payer: Self-pay | Admitting: Family

## 2015-10-18 DIAGNOSIS — I714 Abdominal aortic aneurysm, without rupture, unspecified: Secondary | ICD-10-CM

## 2015-10-18 DIAGNOSIS — Z95828 Presence of other vascular implants and grafts: Secondary | ICD-10-CM

## 2015-12-05 ENCOUNTER — Encounter: Payer: Self-pay | Admitting: Family

## 2015-12-13 ENCOUNTER — Encounter (HOSPITAL_COMMUNITY): Payer: Self-pay

## 2015-12-13 ENCOUNTER — Ambulatory Visit: Payer: Self-pay | Admitting: Family

## 2016-01-19 ENCOUNTER — Encounter: Payer: Self-pay | Admitting: Family

## 2016-01-24 ENCOUNTER — Encounter: Payer: Self-pay | Admitting: Family

## 2016-01-24 ENCOUNTER — Ambulatory Visit (INDEPENDENT_AMBULATORY_CARE_PROVIDER_SITE_OTHER): Payer: BLUE CROSS/BLUE SHIELD | Admitting: Family

## 2016-01-24 ENCOUNTER — Ambulatory Visit (HOSPITAL_COMMUNITY)
Admission: RE | Admit: 2016-01-24 | Discharge: 2016-01-24 | Disposition: A | Discharge status: Home / Self Care | Payer: BLUE CROSS/BLUE SHIELD | Source: Ambulatory Visit | Attending: Family | Admitting: Family

## 2016-01-24 VITALS — BP 122/80 | HR 66 | Temp 97.6°F | Resp 14 | Ht 69.0 in | Wt 182.0 lb

## 2016-01-24 DIAGNOSIS — Z8679 Personal history of other diseases of the circulatory system: Secondary | ICD-10-CM

## 2016-01-24 DIAGNOSIS — Z95828 Presence of other vascular implants and grafts: Secondary | ICD-10-CM | POA: Diagnosis not present

## 2016-01-24 DIAGNOSIS — I779 Disorder of arteries and arterioles, unspecified: Secondary | ICD-10-CM | POA: Diagnosis not present

## 2016-01-24 DIAGNOSIS — E1151 Type 2 diabetes mellitus with diabetic peripheral angiopathy without gangrene: Secondary | ICD-10-CM

## 2016-01-24 DIAGNOSIS — I714 Abdominal aortic aneurysm, without rupture, unspecified: Secondary | ICD-10-CM

## 2016-01-24 DIAGNOSIS — F17200 Nicotine dependence, unspecified, uncomplicated: Secondary | ICD-10-CM

## 2016-01-24 DIAGNOSIS — E119 Type 2 diabetes mellitus without complications: Secondary | ICD-10-CM | POA: Insufficient documentation

## 2016-01-24 DIAGNOSIS — Z720 Tobacco use: Secondary | ICD-10-CM | POA: Diagnosis not present

## 2016-01-24 DIAGNOSIS — R0989 Other specified symptoms and signs involving the circulatory and respiratory systems: Secondary | ICD-10-CM

## 2016-01-24 NOTE — Progress Notes (Signed)
[facility] [place]   CC: Follow up Abdominal Aortic Aneurysm  History of Present Illness  Juan Bass is a 65 y.o. (June 16, 1951) male patient of Dr. Kellie Simmering who returns for follow up s/p right external iliac artery stent inserted by Dr. Trula Slade on 02/22/2014. Patient had severe right leg claudication symptoms which had been completely relieved.  He is on aspirin and Plavix. He continues to smokeand has done so for 40+ years. He has no history of coronary artery disease or cerebrovascular disease. He also appears to have a small abdominal aortic aneurysm which was noted at the time of his angiogram He has a tired feeling in both buttocks after walking about 1/4 mile, relieved by rest; this started about 2014 or 2015.  Pt states he has no barriers to walking, does not walk much. Pt denies any history of stroke or TIA.  Pt Diabetic: Yes, states in good control, no recent results on file Pt smoker: smoker (3/4 ppd, started at age 71 yrs)  Pt meds include: Statin :no, pt states his PCP "did not put me back on it" ASA: Yes Other anticoagulants/antiplatelets: no   Past Medical History  Diagnosis Date  . Diabetes mellitus without complication [facility])    Past Surgical History  Procedure Laterality Date  . Back surgery    . Abdominal aortagram N/A 02/22/2014    Procedure: ABDOMINAL Maxcine Ham;  Surgeon: Serafina Mitchell, MD;  Location: [facility] CATH LAB;  Service: Cardiovascular;  Laterality: N/A;  . Spine surgery     Social History Social History   Social History  . Marital Status: Married    Spouse Name: N/A  . Number of Children: N/A  . Years of Education: N/A   Occupational History  . Not on file.   Social History Main Topics  . Smoking status: Current Every Day Smoker -- 0.75 packs/day for 40 years    Types: Cigarettes  . Smokeless tobacco: Never Used  . Alcohol Use: 0.6 - 1.2 oz/week    1-2 Cans of beer per week  . Drug Use: No  . Sexual Activity: Not  on file   Other Topics Concern  . Not on file   Social History Narrative   Family History Family History  Problem Relation Age of Onset  . Hypertension Mother   . Deep vein thrombosis Father   . Hypertension Father   . Hypertension Sister   . Hypertension Brother     Current Outpatient Prescriptions on File Prior to Visit  Medication Sig Dispense Refill  . amLODipine (NORVASC) 10 MG tablet Take 10 mg by mouth daily.    Marland Kitchen aspirin EC 81 MG tablet Take 81 mg by mouth daily.    Marland Kitchen glyBURIDE (DIABETA) 5 MG tablet Take 5 mg by mouth daily with breakfast.    . labetalol (NORMODYNE) 100 MG tablet Take 200 mg by mouth 2 (two) times daily.     Marland Kitchen losartan (COZAAR) 100 MG tablet Take 100 mg by mouth daily.    Marland Kitchen atorvastatin (LIPITOR) 20 MG tablet Take 20 mg by mouth at bedtime. Reported on 01/24/2016     No current facility-administered medications on file prior to visit.   No Known Allergies  ROS: See HPI for pertinent positives and negatives.  Physical Examination  Filed Vitals:   01/24/16 0903  BP: 122/80  Pulse: 66  Temp: 97.6 F (36.4 C)  TempSrc: Oral  Resp: 14  Height: '5\' 9"'$  (1.753 m)  Weight: 182 lb (82.555  kg)  SpO2: 99%   Body mass index is 26.86 kg/(m^2).  General: WDWN in NAD Gait: Normal HENT: WNL Eyes: Pupils equal Pulmonary: normal non-labored breathing, CTAB Cardiac: RRR, no murmur detected  Abdomen: soft, NT, no masses palpated Skin: no rashes, no ulcers, no cellulitis.   VASCULAR EXAM  Carotid Bruits Right Left   Negative Positive   Radial pulses are 2+ palpable and =  Abdominal aorta is slightly palpable   VASCULAR EXAM: Extremities without ischemic changes  without Gangrene; without open wounds.     LE Pulses Right Left   FEMORAL  2+palpable  2+palpable    POPLITEAL not palpable  not palpable   POSTERIOR TIBIAL 1+ palpable  2+ palpable    DORSALIS PEDIS  ANTERIOR TIBIAL 2+ palpable  2+ palpable     Musculoskeletal: no muscle wasting or atrophy; no peripheral edema Neurologic: A&O X 3; Appropriate Affect;  SENSATION: normal; MOTOR FUNCTION: 5/5 Symmetric, CN 2-12 intact Speech is fluent/normal               Non-Invasive Vascular Imaging  AAA Duplex (01/24/2016) ILIAC ARTERY STENT EVALUATION    INDICATION: Follow up stent    PREVIOUS INTERVENTION(S): Right EIA stent placed 02/22/2014    DUPLEX EXAM:     RIGHT  LEFT   Peak Systolic Velocity (cm/s) Ratio (if abnormal) Waveform  Peak Systolic Velocity (cm/s) Ratio (if abnormal) Waveform  114  B Aorta - Distal     197  B Artery - Proximal to Stent     142  B Stent - Proximal     188  B Stent - Mid     123  B Stent - Distal     127  B Artery - Distal to Stent     - Today's ABI / TBI -  - Previous ABI / TBI (  ) -    Waveform:    M - Monophasic       B - Biphasic       T - Triphasic  If Ankle Brachial Index (ABI) or Toe Brachial Index (TBI) performed, please see complete report     ADDITIONAL FINDINGS:     IMPRESSION: 1. 4.0 x 4.3 infrarenal aortic aneurysm with intramural thrombus noted, non -flow reducing. 2. Patent right external iliac artery stent with increased velocity in the proximal common iliac artery    Compared to the previous exam:  Unable to obtain increased velocity in the right common iliac artery in comparison to prior exam     Medical Decision Making  The patient is a 65 y.o. male who presents with asymptomatic AAA with stable size compared to September 2016; largest diameter measured today is 4.3 cm.  Left carotid bruit: Carotid duplex September 2016 indicates <40% bilateral ICA stenosis. He has no history of stroke or TIA.  PAOD: patent right external iliac artery stent with  increased velocity in the proximal common iliac artery. His pedal pulses are 2+ and 1+ palpable bilaterally. He has a tired feeling in both buttocks after walking about 1/4 mile, relieved by rest, no claudication in thighs or calves. There are no signs of ischemia in his feet/legs.  His atherosclerotic risk factors include active smoking for 43 years and DM.  His AAA risk factor for enlargement includes active smoking x 43 years.   Based on this patient's exam and diagnostic studies, the patient will follow up in 1 year  with the following studies: right iliac stent duplex, AAA duplex,  ABI's, and carotid duplex.  Consideration for repair of AAA would be made when the size is 5.5 cm, growth > 1 cm/yr, and symptomatic status.  I emphasized the importance of maximal medical management including strict control of blood pressure, blood glucose, and lipid levels, antiplatelet agents, obtaining regular exercise, and cessation of smoking.   The patient was given information about AAA including signs, symptoms, treatment, and how to minimize the risk of enlargement and rupture of aneurysms.    The patient was advised to call [phone] should the patient experience sudden onset abdominal or back pain.   Thank you for allowing [place] to participate in this patient's care.  Clemon Chambers, RN, MSN, FNP-C [facility] [place] Office: [phone]  Clinic Physician: Scot Dock  01/24/2016, 9:15 AM

## 2016-01-24 NOTE — Patient Instructions (Signed)
Abdominal Aortic Aneurysm An aneurysm is a weakened or damaged part of an artery wall that bulges from the normal force of blood pumping through the body. An abdominal aortic aneurysm is an aneurysm that occurs in the lower part of the aorta, the main artery of the body.  The major concern with an abdominal aortic aneurysm is that it can enlarge and burst (rupture) or blood can flow between the layers of the wall of the aorta through a tear (aorticdissection). Both of these conditions can cause bleeding inside the body and can be life threatening unless diagnosed and treated promptly. CAUSES  The exact cause of an abdominal aortic aneurysm is unknown. Some contributing factors are:   A hardening of the arteries caused by the buildup of fat and other substances in the lining of a blood vessel (arteriosclerosis).  Inflammation of the walls of an artery (arteritis).   Connective tissue diseases, such as Marfan syndrome.   Abdominal trauma.   An infection, such as syphilis or staphylococcus, in the wall of the aorta (infectious aortitis) caused by bacteria. RISK FACTORS  Risk factors that contribute to an abdominal aortic aneurysm may include:  Age older than 60 years.   High blood pressure (hypertension).  Male gender.  Ethnicity (white race).  Obesity.  Family history of aneurysm (first degree relatives only).  Tobacco use. PREVENTION  The following healthy lifestyle habits may help decrease your risk of abdominal aortic aneurysm:  Quitting smoking. Smoking can raise your blood pressure and cause arteriosclerosis.  Limiting or avoiding alcohol.  Keeping your blood pressure, blood sugar level, and cholesterol levels within normal limits.  Decreasing your salt intake. In somepeople, too much salt can raise blood pressure and increase your risk of abdominal aortic aneurysm.  Eating a diet low in saturated fats and cholesterol.  Increasing your fiber intake by including  whole grains, vegetables, and fruits in your diet. Eating these foods may help lower blood pressure.  Maintaining a healthy weight.  Staying physically active and exercising regularly. SYMPTOMS  The symptoms of abdominal aortic aneurysm may vary depending on the size and rate of growth of the aneurysm.Most grow slowly and do not have any symptoms. When symptoms do occur, they may include:  Pain (abdomen, side, lower back, or groin). The pain may vary in intensity. A sudden onset of severe pain may indicate that the aneurysm has ruptured.  Feeling full after eating only small amounts of food.  Nausea or vomiting or both.  Feeling a pulsating lump in the abdomen.  Feeling faint or passing out. DIAGNOSIS  Since most unruptured abdominal aortic aneurysms have no symptoms, they are often discovered during diagnostic exams for other conditions. An aneurysm may be found during the following procedures:  Ultrasonography (A one-time screening for abdominal aortic aneurysm by ultrasonography is also recommended for all men aged 65-75 years who have ever smoked).  X-ray exams.  A computed tomography (CT).  Magnetic resonance imaging (MRI).  Angiography or arteriography. TREATMENT  Treatment of an abdominal aortic aneurysm depends on the size of your aneurysm, your age, and risk factors for rupture. Medication to control blood pressure and pain may be used to manage aneurysms smaller than 6 cm. Regular monitoring for enlargement may be recommended by your caregiver if:  The aneurysm is 3-4 cm in size (an annual ultrasonography may be recommended).  The aneurysm is 4-4.5 cm in size (an ultrasonography every 6 months may be recommended).  The aneurysm is larger than 4.5 cm in   size (your caregiver may ask that you be examined by a vascular surgeon). If your aneurysm is larger than 6 cm, surgical repair may be recommended. There are two main methods for repair of an aneurysm:   Endovascular  repair (a minimally invasive surgery). This is done most often.  Open repair. This method is used if an endovascular repair is not possible.   This information is not intended to replace advice given to you by your health care provider. Make sure you discuss any questions you have with your health care provider.   Document Released: 04/03/2005 Document Revised: 10/19/2012 Document Reviewed: 07/24/2012 Elsevier Interactive Patient Education 2016 Elsevier Inc.    Peripheral Vascular Disease Peripheral vascular disease (PVD) is a disease of the blood vessels that are not part of your heart and brain. A simple term for PVD is poor circulation. In most cases, PVD narrows the blood vessels that carry blood from your heart to the rest of your body. This can result in a decreased supply of blood to your arms, legs, and internal organs, like your stomach or kidneys. However, it most often affects a person's lower legs and feet. There are two types of PVD.  Organic PVD. This is the more common type. It is caused by damage to the structure of blood vessels.  Functional PVD. This is caused by conditions that make blood vessels contract and tighten (spasm). Without treatment, PVD tends to get worse over time. PVD can also lead to acute ischemic limb. This is when an arm or limb suddenly has trouble getting enough blood. This is a medical emergency. CAUSES Each type of PVD has many different causes. The most common cause of PVD is buildup of a fatty material (plaque) inside of your arteries (atherosclerosis). Small amounts of plaque can break off from the walls of the blood vessels and become lodged in a smaller artery. This blocks blood flow and can cause acute ischemic limb. Other common causes of PVD include:  Blood clots that form inside of blood vessels.  Injuries to blood vessels.  Diseases that cause inflammation of blood vessels or cause blood vessel spasms.  Health behaviors and health  history that increase your risk of developing PVD. RISK FACTORS  You may have a greater risk of PVD if you:  Have a family history of PVD.  Have certain medical conditions, including:  High cholesterol.  Diabetes.  High blood pressure (hypertension).  Coronary heart disease.  Past problems with blood clots.  Past injury, such as burns or a broken bone. These may have damaged blood vessels in your limbs.  Buerger disease. This is caused by inflamed blood vessels in your hands and feet.  Some forms of arthritis.  Rare birth defects that affect the arteries in your legs.  Use tobacco.  Do not get enough exercise.  Are obese.  Are age 28 or older. SIGNS AND SYMPTOMS  PVD may cause many different symptoms. Your symptoms depend on what part of your body is not getting enough blood. Some common signs and symptoms include:  Cramps in your lower legs. This may be a symptom of poor leg circulation (claudication).  Pain and weakness in your legs while you are physically active that goes away when you rest (intermittent claudication).  Leg pain when at rest.  Leg numbness, tingling, or weakness.  Coldness in a leg or foot, especially when compared with the other leg.  Skin or hair changes. These can include:  Hair loss.  Shiny  skin.  Pale or bluish skin.  Thick toenails.  Inability to get or maintain an erection (erectile dysfunction). People with PVD are more prone to developing ulcers and sores on their toes, feet, or legs. These may take longer than normal to heal. DIAGNOSIS Your health care provider may diagnose PVD from your signs and symptoms. The health care provider will also do a physical exam. You may have tests to find out what is causing your PVD and determine its severity. Tests may include:  Blood pressure recordings from your arms and legs and measurements of the strength of your pulses (pulse volume recordings).  Imaging studies using sound waves to  take pictures of the blood flow through your blood vessels (Doppler ultrasound).  Injecting a dye into your blood vessels before having imaging studies using:  X-rays (angiogram or arteriogram).  Computer-generated X-rays (CT angiogram).  A powerful electromagnetic field and a computer (magnetic resonance angiogram or MRA). TREATMENT Treatment for PVD depends on the cause of your condition and the severity of your symptoms. It also depends on your age. Underlying causes need to be treated and controlled. These include long-lasting (chronic) conditions, such as diabetes, high cholesterol, and high blood pressure. You may need to first try making lifestyle changes and taking medicines. Surgery may be needed if these do not work. Lifestyle changes may include:  Quitting smoking.  Exercising regularly.  Following a low-fat, low-cholesterol diet. Medicines may include:  Blood thinners to prevent blood clots.  Medicines to improve blood flow.  Medicines to improve your blood cholesterol levels. Surgical procedures may include:  A procedure that uses an inflated balloon to open a blocked artery and improve blood flow (angioplasty).  A procedure to put in a tube (stent) to keep a blocked artery open (stent implant).  Surgery to reroute blood flow around a blocked artery (peripheral bypass surgery).  Surgery to remove dead tissue from an infected wound on the affected limb.  Amputation. This is surgical removal of the affected limb. This may be necessary in cases of acute ischemic limb that are not improved through medical or surgical treatments. HOME CARE INSTRUCTIONS  Take medicines only as directed by your health care provider.  Do not use any tobacco products, including cigarettes, chewing tobacco, or electronic cigarettes. If you need help quitting, ask your health care provider.  Lose weight if you are overweight, and maintain a healthy weight as directed by your health care  provider.  Eat a diet that is low in fat and cholesterol. If you need help, ask your health care provider.  Exercise regularly. Ask your health care provider to suggest some good activities for you.  Use compression stockings or other mechanical devices as directed by your health care provider.  Take good care of your feet.  Wear comfortable shoes that fit well.  Check your feet often for any cuts or sores. SEEK MEDICAL CARE IF:  You have cramps in your legs while walking.  You have leg pain when you are at rest.  You have coldness in a leg or foot.  Your skin changes.  You have erectile dysfunction.  You have cuts or sores on your feet that are not healing. SEEK IMMEDIATE MEDICAL CARE IF:  Your arm or leg turns cold and blue.  Your arms or legs become red, warm, swollen, painful, or numb.  You have chest pain or trouble breathing.  You suddenly have weakness in your face, arm, or leg.  You become very confused or  lose the ability to speak.  You suddenly have a very bad headache or lose your vision.   This information is not intended to replace advice given to you by your health care provider. Make sure you discuss any questions you have with your health care provider.   Document Released: 08/01/2004 Document Revised: 07/15/2014 Document Reviewed: 12/02/2013 Elsevier Interactive Patient Education 2016 [employer].    Steps to Quit Smoking  Smoking tobacco can be harmful to your health and can affect almost every organ in your body. Smoking puts you, and those around you, at risk for developing many serious chronic diseases. Quitting smoking is difficult, but it is one of the best things that you can do for your health. It is never too late to quit. WHAT ARE THE BENEFITS OF QUITTING SMOKING? When you quit smoking, you lower your risk of developing serious diseases and conditions, such as:  Lung cancer or lung disease, such as COPD.  Heart  disease.  Stroke.  Heart attack.  Infertility.  Osteoporosis and bone fractures. Additionally, symptoms such as coughing, wheezing, and shortness of breath may get better when you quit. You may also find that you get sick less often because your body is stronger at fighting off colds and infections. If you are pregnant, quitting smoking can help to reduce your chances of having a baby of low birth weight. HOW DO I GET READY TO QUIT? When you decide to quit smoking, create a plan to make sure that you are successful. Before you quit:  Pick a date to quit. Set a date within the next two weeks to give you time to prepare.  Write down the reasons why you are quitting. Keep this list in places where you will see it often, such as on your bathroom mirror or in your car or wallet.  Identify the people, places, things, and activities that make you want to smoke (triggers) and avoid them. Make sure to take these actions:  Throw away all cigarettes at home, at work, and in your car.  Throw away smoking accessories, such as Scientist, research (medical).  Clean your car and make sure to empty the ashtray.  Clean your home, including curtains and carpets.  Tell your family, friends, and coworkers that you are quitting. Support from your loved ones can make quitting easier.  Talk with your health care provider about your options for quitting smoking.  Find out what treatment options are covered by your health insurance. WHAT STRATEGIES CAN I USE TO QUIT SMOKING?  Talk with your healthcare provider about different strategies to quit smoking. Some strategies include:  Quitting smoking altogether instead of gradually lessening how much you smoke over a period of time. Research shows that quitting "cold [place]" is more successful than gradually quitting.  Attending in-person counseling to help you build problem-solving skills. You are more likely to have success in quitting if you attend several  counseling sessions. Even short sessions of 10 minutes can be effective.  Finding resources and support systems that can help you to quit smoking and remain smoke-free after you quit. These resources are most helpful when you use them often. They can include:  Online chats with a Social worker.  Telephone quitlines.  Printed Furniture conservator/restorer.  Support groups or group counseling.  Text messaging programs.  Mobile phone applications.  Taking medicines to help you quit smoking. (If you are pregnant or breastfeeding, talk with your health care provider first.) Some medicines contain nicotine and some do  not. Both types of medicines help with cravings, but the medicines that include nicotine help to relieve withdrawal symptoms. Your health care provider may recommend:  Nicotine patches, gum, or lozenges.  Nicotine inhalers or sprays.  Non-nicotine medicine that is taken by mouth. Talk with your health care provider about combining strategies, such as taking medicines while you are also receiving in-person counseling. Using these two strategies together makes you more likely to succeed in quitting than if you used either strategy on its own. If you are pregnant or breastfeeding, talk with your health care provider about finding counseling or other support strategies to quit smoking. Do not take medicine to help you quit smoking unless told to do so by your health care provider. WHAT THINGS CAN I DO TO MAKE IT EASIER TO QUIT? Quitting smoking might feel overwhelming at first, but there is a lot that you can do to make it easier. Take these important actions:  Reach out to your family and friends and ask that they support and encourage you during this time. Call telephone quitlines, reach out to support groups, or work with a counselor for support.  Ask people who smoke to avoid smoking around you.  Avoid places that trigger you to smoke, such as bars, parties, or smoke-break areas at  work.  Spend time around people who do not smoke.  Lessen stress in your life, because stress can be a smoking trigger for some people. To lessen stress, try:  Exercising regularly.  Deep-breathing exercises.  Yoga.  Meditating.  Performing a body scan. This involves closing your eyes, scanning your body from head to toe, and noticing which parts of your body are particularly tense. Purposefully relax the muscles in those areas.  Download or purchase mobile phone or tablet apps (applications) that can help you stick to your quit plan by providing reminders, tips, and encouragement. There are many free apps, such as QuitGuide from the [employer] Office manager for Disease Control and Prevention). You can find other support for quitting smoking (smoking cessation) through smokefree.gov and other websites. HOW WILL I FEEL WHEN I QUIT SMOKING? Within the first 24 hours of quitting smoking, you may start to feel some withdrawal symptoms. These symptoms are usually most noticeable 2-3 days after quitting, but they usually do not last beyond 2-3 weeks. Changes or symptoms that you might experience include:  Mood swings.  Restlessness, anxiety, or irritation.  Difficulty concentrating.  Dizziness.  Strong cravings for sugary foods in addition to nicotine.  Mild weight gain.  Constipation.  Nausea.  Coughing or a sore throat.  Changes in how your medicines work in your body.  A depressed mood.  Difficulty sleeping (insomnia). After the first 2-3 weeks of quitting, you may start to notice more positive results, such as:  Improved sense of smell and taste.  Decreased coughing and sore throat.  Slower heart rate.  Lower blood pressure.  Clearer skin.  The ability to breathe more easily.  Fewer sick days. Quitting smoking is very challenging for most people. Do not get discouraged if you are not successful the first time. Some people need to make many attempts to quit before they  achieve long-term success. Do your best to stick to your quit plan, and talk with your health care provider if you have any questions or concerns.   This information is not intended to replace advice given to you by your health care provider. Make sure you discuss any questions you have with your health care  provider.   Document Released: 06/18/2001 Document Revised: 11/08/2014 Document Reviewed: 11/08/2014 Elsevier Interactive Patient Education [employer].

## 2016-04-12 ENCOUNTER — Encounter: Payer: Self-pay | Admitting: Family

## 2016-04-16 ENCOUNTER — Ambulatory Visit: Payer: Self-pay | Admitting: Family

## 2016-04-16 ENCOUNTER — Encounter (HOSPITAL_COMMUNITY): Payer: BLUE CROSS/BLUE SHIELD

## 2016-06-05 ENCOUNTER — Encounter: Payer: Self-pay | Admitting: Family

## 2016-06-10 ENCOUNTER — Ambulatory Visit (HOSPITAL_COMMUNITY): Payer: BLUE CROSS/BLUE SHIELD

## 2016-06-10 ENCOUNTER — Ambulatory Visit: Payer: Self-pay | Admitting: Family

## 2016-06-24 ENCOUNTER — Telehealth (HOSPITAL_COMMUNITY): Payer: Self-pay | Admitting: *Deleted

## 2016-06-24 NOTE — Telephone Encounter (Signed)
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  Explained his carotid duplex appointment for January and provider appointment can be rescheduled for 4 years out.    Stressed to patient this does not cancel or in anyway change his AAA surveillance appointments and if he experiences any TIA/stroke symptoms to call our office/or 911.

## 2016-07-18 ENCOUNTER — Ambulatory Visit: Payer: Self-pay | Admitting: Family

## 2016-07-18 ENCOUNTER — Encounter (HOSPITAL_COMMUNITY): Payer: BLUE CROSS/BLUE SHIELD

## 2017-04-10 DIAGNOSIS — I10 Essential (primary) hypertension: Secondary | ICD-10-CM | POA: Diagnosis not present

## 2017-04-10 DIAGNOSIS — N183 Chronic kidney disease, stage 3 (moderate): Secondary | ICD-10-CM | POA: Diagnosis not present

## 2017-04-10 DIAGNOSIS — D509 Iron deficiency anemia, unspecified: Secondary | ICD-10-CM | POA: Diagnosis not present

## 2017-04-10 DIAGNOSIS — E559 Vitamin D deficiency, unspecified: Secondary | ICD-10-CM | POA: Diagnosis not present

## 2017-04-15 DIAGNOSIS — I10 Essential (primary) hypertension: Secondary | ICD-10-CM | POA: Diagnosis not present

## 2017-04-15 DIAGNOSIS — E1129 Type 2 diabetes mellitus with other diabetic kidney complication: Secondary | ICD-10-CM | POA: Diagnosis not present

## 2017-04-15 DIAGNOSIS — N184 Chronic kidney disease, stage 4 (severe): Secondary | ICD-10-CM | POA: Diagnosis not present

## 2017-04-15 DIAGNOSIS — D649 Anemia, unspecified: Secondary | ICD-10-CM | POA: Diagnosis not present

## 2017-04-15 DIAGNOSIS — R809 Proteinuria, unspecified: Secondary | ICD-10-CM | POA: Diagnosis not present

## 2017-04-22 DIAGNOSIS — Z1389 Encounter for screening for other disorder: Secondary | ICD-10-CM | POA: Diagnosis not present

## 2017-04-22 DIAGNOSIS — E663 Overweight: Secondary | ICD-10-CM | POA: Diagnosis not present

## 2017-04-22 DIAGNOSIS — N184 Chronic kidney disease, stage 4 (severe): Secondary | ICD-10-CM | POA: Diagnosis not present

## 2017-04-22 DIAGNOSIS — E782 Mixed hyperlipidemia: Secondary | ICD-10-CM | POA: Diagnosis not present

## 2017-04-22 DIAGNOSIS — E1129 Type 2 diabetes mellitus with other diabetic kidney complication: Secondary | ICD-10-CM | POA: Diagnosis not present

## 2017-04-22 DIAGNOSIS — Z6826 Body mass index (BMI) 26.0-26.9, adult: Secondary | ICD-10-CM | POA: Diagnosis not present

## 2017-04-28 ENCOUNTER — Other Ambulatory Visit: Payer: Self-pay | Admitting: *Deleted

## 2017-04-28 NOTE — Patient Outreach (Signed)
[facility]) Care Management  04/28/2017  Juan Bass 1950-11-18 [id number]   Health Risk Assessment  Unsuccessful telephone outreach, able to leave a HIPAA complaint message requesting a return call.  Plan Will attempt return screening outreach call within the week.   Joylene Draft, RN, [place] Management Coordinator  [phone]- Mobile [phone]- Toll Free Main Office

## 2017-05-06 ENCOUNTER — Other Ambulatory Visit: Payer: Self-pay | Admitting: *Deleted

## 2017-05-06 NOTE — Patient Outreach (Signed)
[facility]) Care Management  05/06/2017  Juan Bass 27-Sep-1950 [id number]   Health Risk Assessment  Telephone outreach to patient unsuccessful, able to leave a HIPAA compliant message requesting a return call   Plan Will plan return call within the week.    Joylene Draft, RN, [place] Management Coordinator  [phone]- Mobile [phone]- Toll Free Main Office

## 2017-05-09 ENCOUNTER — Other Ambulatory Visit: Payer: Self-pay | Admitting: *Deleted

## 2017-05-09 NOTE — Patient Outreach (Signed)
[facility]) Care Management  05/09/2017  Juan Bass December 28, 1950 [id number]   Health risk Assessment call, 3rd Attempt.   Placed call to patient , no answer able to leave a HIPAA compliant message .  Plan Will await return call if no response will send unsuccessful outreach  letter.  Joylene Draft, RN, [place] Management Coordinator  [phone]- Mobile [phone]- Toll Free Main Office

## 2017-05-12 ENCOUNTER — Encounter: Payer: Self-pay | Admitting: *Deleted

## 2017-06-04 DIAGNOSIS — E1129 Type 2 diabetes mellitus with other diabetic kidney complication: Secondary | ICD-10-CM | POA: Diagnosis not present

## 2017-06-04 DIAGNOSIS — Z6826 Body mass index (BMI) 26.0-26.9, adult: Secondary | ICD-10-CM | POA: Diagnosis not present

## 2017-06-04 DIAGNOSIS — E663 Overweight: Secondary | ICD-10-CM | POA: Diagnosis not present

## 2017-06-04 DIAGNOSIS — Z0001 Encounter for general adult medical examination with abnormal findings: Secondary | ICD-10-CM | POA: Diagnosis not present

## 2017-07-10 DIAGNOSIS — E559 Vitamin D deficiency, unspecified: Secondary | ICD-10-CM | POA: Diagnosis not present

## 2017-07-10 DIAGNOSIS — I10 Essential (primary) hypertension: Secondary | ICD-10-CM | POA: Diagnosis not present

## 2017-07-10 DIAGNOSIS — D509 Iron deficiency anemia, unspecified: Secondary | ICD-10-CM | POA: Diagnosis not present

## 2017-07-10 DIAGNOSIS — N183 Chronic kidney disease, stage 3 (moderate): Secondary | ICD-10-CM | POA: Diagnosis not present

## 2017-07-15 DIAGNOSIS — R809 Proteinuria, unspecified: Secondary | ICD-10-CM | POA: Diagnosis not present

## 2017-07-15 DIAGNOSIS — I10 Essential (primary) hypertension: Secondary | ICD-10-CM | POA: Diagnosis not present

## 2017-07-15 DIAGNOSIS — E1129 Type 2 diabetes mellitus with other diabetic kidney complication: Secondary | ICD-10-CM | POA: Diagnosis not present

## 2017-07-15 DIAGNOSIS — N184 Chronic kidney disease, stage 4 (severe): Secondary | ICD-10-CM | POA: Diagnosis not present

## 2017-07-15 DIAGNOSIS — N250 Renal osteodystrophy: Secondary | ICD-10-CM | POA: Diagnosis not present

## 2017-07-15 DIAGNOSIS — D649 Anemia, unspecified: Secondary | ICD-10-CM | POA: Diagnosis not present

## 2017-08-13 DIAGNOSIS — E559 Vitamin D deficiency, unspecified: Secondary | ICD-10-CM | POA: Diagnosis not present

## 2017-08-13 DIAGNOSIS — N183 Chronic kidney disease, stage 3 (moderate): Secondary | ICD-10-CM | POA: Diagnosis not present

## 2017-08-13 DIAGNOSIS — I10 Essential (primary) hypertension: Secondary | ICD-10-CM | POA: Diagnosis not present

## 2017-08-13 DIAGNOSIS — D509 Iron deficiency anemia, unspecified: Secondary | ICD-10-CM | POA: Diagnosis not present

## 2017-08-19 DIAGNOSIS — D631 Anemia in chronic kidney disease: Secondary | ICD-10-CM | POA: Diagnosis not present

## 2017-08-19 DIAGNOSIS — R801 Persistent proteinuria, unspecified: Secondary | ICD-10-CM | POA: Diagnosis not present

## 2017-08-19 DIAGNOSIS — E889 Metabolic disorder, unspecified: Secondary | ICD-10-CM | POA: Diagnosis not present

## 2017-08-19 DIAGNOSIS — I10 Essential (primary) hypertension: Secondary | ICD-10-CM | POA: Diagnosis not present

## 2017-08-19 DIAGNOSIS — M9080 Osteopathy in diseases classified elsewhere, unspecified site: Secondary | ICD-10-CM | POA: Diagnosis not present

## 2017-08-19 DIAGNOSIS — N184 Chronic kidney disease, stage 4 (severe): Secondary | ICD-10-CM | POA: Diagnosis not present

## 2017-11-11 DIAGNOSIS — E559 Vitamin D deficiency, unspecified: Secondary | ICD-10-CM | POA: Diagnosis not present

## 2017-11-11 DIAGNOSIS — I10 Essential (primary) hypertension: Secondary | ICD-10-CM | POA: Diagnosis not present

## 2017-11-11 DIAGNOSIS — N183 Chronic kidney disease, stage 3 (moderate): Secondary | ICD-10-CM | POA: Diagnosis not present

## 2017-11-11 DIAGNOSIS — D509 Iron deficiency anemia, unspecified: Secondary | ICD-10-CM | POA: Diagnosis not present

## 2017-11-18 DIAGNOSIS — E1129 Type 2 diabetes mellitus with other diabetic kidney complication: Secondary | ICD-10-CM | POA: Diagnosis not present

## 2017-11-18 DIAGNOSIS — I10 Essential (primary) hypertension: Secondary | ICD-10-CM | POA: Diagnosis not present

## 2017-11-18 DIAGNOSIS — D649 Anemia, unspecified: Secondary | ICD-10-CM | POA: Diagnosis not present

## 2017-11-18 DIAGNOSIS — N184 Chronic kidney disease, stage 4 (severe): Secondary | ICD-10-CM | POA: Diagnosis not present

## 2017-11-18 DIAGNOSIS — E559 Vitamin D deficiency, unspecified: Secondary | ICD-10-CM | POA: Diagnosis not present

## 2017-11-18 DIAGNOSIS — R809 Proteinuria, unspecified: Secondary | ICD-10-CM | POA: Diagnosis not present

## 2017-11-25 DIAGNOSIS — Z1389 Encounter for screening for other disorder: Secondary | ICD-10-CM | POA: Diagnosis not present

## 2017-11-25 DIAGNOSIS — Z6826 Body mass index (BMI) 26.0-26.9, adult: Secondary | ICD-10-CM | POA: Diagnosis not present

## 2017-11-25 DIAGNOSIS — E782 Mixed hyperlipidemia: Secondary | ICD-10-CM | POA: Diagnosis not present

## 2017-11-25 DIAGNOSIS — D649 Anemia, unspecified: Secondary | ICD-10-CM | POA: Diagnosis not present

## 2017-11-25 DIAGNOSIS — E663 Overweight: Secondary | ICD-10-CM | POA: Diagnosis not present

## 2017-11-25 DIAGNOSIS — I10 Essential (primary) hypertension: Secondary | ICD-10-CM | POA: Diagnosis not present

## 2017-11-25 DIAGNOSIS — E1129 Type 2 diabetes mellitus with other diabetic kidney complication: Secondary | ICD-10-CM | POA: Diagnosis not present

## 2017-11-25 DIAGNOSIS — R2689 Other abnormalities of gait and mobility: Secondary | ICD-10-CM | POA: Diagnosis not present

## 2017-11-25 DIAGNOSIS — Z719 Counseling, unspecified: Secondary | ICD-10-CM | POA: Diagnosis not present

## 2017-11-25 DIAGNOSIS — E1165 Type 2 diabetes mellitus with hyperglycemia: Secondary | ICD-10-CM | POA: Diagnosis not present

## 2017-12-09 DIAGNOSIS — Z1211 Encounter for screening for malignant neoplasm of colon: Secondary | ICD-10-CM | POA: Diagnosis not present

## 2018-01-06 DIAGNOSIS — D509 Iron deficiency anemia, unspecified: Secondary | ICD-10-CM | POA: Diagnosis not present

## 2018-01-06 DIAGNOSIS — N183 Chronic kidney disease, stage 3 (moderate): Secondary | ICD-10-CM | POA: Diagnosis not present

## 2018-01-06 DIAGNOSIS — E559 Vitamin D deficiency, unspecified: Secondary | ICD-10-CM | POA: Diagnosis not present

## 2018-01-06 DIAGNOSIS — I10 Essential (primary) hypertension: Secondary | ICD-10-CM | POA: Diagnosis not present

## 2018-01-13 DIAGNOSIS — E889 Metabolic disorder, unspecified: Secondary | ICD-10-CM | POA: Diagnosis not present

## 2018-01-13 DIAGNOSIS — I10 Essential (primary) hypertension: Secondary | ICD-10-CM | POA: Diagnosis not present

## 2018-01-13 DIAGNOSIS — D649 Anemia, unspecified: Secondary | ICD-10-CM | POA: Diagnosis not present

## 2018-01-13 DIAGNOSIS — E1129 Type 2 diabetes mellitus with other diabetic kidney complication: Secondary | ICD-10-CM | POA: Diagnosis not present

## 2018-01-13 DIAGNOSIS — R809 Proteinuria, unspecified: Secondary | ICD-10-CM | POA: Diagnosis not present

## 2018-01-13 DIAGNOSIS — M9080 Osteopathy in diseases classified elsewhere, unspecified site: Secondary | ICD-10-CM | POA: Diagnosis not present

## 2018-01-13 DIAGNOSIS — N184 Chronic kidney disease, stage 4 (severe): Secondary | ICD-10-CM | POA: Diagnosis not present

## 2018-03-11 DIAGNOSIS — N183 Chronic kidney disease, stage 3 (moderate): Secondary | ICD-10-CM | POA: Diagnosis not present

## 2018-03-11 DIAGNOSIS — D509 Iron deficiency anemia, unspecified: Secondary | ICD-10-CM | POA: Diagnosis not present

## 2018-03-11 DIAGNOSIS — E559 Vitamin D deficiency, unspecified: Secondary | ICD-10-CM | POA: Diagnosis not present

## 2018-03-11 DIAGNOSIS — I10 Essential (primary) hypertension: Secondary | ICD-10-CM | POA: Diagnosis not present

## 2018-03-17 DIAGNOSIS — N184 Chronic kidney disease, stage 4 (severe): Secondary | ICD-10-CM | POA: Diagnosis not present

## 2018-03-17 DIAGNOSIS — D631 Anemia in chronic kidney disease: Secondary | ICD-10-CM | POA: Diagnosis not present

## 2018-03-17 DIAGNOSIS — E1129 Type 2 diabetes mellitus with other diabetic kidney complication: Secondary | ICD-10-CM | POA: Diagnosis not present

## 2018-03-17 DIAGNOSIS — R809 Proteinuria, unspecified: Secondary | ICD-10-CM | POA: Diagnosis not present

## 2018-03-17 DIAGNOSIS — I10 Essential (primary) hypertension: Secondary | ICD-10-CM | POA: Diagnosis not present

## 2018-03-20 DIAGNOSIS — E1129 Type 2 diabetes mellitus with other diabetic kidney complication: Secondary | ICD-10-CM | POA: Diagnosis not present

## 2018-04-01 DIAGNOSIS — E1165 Type 2 diabetes mellitus with hyperglycemia: Secondary | ICD-10-CM | POA: Diagnosis not present

## 2018-04-01 DIAGNOSIS — Z23 Encounter for immunization: Secondary | ICD-10-CM | POA: Diagnosis not present

## 2018-04-01 DIAGNOSIS — Z1389 Encounter for screening for other disorder: Secondary | ICD-10-CM | POA: Diagnosis not present

## 2018-04-01 DIAGNOSIS — Z6826 Body mass index (BMI) 26.0-26.9, adult: Secondary | ICD-10-CM | POA: Diagnosis not present

## 2018-04-01 DIAGNOSIS — Z0001 Encounter for general adult medical examination with abnormal findings: Secondary | ICD-10-CM | POA: Diagnosis not present

## 2018-04-01 DIAGNOSIS — E663 Overweight: Secondary | ICD-10-CM | POA: Diagnosis not present

## 2018-04-01 DIAGNOSIS — N184 Chronic kidney disease, stage 4 (severe): Secondary | ICD-10-CM | POA: Diagnosis not present

## 2018-04-01 DIAGNOSIS — E11311 Type 2 diabetes mellitus with unspecified diabetic retinopathy with macular edema: Secondary | ICD-10-CM | POA: Diagnosis not present

## 2018-05-12 DIAGNOSIS — I10 Essential (primary) hypertension: Secondary | ICD-10-CM | POA: Diagnosis not present

## 2018-05-12 DIAGNOSIS — D509 Iron deficiency anemia, unspecified: Secondary | ICD-10-CM | POA: Diagnosis not present

## 2018-05-12 DIAGNOSIS — R809 Proteinuria, unspecified: Secondary | ICD-10-CM | POA: Diagnosis not present

## 2018-05-12 DIAGNOSIS — N183 Chronic kidney disease, stage 3 (moderate): Secondary | ICD-10-CM | POA: Diagnosis not present

## 2018-05-19 DIAGNOSIS — D631 Anemia in chronic kidney disease: Secondary | ICD-10-CM | POA: Diagnosis not present

## 2018-05-19 DIAGNOSIS — E889 Metabolic disorder, unspecified: Secondary | ICD-10-CM | POA: Diagnosis not present

## 2018-05-19 DIAGNOSIS — E1129 Type 2 diabetes mellitus with other diabetic kidney complication: Secondary | ICD-10-CM | POA: Diagnosis not present

## 2018-05-19 DIAGNOSIS — M9080 Osteopathy in diseases classified elsewhere, unspecified site: Secondary | ICD-10-CM | POA: Diagnosis not present

## 2018-05-19 DIAGNOSIS — N185 Chronic kidney disease, stage 5: Secondary | ICD-10-CM | POA: Diagnosis not present

## 2018-05-22 ENCOUNTER — Other Ambulatory Visit: Payer: Self-pay

## 2018-05-22 DIAGNOSIS — N185 Chronic kidney disease, stage 5: Secondary | ICD-10-CM

## 2018-05-22 DIAGNOSIS — I6529 Occlusion and stenosis of unspecified carotid artery: Secondary | ICD-10-CM

## 2018-06-17 DIAGNOSIS — Z01818 Encounter for other preprocedural examination: Secondary | ICD-10-CM | POA: Diagnosis not present

## 2018-06-22 DIAGNOSIS — N184 Chronic kidney disease, stage 4 (severe): Secondary | ICD-10-CM | POA: Diagnosis not present

## 2018-06-22 DIAGNOSIS — R809 Proteinuria, unspecified: Secondary | ICD-10-CM | POA: Diagnosis not present

## 2018-06-22 DIAGNOSIS — I10 Essential (primary) hypertension: Secondary | ICD-10-CM | POA: Diagnosis not present

## 2018-06-22 DIAGNOSIS — D509 Iron deficiency anemia, unspecified: Secondary | ICD-10-CM | POA: Diagnosis not present

## 2018-06-25 DIAGNOSIS — E1129 Type 2 diabetes mellitus with other diabetic kidney complication: Secondary | ICD-10-CM | POA: Diagnosis not present

## 2018-06-25 DIAGNOSIS — E889 Metabolic disorder, unspecified: Secondary | ICD-10-CM | POA: Diagnosis not present

## 2018-06-25 DIAGNOSIS — R809 Proteinuria, unspecified: Secondary | ICD-10-CM | POA: Diagnosis not present

## 2018-06-25 DIAGNOSIS — D649 Anemia, unspecified: Secondary | ICD-10-CM | POA: Diagnosis not present

## 2018-06-25 DIAGNOSIS — M9080 Osteopathy in diseases classified elsewhere, unspecified site: Secondary | ICD-10-CM | POA: Diagnosis not present

## 2018-06-25 DIAGNOSIS — I10 Essential (primary) hypertension: Secondary | ICD-10-CM | POA: Diagnosis not present

## 2018-06-25 DIAGNOSIS — N185 Chronic kidney disease, stage 5: Secondary | ICD-10-CM | POA: Diagnosis not present

## 2018-07-10 ENCOUNTER — Telehealth (HOSPITAL_COMMUNITY): Payer: Self-pay | Admitting: Surgery

## 2018-07-10 NOTE — Telephone Encounter (Signed)
Attempted to contact patient to confirm appointment for 07/13/2017 [facility]

## 2018-07-13 ENCOUNTER — Encounter: Payer: Self-pay | Admitting: Surgery

## 2018-07-13 ENCOUNTER — Other Ambulatory Visit: Payer: Self-pay

## 2018-07-13 ENCOUNTER — Ambulatory Visit (INDEPENDENT_AMBULATORY_CARE_PROVIDER_SITE_OTHER): Payer: PPO | Admitting: Surgery

## 2018-07-13 ENCOUNTER — Ambulatory Visit (INDEPENDENT_AMBULATORY_CARE_PROVIDER_SITE_OTHER)
Admission: RE | Admit: 2018-07-13 | Discharge: 2018-07-13 | Disposition: A | Discharge status: Home / Self Care | Payer: PPO | Source: Ambulatory Visit | Attending: Family Medicine | Admitting: Family Medicine

## 2018-07-13 ENCOUNTER — Ambulatory Visit (HOSPITAL_COMMUNITY)
Admission: RE | Admit: 2018-07-13 | Discharge: 2018-07-13 | Disposition: A | Discharge status: Home / Self Care | Payer: PPO | Source: Ambulatory Visit | Attending: Family Medicine | Admitting: Family Medicine

## 2018-07-13 VITALS — BP 127/78 | HR 73 | Temp 97.5°F | Resp 20 | Ht 69.0 in | Wt 182.0 lb

## 2018-07-13 DIAGNOSIS — N185 Chronic kidney disease, stage 5: Secondary | ICD-10-CM | POA: Diagnosis not present

## 2018-07-13 DIAGNOSIS — N184 Chronic kidney disease, stage 4 (severe): Secondary | ICD-10-CM | POA: Diagnosis not present

## 2018-07-13 NOTE — Progress Notes (Signed)
[facility] [place]  Patient name: Juan Bass MRN: [id number] DOB: January 24, 1951 Sex: male   REQUESTING PROVIDER:    Dr. Lowanda Foster   REASON FOR CONSULT:    Dialysis access  HISTORY OF PRESENT ILLNESS:   Juan Bass is a 68 y.o. male, who is referred today for evaluation of dialysis access.  Patient renal failure secondary to hypertension and diabetes.  His creatinine most recently was 4.81.  His blood pressure has been well controlled.  He does suffer from proteinuria due to his diabetes.  He also suffers from hyperparathyroidism and is taking Calcitrol.  He is anemic with the most recent hemoglobin of 10.  His diabetes has been moderately well controlled.  He takes a statin for hypercholesterolemia.  He is right-handed.  PAST MEDICAL HISTORY    Past Medical History:  Diagnosis Date  . Chronic kidney disease   . Diabetes mellitus without complication ([place])      FAMILY HISTORY   Family History  Problem Relation Age of Onset  . Hypertension Mother   . Deep vein thrombosis Father   . Hypertension Father   . Hypertension Sister   . Hypertension Brother     SOCIAL HISTORY:   Social History   Socioeconomic History  . Marital status: Married    Spouse name: Not on file  . Number of children: Not on file  . Years of education: Not on file  . Highest education level: Not on file  Occupational History  . Not on file  Social Needs  . Financial resource strain: Not on file  . Food insecurity:    Worry: Not on file    Inability: Not on file  . Transportation needs:    Medical: Not on file    Non-medical: Not on file  Tobacco Use  . Smoking status: Current Every Day Smoker    Packs/day: 0.75    Years: 40.00    Pack years: 30.00    Types: Cigarettes  . Smokeless tobacco: Never Used  Substance and Sexual Activity  . Alcohol use: Yes    Alcohol/week: 1.0 - 2.0 standard drinks    Types: 1 - 2 Cans  of beer per week  . Drug use: No  . Sexual activity: Not on file  Lifestyle  . Physical activity:    Days per week: Not on file    Minutes per session: Not on file  . Stress: Not on file  Relationships  . Social connections:    Talks on phone: Not on file    Gets together: Not on file    Attends religious service: Not on file    Active member of club or organization: Not on file    Attends meetings of clubs or organizations: Not on file    Relationship status: Not on file  . Intimate partner violence:    Fear of current or ex partner: Not on file    Emotionally abused: Not on file    Physically abused: Not on file    Forced sexual activity: Not on file  Other Topics Concern  . Not on file  Social History Narrative  . Not on file    ALLERGIES:    No Known Allergies  CURRENT MEDICATIONS:    Current Outpatient Medications  Medication Sig Dispense Refill  . amLODipine (NORVASC) 10 MG tablet Take 10 mg by mouth daily.    Marland Kitchen aspirin EC 81 MG tablet Take 81 mg by mouth daily.    Marland Kitchen  atorvastatin (LIPITOR) 20 MG tablet Take 20 mg by mouth at bedtime. Reported on 01/24/2016    . calcitRIOL (ROCALTROL) 0.25 MCG capsule TAKE ONE CAPSULE BY MOUTH EVERY DAY    . glipiZIDE (GLUCOTROL XL) 2.5 MG 24 hr tablet Take 2.5 mg by mouth daily with breakfast.    . labetalol (NORMODYNE) 100 MG tablet Take 200 mg by mouth 2 (two) times daily.     Marland Kitchen losartan (COZAAR) 100 MG tablet Take 100 mg by mouth daily.     No current facility-administered medications for this visit.     REVIEW OF SYSTEMS:   [X]  denotes positive finding, [ ]  denotes negative finding Cardiac  Comments:  Chest pain or chest pressure:    Shortness of breath upon exertion:    Short of breath when lying flat:    Irregular heart rhythm:        Vascular    Pain in calf, thigh, or hip brought on by ambulation:    Pain in feet at night that wakes you up from your sleep:     Blood clot in your veins:    Leg swelling:          Pulmonary    Oxygen at home:    Productive cough:     Wheezing:         Neurologic    Sudden weakness in arms or legs:     Sudden numbness in arms or legs:     Sudden onset of difficulty speaking or slurred speech:    Temporary loss of vision in one eye:     Problems with dizziness:         Gastrointestinal    Blood in stool:      Vomited blood:         Genitourinary    Burning when urinating:     Blood in urine:        Psychiatric    Major depression:         Hematologic    Bleeding problems:    Problems with blood clotting too easily:        Skin    Rashes or ulcers:        Constitutional    Fever or chills:     PHYSICAL EXAM:   Vitals:   07/13/18 0953  BP: 127/78  Pulse: 73  Resp: 20  Temp: (!) 97.5 F (36.4 C)  SpO2: 100%  Weight: 182 lb (82.6 kg)  Height: 5\' 9"  (1.753 m)    GENERAL: The patient is a well-nourished male, in no acute distress. The vital signs are documented above. CARDIAC: There is a regular rate and rhythm.  VASCULAR: Palpable left brachial and radial pulse PULMONARY: Nonlabored respirations MUSCULOSKELETAL: There are no major deformities or cyanosis. NEUROLOGIC: No focal weakness or paresthesias are detected. SKIN: There are no ulcers or rashes noted. PSYCHIATRIC: The patient has a normal affect.  STUDIES:   I have ordered and reviewed the following studies:  Upper arm Arterial duplex: Right side = triphasic, left side = biphasic  Vein mapping: +-----------------+-------------+----------+--------+ Right Cephalic   Diameter (cm)Depth (cm)Findings +-----------------+-------------+----------+--------+ Shoulder             0.42                        +-----------------+-------------+----------+--------+ Prox upper arm       0.34                        +-----------------+-------------+----------+--------+  Mid upper arm        0.32                         +-----------------+-------------+----------+--------+ Dist upper arm       0.36                        +-----------------+-------------+----------+--------+ Antecubital fossa    0.71                        +-----------------+-------------+----------+--------+ Prox forearm         0.19                        +-----------------+-------------+----------+--------+ Mid forearm          0.21                        +-----------------+-------------+----------+--------+ Dist forearm         0.20                        +-----------------+-------------+----------+--------+ Wrist                0.12                        +-----------------+-------------+----------+--------+  +-----------------+-------------+----------+--------+ Right Basilic    Diameter (cm)Depth (cm)Findings +-----------------+-------------+----------+--------+ Prox upper arm       0.26                        +-----------------+-------------+----------+--------+ Mid upper arm        0.32                        +-----------------+-------------+----------+--------+ Dist upper arm       0.30                        +-----------------+-------------+----------+--------+ Antecubital fossa    0.18                        +-----------------+-------------+----------+--------+  +-----------------+-------------+----------+--------+ Left Cephalic    Diameter (cm)Depth (cm)Findings +-----------------+-------------+----------+--------+ Shoulder             0.30                        +-----------------+-------------+----------+--------+ Prox upper arm       0.30                        +-----------------+-------------+----------+--------+ Mid upper arm        0.29                        +-----------------+-------------+----------+--------+ Dist upper arm       0.27                        +-----------------+-------------+----------+--------+ Antecubital  fossa    0.50                        +-----------------+-------------+----------+--------+ Prox forearm         0.20                        +-----------------+-------------+----------+--------+  Mid forearm          0.21                        +-----------------+-------------+----------+--------+ Dist forearm         0.17                        +-----------------+-------------+----------+--------+ Wrist                0.21                        +-----------------+-------------+----------+--------+  +-----------------+-------------+----------+--------+ Left Basilic     Diameter (cm)Depth (cm)Findings +-----------------+-------------+----------+--------+ Prox upper arm       0.24                        +-----------------+-------------+----------+--------+ Mid upper arm        0.22                        +-----------------+-------------+----------+--------+ Dist upper arm       0.26                        +-----------------+-------------+----------+--------+ Antecubital fossa    0.14                        +-----------------+-------------+----------+--------+   ASSESSMENT and PLAN   Chronic renal insufficiency, stage IV: We discussed proceeding with a left brachiocephalic fistula.  I discussed the risk of non-maturity and the risk of steal syndrome.  All his questions were answered.  His operation be scheduled for Thursday, January 30.  Ultrasound showed a biphasic brachial artery, however he has an excellent pulse and so I would not further evaluate this at this time.   Annamarie Major, MD [facility] [phone] Pager [phone]

## 2018-07-13 NOTE — H&P (View-Only) (Signed)
[facility] [place]  Patient name: Juan Bass MRN: [id number] DOB: [birth year] Sex: male   REQUESTING PROVIDER:    Dr. Lowanda Foster   REASON FOR CONSULT:    Dialysis access  HISTORY OF PRESENT ILLNESS:   Juan Bass is a 68 y.o. male, who is referred today for evaluation of dialysis access.  Patient renal failure secondary to hypertension and diabetes.  His creatinine most recently was 4.81.  His blood pressure has been well controlled.  He does suffer from proteinuria due to his diabetes.  He also suffers from hyperparathyroidism and is taking Calcitrol.  He is anemic with the most recent hemoglobin of 10.  His diabetes has been moderately well controlled.  He takes a statin for hypercholesterolemia.  He is right-handed.  PAST MEDICAL HISTORY    Past Medical History:  Diagnosis Date  . Chronic kidney disease   . Diabetes mellitus without complication ([place])      FAMILY HISTORY   Family History  Problem Relation Age of Onset  . Hypertension Mother   . Deep vein thrombosis Father   . Hypertension Father   . Hypertension Sister   . Hypertension Brother     SOCIAL HISTORY:   Social History   Socioeconomic History  . Marital status: Married    Spouse name: Not on file  . Number of children: Not on file  . Years of education: Not on file  . Highest education level: Not on file  Occupational History  . Not on file  Social Needs  . Financial resource strain: Not on file  . Food insecurity:    Worry: Not on file    Inability: Not on file  . Transportation needs:    Medical: Not on file    Non-medical: Not on file  Tobacco Use  . Smoking status: Current Every Day Smoker    Packs/day: 0.75    Years: 40.00    Pack years: 30.00    Types: Cigarettes  . Smokeless tobacco: Never Used  Substance and Sexual Activity  . Alcohol use: Yes    Alcohol/week: 1.0 - 2.0 standard drinks    Types: 1 - 2 Cans  of beer per week  . Drug use: No  . Sexual activity: Not on file  Lifestyle  . Physical activity:    Days per week: Not on file    Minutes per session: Not on file  . Stress: Not on file  Relationships  . Social connections:    Talks on phone: Not on file    Gets together: Not on file    Attends religious service: Not on file    Active member of club or organization: Not on file    Attends meetings of clubs or organizations: Not on file    Relationship status: Not on file  . Intimate partner violence:    Fear of current or ex partner: Not on file    Emotionally abused: Not on file    Physically abused: Not on file    Forced sexual activity: Not on file  Other Topics Concern  . Not on file  Social History Narrative  . Not on file    ALLERGIES:    No Known Allergies  CURRENT MEDICATIONS:    Current Outpatient Medications  Medication Sig Dispense Refill  . amLODipine (NORVASC) 10 MG tablet Take 10 mg by mouth daily.    Marland Kitchen aspirin EC 81 MG tablet Take 81 mg by mouth daily.    Marland Kitchen  atorvastatin (LIPITOR) 20 MG tablet Take 20 mg by mouth at bedtime. Reported on 01/24/2016    . calcitRIOL (ROCALTROL) 0.25 MCG capsule TAKE ONE CAPSULE BY MOUTH EVERY DAY    . glipiZIDE (GLUCOTROL XL) 2.5 MG 24 hr tablet Take 2.5 mg by mouth daily with breakfast.    . labetalol (NORMODYNE) 100 MG tablet Take 200 mg by mouth 2 (two) times daily.     Marland Kitchen losartan (COZAAR) 100 MG tablet Take 100 mg by mouth daily.     No current facility-administered medications for this visit.     REVIEW OF SYSTEMS:   [X]  denotes positive finding, [ ]  denotes negative finding Cardiac  Comments:  Chest pain or chest pressure:    Shortness of breath upon exertion:    Short of breath when lying flat:    Irregular heart rhythm:        Vascular    Pain in calf, thigh, or hip brought on by ambulation:    Pain in feet at night that wakes you up from your sleep:     Blood clot in your veins:    Leg swelling:          Pulmonary    Oxygen at home:    Productive cough:     Wheezing:         Neurologic    Sudden weakness in arms or legs:     Sudden numbness in arms or legs:     Sudden onset of difficulty speaking or slurred speech:    Temporary loss of vision in one eye:     Problems with dizziness:         Gastrointestinal    Blood in stool:      Vomited blood:         Genitourinary    Burning when urinating:     Blood in urine:        Psychiatric    Major depression:         Hematologic    Bleeding problems:    Problems with blood clotting too easily:        Skin    Rashes or ulcers:        Constitutional    Fever or chills:     PHYSICAL EXAM:   Vitals:   07/13/18 0953  BP: 127/78  Pulse: 73  Resp: 20  Temp: (!) 97.5 F (36.4 C)  SpO2: 100%  Weight: 182 lb (82.6 kg)  Height: 5\' 9"  (1.753 m)    GENERAL: The patient is a well-nourished male, in no acute distress. The vital signs are documented above. CARDIAC: There is a regular rate and rhythm.  VASCULAR: Palpable left brachial and radial pulse PULMONARY: Nonlabored respirations MUSCULOSKELETAL: There are no major deformities or cyanosis. NEUROLOGIC: No focal weakness or paresthesias are detected. SKIN: There are no ulcers or rashes noted. PSYCHIATRIC: The patient has a normal affect.  STUDIES:   I have ordered and reviewed the following studies:  Upper arm Arterial duplex: Right side = triphasic, left side = biphasic  Vein mapping: +-----------------+-------------+----------+--------+ Right Cephalic   Diameter (cm)Depth (cm)Findings +-----------------+-------------+----------+--------+ Shoulder             0.42                        +-----------------+-------------+----------+--------+ Prox upper arm       0.34                        +-----------------+-------------+----------+--------+  Mid upper arm        0.32                         +-----------------+-------------+----------+--------+ Dist upper arm       0.36                        +-----------------+-------------+----------+--------+ Antecubital fossa    0.71                        +-----------------+-------------+----------+--------+ Prox forearm         0.19                        +-----------------+-------------+----------+--------+ Mid forearm          0.21                        +-----------------+-------------+----------+--------+ Dist forearm         0.20                        +-----------------+-------------+----------+--------+ Wrist                0.12                        +-----------------+-------------+----------+--------+  +-----------------+-------------+----------+--------+ Right Basilic    Diameter (cm)Depth (cm)Findings +-----------------+-------------+----------+--------+ Prox upper arm       0.26                        +-----------------+-------------+----------+--------+ Mid upper arm        0.32                        +-----------------+-------------+----------+--------+ Dist upper arm       0.30                        +-----------------+-------------+----------+--------+ Antecubital fossa    0.18                        +-----------------+-------------+----------+--------+  +-----------------+-------------+----------+--------+ Left Cephalic    Diameter (cm)Depth (cm)Findings +-----------------+-------------+----------+--------+ Shoulder             0.30                        +-----------------+-------------+----------+--------+ Prox upper arm       0.30                        +-----------------+-------------+----------+--------+ Mid upper arm        0.29                        +-----------------+-------------+----------+--------+ Dist upper arm       0.27                        +-----------------+-------------+----------+--------+ Antecubital  fossa    0.50                        +-----------------+-------------+----------+--------+ Prox forearm         0.20                        +-----------------+-------------+----------+--------+  Mid forearm          0.21                        +-----------------+-------------+----------+--------+ Dist forearm         0.17                        +-----------------+-------------+----------+--------+ Wrist                0.21                        +-----------------+-------------+----------+--------+  +-----------------+-------------+----------+--------+ Left Basilic     Diameter (cm)Depth (cm)Findings +-----------------+-------------+----------+--------+ Prox upper arm       0.24                        +-----------------+-------------+----------+--------+ Mid upper arm        0.22                        +-----------------+-------------+----------+--------+ Dist upper arm       0.26                        +-----------------+-------------+----------+--------+ Antecubital fossa    0.14                        +-----------------+-------------+----------+--------+   ASSESSMENT and PLAN   Chronic renal insufficiency, stage IV: We discussed proceeding with a left brachiocephalic fistula.  I discussed the risk of non-maturity and the risk of steal syndrome.  All his questions were answered.  His operation be scheduled for Thursday, January 30.  Ultrasound showed a biphasic brachial artery, however he has an excellent pulse and so I would not further evaluate this at this time.   Annamarie Major, MD [facility] [phone] Pager [phone]

## 2018-07-20 DIAGNOSIS — I714 Abdominal aortic aneurysm, without rupture: Secondary | ICD-10-CM | POA: Diagnosis not present

## 2018-07-20 DIAGNOSIS — Z01818 Encounter for other preprocedural examination: Secondary | ICD-10-CM | POA: Diagnosis not present

## 2018-07-20 DIAGNOSIS — N186 End stage renal disease: Secondary | ICD-10-CM | POA: Diagnosis not present

## 2018-07-20 DIAGNOSIS — E1122 Type 2 diabetes mellitus with diabetic chronic kidney disease: Secondary | ICD-10-CM | POA: Diagnosis not present

## 2018-07-20 DIAGNOSIS — Z79899 Other long term (current) drug therapy: Secondary | ICD-10-CM | POA: Diagnosis not present

## 2018-07-20 DIAGNOSIS — Z7984 Long term (current) use of oral hypoglycemic drugs: Secondary | ICD-10-CM | POA: Diagnosis not present

## 2018-07-20 DIAGNOSIS — I120 Hypertensive chronic kidney disease with stage 5 chronic kidney disease or end stage renal disease: Secondary | ICD-10-CM | POA: Diagnosis not present

## 2018-07-20 DIAGNOSIS — F17210 Nicotine dependence, cigarettes, uncomplicated: Secondary | ICD-10-CM | POA: Diagnosis not present

## 2018-07-20 DIAGNOSIS — Z95828 Presence of other vascular implants and grafts: Secondary | ICD-10-CM | POA: Diagnosis not present

## 2018-07-20 DIAGNOSIS — I700 Atherosclerosis of aorta: Secondary | ICD-10-CM | POA: Diagnosis not present

## 2018-07-20 DIAGNOSIS — Z960 Presence of urogenital implants: Secondary | ICD-10-CM | POA: Diagnosis not present

## 2018-07-20 DIAGNOSIS — I708 Atherosclerosis of other arteries: Secondary | ICD-10-CM | POA: Diagnosis not present

## 2018-07-20 DIAGNOSIS — N185 Chronic kidney disease, stage 5: Secondary | ICD-10-CM | POA: Diagnosis not present

## 2018-07-28 DIAGNOSIS — Z01818 Encounter for other preprocedural examination: Secondary | ICD-10-CM | POA: Diagnosis not present

## 2018-07-28 DIAGNOSIS — I714 Abdominal aortic aneurysm, without rupture: Secondary | ICD-10-CM | POA: Diagnosis not present

## 2018-07-28 DIAGNOSIS — I739 Peripheral vascular disease, unspecified: Secondary | ICD-10-CM | POA: Diagnosis not present

## 2018-08-04 ENCOUNTER — Other Ambulatory Visit: Payer: Self-pay

## 2018-08-04 ENCOUNTER — Encounter (HOSPITAL_COMMUNITY): Payer: Self-pay | Admitting: *Deleted

## 2018-08-04 NOTE — Progress Notes (Signed)
Pt denies SOB, chest pain, and being under the care of a cardiologist. Pt denies having an echo and cardiac cath but stated that a stress test was performed > 10 years ago. Pt made aware to stop taking vitamins, fish oil and herbal medications. Do not take any NSAIDs ie: Ibuprofen, Advil, Naproxen (Aleve), Motrin, BC and Goody Powder. Pt verbalized understanding of all pre-op instructions. Requested EKG tracing from [facility]. [place], Anesthesiology, made aware of pt history of AAA.

## 2018-08-04 NOTE — Progress Notes (Signed)
Pt made aware to not take Glipizide the night before surgery and DOS. Pt stated that he does not check his blood glucose.

## 2018-08-05 NOTE — Anesthesia Preprocedure Evaluation (Addendum)
Anesthesia Evaluation  Patient identified by MRN, date of birth, ID band Patient awake    Reviewed: Allergy & Precautions, NPO status , Patient's Chart, lab work & pertinent test results  Airway Mallampati: II  TM Distance: >3 FB     Dental  (+) Edentulous Upper, Missing, Dental Advisory Given,    Pulmonary neg pulmonary ROS, former smoker,    breath sounds clear to auscultation       Cardiovascular hypertension,  Rhythm:Regular Rate:Normal     Neuro/Psych    GI/Hepatic negative GI ROS, Neg liver ROS,   Endo/Other  diabetes  Renal/GU Renal disease     Musculoskeletal   Abdominal   Peds  Hematology   Anesthesia Other Findings   Reproductive/Obstetrics                           Anesthesia Physical Anesthesia Plan  ASA: III  Anesthesia Plan: MAC   Post-op Pain Management:    Induction: Intravenous  PONV Risk Score and Plan: Treatment may vary due to age or medical condition, Dexamethasone and Midazolam  Airway Management Planned: Nasal Cannula and Simple Face Mask  Additional Equipment:   Intra-op Plan:   Post-operative Plan: Possible Post-op intubation/ventilation  Informed Consent: I have reviewed the patients History and Physical, chart, labs and discussed the procedure including the risks, benefits and alternatives for the proposed anesthesia with the patient or authorized representative who has indicated his/her understanding and acceptance.     Dental advisory given  Plan Discussed with: Anesthesiologist and CRNA  Anesthesia Plan Comments: (Follows with vascular surgery at [facility] for AAA. 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  )    Anesthesia Quick Evaluation

## 2018-08-06 ENCOUNTER — Ambulatory Visit (HOSPITAL_COMMUNITY): Payer: PPO | Admitting: Physician Assistant

## 2018-08-06 ENCOUNTER — Other Ambulatory Visit: Payer: Self-pay

## 2018-08-06 ENCOUNTER — Encounter (HOSPITAL_COMMUNITY): Payer: Self-pay | Admitting: Surgery

## 2018-08-06 ENCOUNTER — Ambulatory Visit (HOSPITAL_COMMUNITY)
Admission: RE | Admit: 2018-08-06 | Discharge: 2018-08-06 | Disposition: A | Discharge status: Home / Self Care | Payer: PPO | Attending: Surgery | Admitting: Surgery

## 2018-08-06 ENCOUNTER — Encounter (HOSPITAL_COMMUNITY)
Admission: RE | Disposition: A | Discharge status: Home / Self Care | Payer: Self-pay | Source: Home / Self Care | Attending: Surgery

## 2018-08-06 DIAGNOSIS — N186 End stage renal disease: Secondary | ICD-10-CM | POA: Diagnosis not present

## 2018-08-06 DIAGNOSIS — R809 Proteinuria, unspecified: Secondary | ICD-10-CM | POA: Diagnosis not present

## 2018-08-06 DIAGNOSIS — I129 Hypertensive chronic kidney disease with stage 1 through stage 4 chronic kidney disease, or unspecified chronic kidney disease: Secondary | ICD-10-CM | POA: Insufficient documentation

## 2018-08-06 DIAGNOSIS — N185 Chronic kidney disease, stage 5: Secondary | ICD-10-CM | POA: Diagnosis not present

## 2018-08-06 DIAGNOSIS — N184 Chronic kidney disease, stage 4 (severe): Secondary | ICD-10-CM | POA: Insufficient documentation

## 2018-08-06 DIAGNOSIS — D631 Anemia in chronic kidney disease: Secondary | ICD-10-CM | POA: Insufficient documentation

## 2018-08-06 DIAGNOSIS — Z79899 Other long term (current) drug therapy: Secondary | ICD-10-CM | POA: Diagnosis not present

## 2018-08-06 DIAGNOSIS — F17210 Nicotine dependence, cigarettes, uncomplicated: Secondary | ICD-10-CM | POA: Insufficient documentation

## 2018-08-06 DIAGNOSIS — Z7984 Long term (current) use of oral hypoglycemic drugs: Secondary | ICD-10-CM | POA: Diagnosis not present

## 2018-08-06 DIAGNOSIS — E1122 Type 2 diabetes mellitus with diabetic chronic kidney disease: Secondary | ICD-10-CM | POA: Insufficient documentation

## 2018-08-06 DIAGNOSIS — Z7982 Long term (current) use of aspirin: Secondary | ICD-10-CM | POA: Insufficient documentation

## 2018-08-06 DIAGNOSIS — E7800 Pure hypercholesterolemia, unspecified: Secondary | ICD-10-CM | POA: Insufficient documentation

## 2018-08-06 DIAGNOSIS — E213 Hyperparathyroidism, unspecified: Secondary | ICD-10-CM | POA: Diagnosis not present

## 2018-08-06 DIAGNOSIS — I120 Hypertensive chronic kidney disease with stage 5 chronic kidney disease or end stage renal disease: Secondary | ICD-10-CM | POA: Diagnosis not present

## 2018-08-06 HISTORY — DX: Essential (primary) hypertension: I10

## 2018-08-06 HISTORY — DX: Abdominal aortic aneurysm, without rupture: I71.4

## 2018-08-06 HISTORY — DX: Abdominal aortic aneurysm, without rupture, unspecified: I71.40

## 2018-08-06 HISTORY — PX: AV FISTULA PLACEMENT: SHX1204

## 2018-08-06 LAB — POCT I-STAT 4, (NA,K, GLUC, HGB,HCT)
Glucose, Bld: 117 mg/dL — ABNORMAL HIGH (ref 70–99)
HCT: 25 % — ABNORMAL LOW (ref 39.0–52.0)
Hemoglobin: 8.5 g/dL — ABNORMAL LOW (ref 13.0–17.0)
POTASSIUM: 4.5 mmol/L (ref 3.5–5.1)
Sodium: 140 mmol/L (ref 135–145)

## 2018-08-06 LAB — GLUCOSE, CAPILLARY
Glucose-Capillary: 113 mg/dL — ABNORMAL HIGH (ref 70–99)
Glucose-Capillary: 120 mg/dL — ABNORMAL HIGH (ref 70–99)

## 2018-08-06 SURGERY — ARTERIOVENOUS (AV) FISTULA CREATION
Anesthesia: General | Site: Arm Upper | Laterality: Left

## 2018-08-06 MED ORDER — LIDOCAINE 2% (20 MG/ML) 5 ML SYRINGE
INTRAMUSCULAR | Status: DC | PRN
  Administered 2018-08-06: 40 mg via INTRAVENOUS

## 2018-08-06 MED ORDER — SODIUM CHLORIDE 0.9 % IV SOLN
INTRAVENOUS | Status: DC | PRN
  Administered 2018-08-06: 07:00:00 via INTRAVENOUS

## 2018-08-06 MED ORDER — SODIUM CHLORIDE 0.9 % IV SOLN: INTRAVENOUS | Status: DC

## 2018-08-06 MED ORDER — PHENYLEPHRINE 40 MCG/ML (10ML) SYRINGE FOR IV PUSH (FOR BLOOD PRESSURE SUPPORT)
PREFILLED_SYRINGE | INTRAVENOUS | Status: DC | PRN
  Administered 2018-08-06: 40 ug via INTRAVENOUS
  Administered 2018-08-06 (×2): 80 ug via INTRAVENOUS

## 2018-08-06 MED ORDER — PROPOFOL 500 MG/50ML IV EMUL
INTRAVENOUS | Status: DC | PRN
  Administered 2018-08-06: 100 ug/kg/min via INTRAVENOUS

## 2018-08-06 MED ORDER — LIDOCAINE 2% (20 MG/ML) 5 ML SYRINGE
INTRAMUSCULAR | Status: AC
  Filled 2018-08-06: qty 5

## 2018-08-06 MED ORDER — SODIUM CHLORIDE 0.9 % IV SOLN
INTRAVENOUS | Status: DC | PRN
  Administered 2018-08-06: 35 ug/min via INTRAVENOUS

## 2018-08-06 MED ORDER — MIDAZOLAM HCL 5 MG/5ML IJ SOLN
INTRAMUSCULAR | Status: DC | PRN
  Administered 2018-08-06: 2 mg via INTRAVENOUS

## 2018-08-06 MED ORDER — FENTANYL CITRATE (PF) 100 MCG/2ML IJ SOLN
INTRAMUSCULAR | Status: DC | PRN
  Administered 2018-08-06: 50 ug via INTRAVENOUS

## 2018-08-06 MED ORDER — SODIUM CHLORIDE 0.9 % IV SOLN
INTRAVENOUS | Status: AC
  Filled 2018-08-06: qty 1.2

## 2018-08-06 MED ORDER — PROPOFOL 10 MG/ML IV BOLUS
INTRAVENOUS | Status: AC
  Filled 2018-08-06: qty 40

## 2018-08-06 MED ORDER — 0.9 % SODIUM CHLORIDE (POUR BTL) OPTIME
TOPICAL | Status: DC | PRN
  Administered 2018-08-06: 1000 mL

## 2018-08-06 MED ORDER — MIDAZOLAM HCL 2 MG/2ML IJ SOLN
INTRAMUSCULAR | Status: AC
  Filled 2018-08-06: qty 2

## 2018-08-06 MED ORDER — CEFAZOLIN SODIUM-DEXTROSE 2-4 GM/100ML-% IV SOLN
2.0000 g | INTRAVENOUS | Status: AC
  Administered 2018-08-06: 2 g via INTRAVENOUS
  Filled 2018-08-06: qty 100

## 2018-08-06 MED ORDER — HEMOSTATIC AGENTS (NO CHARGE) OPTIME
TOPICAL | Status: DC | PRN
  Administered 2018-08-06: 1 via TOPICAL

## 2018-08-06 MED ORDER — LIDOCAINE HCL (PF) 1 % IJ SOLN
INTRAMUSCULAR | Status: AC
  Filled 2018-08-06: qty 30

## 2018-08-06 MED ORDER — SODIUM CHLORIDE 0.9 % IV SOLN
INTRAVENOUS | Status: DC | PRN
  Administered 2018-08-06: 07:00:00

## 2018-08-06 MED ORDER — FENTANYL CITRATE (PF) 250 MCG/5ML IJ SOLN
INTRAMUSCULAR | Status: AC
  Filled 2018-08-06: qty 5

## 2018-08-06 MED ORDER — ONDANSETRON HCL 4 MG/2ML IJ SOLN
INTRAMUSCULAR | Status: AC
  Filled 2018-08-06: qty 2

## 2018-08-06 MED ORDER — DEXAMETHASONE SODIUM PHOSPHATE 10 MG/ML IJ SOLN
INTRAMUSCULAR | Status: AC
  Filled 2018-08-06: qty 1

## 2018-08-06 MED ORDER — FENTANYL CITRATE (PF) 100 MCG/2ML IJ SOLN: 25.0000 ug | INTRAMUSCULAR | Status: DC | PRN

## 2018-08-06 MED ORDER — LIDOCAINE-EPINEPHRINE (PF) 1 %-1:200000 IJ SOLN
INTRAMUSCULAR | Status: DC | PRN
  Administered 2018-08-06: 30 mL

## 2018-08-06 MED ORDER — CHLORHEXIDINE GLUCONATE 4 % EX LIQD: 1.0000 "application " | Freq: Once | CUTANEOUS | Status: DC

## 2018-08-06 MED ORDER — LIDOCAINE-EPINEPHRINE (PF) 1 %-1:200000 IJ SOLN
INTRAMUSCULAR | Status: AC
  Filled 2018-08-06: qty 30

## 2018-08-06 MED ORDER — OXYCODONE HCL 5 MG PO TABS: 5.0000 mg | ORAL_TABLET | Freq: Four times a day (QID) | ORAL | 0 refills | Status: DC | PRN

## 2018-08-06 MED ORDER — PHENYLEPHRINE 40 MCG/ML (10ML) SYRINGE FOR IV PUSH (FOR BLOOD PRESSURE SUPPORT)
PREFILLED_SYRINGE | INTRAVENOUS | Status: AC
  Filled 2018-08-06: qty 10

## 2018-08-06 SURGICAL SUPPLY — 37 items
ADH SKN CLS APL DERMABOND .7 (GAUZE/BANDAGES/DRESSINGS) ×1
ARMBAND PINK RESTRICT EXTREMIT (MISCELLANEOUS) ×6 IMPLANT
CANISTER SUCT 3000ML PPV (MISCELLANEOUS) ×3 IMPLANT
CLIP VESOCCLUDE MED 6/CT (CLIP) ×3 IMPLANT
CLIP VESOCCLUDE SM WIDE 6/CT (CLIP) ×5 IMPLANT
COVER PROBE W GEL 5X96 (DRAPES) ×3 IMPLANT
COVER WAND RF STERILE (DRAPES) ×3 IMPLANT
DERMABOND ADVANCED (GAUZE/BANDAGES/DRESSINGS) ×2
DERMABOND ADVANCED .7 DNX12 (GAUZE/BANDAGES/DRESSINGS) ×1 IMPLANT
ELECT REM PT RETURN 9FT ADLT (ELECTROSURGICAL) ×3
ELECTRODE REM PT RTRN 9FT ADLT (ELECTROSURGICAL) ×1 IMPLANT
GLOVE BIO SURGEON STRL SZ 6.5 (GLOVE) ×1 IMPLANT
GLOVE BIO SURGEON STRL SZ7.5 (GLOVE) ×2 IMPLANT
GLOVE BIO SURGEONS STRL SZ 6.5 (GLOVE) ×1
GLOVE BIOGEL PI IND STRL 7.0 (GLOVE) IMPLANT
GLOVE BIOGEL PI IND STRL 7.5 (GLOVE) ×1 IMPLANT
GLOVE BIOGEL PI INDICATOR 7.0 (GLOVE) ×8
GLOVE BIOGEL PI INDICATOR 7.5 (GLOVE) ×4
GLOVE SURG SS PI 7.0 STRL IVOR (GLOVE) ×2 IMPLANT
GLOVE SURG SS PI 7.5 STRL IVOR (GLOVE) ×3 IMPLANT
GOWN STRL REUS W/ TWL LRG LVL3 (GOWN DISPOSABLE) ×2 IMPLANT
GOWN STRL REUS W/ TWL XL LVL3 (GOWN DISPOSABLE) ×1 IMPLANT
GOWN STRL REUS W/TWL LRG LVL3 (GOWN DISPOSABLE) ×9
GOWN STRL REUS W/TWL XL LVL3 (GOWN DISPOSABLE) ×3
HEMOSTAT SNOW SURGICEL 2X4 (HEMOSTASIS) IMPLANT
KIT BASIN OR (CUSTOM PROCEDURE TRAY) ×3 IMPLANT
KIT TURNOVER KIT B (KITS) ×3 IMPLANT
NS IRRIG 1000ML POUR BTL (IV SOLUTION) ×3 IMPLANT
PACK CV ACCESS (CUSTOM PROCEDURE TRAY) ×3 IMPLANT
PAD ARMBOARD 7.5X6 YLW CONV (MISCELLANEOUS) ×6 IMPLANT
SUT PROLENE 6 0 CC (SUTURE) ×3 IMPLANT
SUT VIC AB 3-0 SH 27 (SUTURE) ×3
SUT VIC AB 3-0 SH 27X BRD (SUTURE) ×1 IMPLANT
SUT VICRYL 4-0 PS2 18IN ABS (SUTURE) ×3 IMPLANT
TOWEL GREEN STERILE (TOWEL DISPOSABLE) ×3 IMPLANT
UNDERPAD 30X30 (UNDERPADS AND DIAPERS) ×3 IMPLANT
WATER STERILE IRR 1000ML POUR (IV SOLUTION) ×3 IMPLANT

## 2018-08-06 NOTE — Transfer of Care (Signed)
Immediate Anesthesia Transfer of Care Note  Patient: Juan Bass  Procedure(s) Performed: ARTERIOVENOUS (AV) FISTULA CREATION (Left Arm Upper)  Patient Location: PACU  Anesthesia Type:MAC  Level of Consciousness: drowsy  Airway & Oxygen Therapy: Patient Spontanous Breathing and Patient connected to face mask oxygen  Post-op Assessment: Report given to RN and Post -op Vital signs reviewed and stable  Post vital signs: Reviewed and stable  Last Vitals:  Vitals Value Taken Time  BP 116/60 08/06/2018  8:41 AM  Temp    Pulse 63 08/06/2018  8:42 AM  Resp 18 08/06/2018  8:42 AM  SpO2 100 % 08/06/2018  8:42 AM  Vitals shown include unvalidated device data.  Last Pain:  Vitals:   08/06/18 0604  TempSrc:   PainSc: 0-No pain      Patients Stated Pain Goal: 2 (99/83/38 2505)  Complications: No apparent anesthesia complications

## 2018-08-06 NOTE — Anesthesia Procedure Notes (Signed)
Procedure Name: MAC Date/Time: 08/06/2018 7:38 AM Performed by: Colin Benton, CRNA Pre-anesthesia Checklist: Patient identified, Emergency Drugs available, Suction available and Patient being monitored Patient Re-evaluated:Patient Re-evaluated prior to induction Oxygen Delivery Method: Simple face mask Induction Type: IV induction Placement Confirmation: positive ETCO2 Dental Injury: Teeth and Oropharynx as per pre-operative assessment

## 2018-08-06 NOTE — Interval H&P Note (Signed)
History and Physical Interval Note:  08/06/2018 7:25 AM  [name]  has presented today for surgery, with the diagnosis of CHRONIC KIDNEY DISEASE FOR HEMODIALYSIS ACCESS  The various methods of treatment have been discussed with the patient and family. After consideration of risks, benefits and other options for treatment, the patient has consented to  Procedure(s): ARTERIOVENOUS (AV) FISTULA CREATION (Left) as a surgical intervention .  The patient's history has been reviewed, patient examined, no change in status, stable for surgery.  I have reviewed the patient's chart and labs.  Questions were answered to the patient's satisfaction.     Juan Bass

## 2018-08-06 NOTE — Op Note (Signed)
    Patient name: Juan Bass MRN: [id number] DOB: 1950/07/15 Sex: male  08/06/2018 Pre-operative Diagnosis: CKD Post-operative diagnosis:  Same Surgeon:  Annamarie Major Assistants:  [place] Procedure:   Left brachiocephalic fistula Anesthesia:  MAC Blood Loss:  minimal Specimens:  none  Findings:  Health 76m artery,  Good quality 442mvein  Indications:  The patient comes in today for dialysis access  Procedure:  The patient was identified in the holding area and [email or web address]  The patient was then placed supine on the table. MAC anesthesia was administered.  The patient was prepped and draped in the usual sterile fashion.  A time out was called and antibiotics were administered.  Ultrasound was used to evaluate the cephalic vein in the upper arm.  It was adequate for access.  1% lidocaine was used for local anesthesia.  A transverse incision was made just proximal to the antecubital crease.  I first dissected out the brachial artery.  This was a disease-free 4 mm artery.  It was mobilized proximally and distally and encircled [place].  Attention was then turned towards the cephalic vein.  It was circumferentially dissected free.  Side branches were ligated.  This was a 4 mm vein at the antecubital crease.  It was marked for orientation and then divided distally.  It distended nicely with heparin saline.  No heparin was given.  The brachial artery was occluded with vascular clamps.  A #11 blade was used to make an arteriotomy which was extended longitudinally with Potts scissors.  The vein was then cut to the appropriate length and spatulated to fit the size of the arteriotomy.  A running anastomosis was created with 6-0 Prolene.  Just prior to completion, the appropriate flushing maneuvers were performed and the anastomosis was completed.  I inspected the course of the vein to make sure there were no kinks or additional branches.  There was an excellent thrill within the  fistula.  There was a triphasic radial artery Doppler signal.  Hemostasis was then achieved.  The incision was closed with 2 layers with 3-0 Vicryl followed by Dermabond.  The patient was taken recovery in stable condition.   Disposition: To PACU stable.   V.Theotis BurrowM.D. [facility] [place]: [phone]:  [phone]

## 2018-08-06 NOTE — Anesthesia Postprocedure Evaluation (Signed)
Anesthesia Post Note  Patient: Juan Bass  Procedure(s) Performed: ARTERIOVENOUS (AV) FISTULA CREATION (Left Arm Upper)     Patient location during evaluation: PACU Anesthesia Type: General Level of consciousness: awake Pain management: pain level controlled Vital Signs Assessment: post-procedure vital signs reviewed and stable Respiratory status: spontaneous breathing Cardiovascular status: stable Postop Assessment: no apparent nausea or vomiting Anesthetic complications: no    Last Vitals:  Vitals:   08/06/18 0911 08/06/18 0919  BP: 129/65 125/66  Pulse: (!) 59 61  Resp: 18 19  Temp: 36.4 C   SpO2: 98% 99%    Last Pain:  Vitals:   08/06/18 0911  TempSrc:   PainSc: 0-No pain                 Luman Holway

## 2018-08-07 ENCOUNTER — Encounter (HOSPITAL_COMMUNITY): Payer: Self-pay | Admitting: Surgery

## 2018-08-26 DIAGNOSIS — N184 Chronic kidney disease, stage 4 (severe): Secondary | ICD-10-CM | POA: Diagnosis not present

## 2018-08-26 DIAGNOSIS — R809 Proteinuria, unspecified: Secondary | ICD-10-CM | POA: Diagnosis not present

## 2018-08-26 DIAGNOSIS — I10 Essential (primary) hypertension: Secondary | ICD-10-CM | POA: Diagnosis not present

## 2018-08-26 DIAGNOSIS — D509 Iron deficiency anemia, unspecified: Secondary | ICD-10-CM | POA: Diagnosis not present

## 2018-09-01 DIAGNOSIS — I10 Essential (primary) hypertension: Secondary | ICD-10-CM | POA: Diagnosis not present

## 2018-09-01 DIAGNOSIS — R809 Proteinuria, unspecified: Secondary | ICD-10-CM | POA: Diagnosis not present

## 2018-09-01 DIAGNOSIS — E889 Metabolic disorder, unspecified: Secondary | ICD-10-CM | POA: Diagnosis not present

## 2018-09-01 DIAGNOSIS — N185 Chronic kidney disease, stage 5: Secondary | ICD-10-CM | POA: Diagnosis not present

## 2018-09-01 DIAGNOSIS — M9080 Osteopathy in diseases classified elsewhere, unspecified site: Secondary | ICD-10-CM | POA: Diagnosis not present

## 2018-09-01 DIAGNOSIS — E1129 Type 2 diabetes mellitus with other diabetic kidney complication: Secondary | ICD-10-CM | POA: Diagnosis not present

## 2018-09-30 DIAGNOSIS — R809 Proteinuria, unspecified: Secondary | ICD-10-CM | POA: Diagnosis not present

## 2018-09-30 DIAGNOSIS — I10 Essential (primary) hypertension: Secondary | ICD-10-CM | POA: Diagnosis not present

## 2018-09-30 DIAGNOSIS — N185 Chronic kidney disease, stage 5: Secondary | ICD-10-CM | POA: Diagnosis not present

## 2018-09-30 DIAGNOSIS — D509 Iron deficiency anemia, unspecified: Secondary | ICD-10-CM | POA: Diagnosis not present

## 2018-10-06 DIAGNOSIS — N185 Chronic kidney disease, stage 5: Secondary | ICD-10-CM | POA: Diagnosis not present

## 2018-10-06 DIAGNOSIS — E889 Metabolic disorder, unspecified: Secondary | ICD-10-CM | POA: Diagnosis not present

## 2018-10-06 DIAGNOSIS — R809 Proteinuria, unspecified: Secondary | ICD-10-CM | POA: Diagnosis not present

## 2018-10-06 DIAGNOSIS — I10 Essential (primary) hypertension: Secondary | ICD-10-CM | POA: Diagnosis not present

## 2018-10-06 DIAGNOSIS — M9080 Osteopathy in diseases classified elsewhere, unspecified site: Secondary | ICD-10-CM | POA: Diagnosis not present

## 2018-10-06 DIAGNOSIS — E1129 Type 2 diabetes mellitus with other diabetic kidney complication: Secondary | ICD-10-CM | POA: Diagnosis not present

## 2018-10-07 DIAGNOSIS — J189 Pneumonia, unspecified organism: Secondary | ICD-10-CM | POA: Diagnosis not present

## 2018-10-07 DIAGNOSIS — Z720 Tobacco use: Secondary | ICD-10-CM | POA: Diagnosis not present

## 2018-10-07 DIAGNOSIS — M48062 Spinal stenosis, lumbar region with neurogenic claudication: Secondary | ICD-10-CM | POA: Diagnosis not present

## 2018-10-07 DIAGNOSIS — N2581 Secondary hyperparathyroidism of renal origin: Secondary | ICD-10-CM | POA: Diagnosis not present

## 2018-10-07 DIAGNOSIS — M545 Low back pain: Secondary | ICD-10-CM | POA: Diagnosis not present

## 2018-10-07 DIAGNOSIS — I472 Ventricular tachycardia: Secondary | ICD-10-CM | POA: Diagnosis not present

## 2018-10-07 DIAGNOSIS — R0902 Hypoxemia: Secondary | ICD-10-CM | POA: Diagnosis not present

## 2018-10-07 DIAGNOSIS — I5021 Acute systolic (congestive) heart failure: Secondary | ICD-10-CM | POA: Diagnosis not present

## 2018-10-07 DIAGNOSIS — M79603 Pain in arm, unspecified: Secondary | ICD-10-CM | POA: Diagnosis not present

## 2018-10-07 DIAGNOSIS — Z1211 Encounter for screening for malignant neoplasm of colon: Secondary | ICD-10-CM | POA: Diagnosis not present

## 2018-10-07 DIAGNOSIS — Z135 Encounter for screening for eye and ear disorders: Secondary | ICD-10-CM | POA: Diagnosis not present

## 2018-10-07 DIAGNOSIS — Z992 Dependence on renal dialysis: Secondary | ICD-10-CM | POA: Diagnosis not present

## 2018-10-07 DIAGNOSIS — Z23 Encounter for immunization: Secondary | ICD-10-CM | POA: Diagnosis not present

## 2018-10-07 DIAGNOSIS — Z6825 Body mass index (BMI) 25.0-25.9, adult: Secondary | ICD-10-CM | POA: Diagnosis not present

## 2018-10-07 DIAGNOSIS — E559 Vitamin D deficiency, unspecified: Secondary | ICD-10-CM | POA: Diagnosis not present

## 2018-10-07 DIAGNOSIS — Z9842 Cataract extraction status, left eye: Secondary | ICD-10-CM | POA: Diagnosis not present

## 2018-10-07 DIAGNOSIS — I4821 Permanent atrial fibrillation: Secondary | ICD-10-CM | POA: Diagnosis not present

## 2018-10-07 DIAGNOSIS — M8580 Other specified disorders of bone density and structure, unspecified site: Secondary | ICD-10-CM | POA: Diagnosis not present

## 2018-10-07 DIAGNOSIS — E785 Hyperlipidemia, unspecified: Secondary | ICD-10-CM | POA: Diagnosis not present

## 2018-10-07 DIAGNOSIS — M961 Postlaminectomy syndrome, not elsewhere classified: Secondary | ICD-10-CM | POA: Diagnosis not present

## 2018-10-07 DIAGNOSIS — E663 Overweight: Secondary | ICD-10-CM | POA: Diagnosis not present

## 2018-10-07 DIAGNOSIS — R000 Tachycardia, unspecified: Secondary | ICD-10-CM | POA: Diagnosis not present

## 2018-10-07 DIAGNOSIS — Z8261 Family history of arthritis: Secondary | ICD-10-CM | POA: Diagnosis not present

## 2018-10-07 DIAGNOSIS — E43 Unspecified severe protein-calorie malnutrition: Secondary | ICD-10-CM | POA: Diagnosis not present

## 2018-10-07 DIAGNOSIS — K648 Other hemorrhoids: Secondary | ICD-10-CM | POA: Diagnosis not present

## 2018-10-07 DIAGNOSIS — D631 Anemia in chronic kidney disease: Secondary | ICD-10-CM | POA: Diagnosis not present

## 2018-10-07 DIAGNOSIS — I120 Hypertensive chronic kidney disease with stage 5 chronic kidney disease or end stage renal disease: Secondary | ICD-10-CM | POA: Diagnosis not present

## 2018-10-07 DIAGNOSIS — Z01812 Encounter for preprocedural laboratory examination: Secondary | ICD-10-CM | POA: Diagnosis not present

## 2018-10-07 DIAGNOSIS — H5213 Myopia, bilateral: Secondary | ICD-10-CM | POA: Diagnosis not present

## 2018-10-07 DIAGNOSIS — I5042 Chronic combined systolic (congestive) and diastolic (congestive) heart failure: Secondary | ICD-10-CM | POA: Diagnosis not present

## 2018-10-07 DIAGNOSIS — Z01818 Encounter for other preprocedural examination: Secondary | ICD-10-CM | POA: Diagnosis not present

## 2018-10-07 DIAGNOSIS — B029 Zoster without complications: Secondary | ICD-10-CM | POA: Diagnosis not present

## 2018-10-07 DIAGNOSIS — Z820 Family history of epilepsy and other diseases of the nervous system: Secondary | ICD-10-CM | POA: Diagnosis not present

## 2018-10-07 DIAGNOSIS — Z90710 Acquired absence of both cervix and uterus: Secondary | ICD-10-CM | POA: Diagnosis not present

## 2018-10-07 DIAGNOSIS — N184 Chronic kidney disease, stage 4 (severe): Secondary | ICD-10-CM | POA: Diagnosis not present

## 2018-10-07 DIAGNOSIS — E119 Type 2 diabetes mellitus without complications: Secondary | ICD-10-CM | POA: Diagnosis not present

## 2018-10-07 DIAGNOSIS — F09 Unspecified mental disorder due to known physiological condition: Secondary | ICD-10-CM | POA: Diagnosis not present

## 2018-10-07 DIAGNOSIS — N186 End stage renal disease: Secondary | ICD-10-CM | POA: Diagnosis not present

## 2018-10-07 DIAGNOSIS — Z9841 Cataract extraction status, right eye: Secondary | ICD-10-CM | POA: Diagnosis not present

## 2018-10-07 DIAGNOSIS — K5730 Diverticulosis of large intestine without perforation or abscess without bleeding: Secondary | ICD-10-CM | POA: Diagnosis not present

## 2018-10-07 DIAGNOSIS — E1165 Type 2 diabetes mellitus with hyperglycemia: Secondary | ICD-10-CM | POA: Diagnosis not present

## 2018-10-07 DIAGNOSIS — F329 Major depressive disorder, single episode, unspecified: Secondary | ICD-10-CM | POA: Diagnosis not present

## 2018-10-07 DIAGNOSIS — Z1231 Encounter for screening mammogram for malignant neoplasm of breast: Secondary | ICD-10-CM | POA: Diagnosis not present

## 2018-10-07 DIAGNOSIS — Z79899 Other long term (current) drug therapy: Secondary | ICD-10-CM | POA: Diagnosis not present

## 2018-10-07 DIAGNOSIS — I099 Rheumatic heart disease, unspecified: Secondary | ICD-10-CM | POA: Diagnosis not present

## 2018-10-07 DIAGNOSIS — M5124 Other intervertebral disc displacement, thoracic region: Secondary | ICD-10-CM | POA: Diagnosis not present

## 2018-10-07 DIAGNOSIS — R922 Inconclusive mammogram: Secondary | ICD-10-CM | POA: Diagnosis not present

## 2018-10-07 DIAGNOSIS — Z79891 Long term (current) use of opiate analgesic: Secondary | ICD-10-CM | POA: Diagnosis not present

## 2018-10-07 DIAGNOSIS — I779 Disorder of arteries and arterioles, unspecified: Secondary | ICD-10-CM | POA: Diagnosis not present

## 2018-10-07 DIAGNOSIS — M5136 Other intervertebral disc degeneration, lumbar region: Secondary | ICD-10-CM | POA: Diagnosis not present

## 2018-10-07 DIAGNOSIS — Z9049 Acquired absence of other specified parts of digestive tract: Secondary | ICD-10-CM | POA: Diagnosis not present

## 2018-10-07 DIAGNOSIS — Z111 Encounter for screening for respiratory tuberculosis: Secondary | ICD-10-CM | POA: Diagnosis not present

## 2018-10-07 DIAGNOSIS — E7849 Other hyperlipidemia: Secondary | ICD-10-CM | POA: Diagnosis not present

## 2018-10-07 DIAGNOSIS — A6929 Other conditions associated with Lyme disease: Secondary | ICD-10-CM | POA: Diagnosis not present

## 2018-10-07 DIAGNOSIS — G4730 Sleep apnea, unspecified: Secondary | ICD-10-CM | POA: Diagnosis not present

## 2018-10-07 DIAGNOSIS — Z886 Allergy status to analgesic agent status: Secondary | ICD-10-CM | POA: Diagnosis not present

## 2018-10-07 DIAGNOSIS — F17210 Nicotine dependence, cigarettes, uncomplicated: Secondary | ICD-10-CM | POA: Diagnosis not present

## 2018-10-07 DIAGNOSIS — M5127 Other intervertebral disc displacement, lumbosacral region: Secondary | ICD-10-CM | POA: Diagnosis not present

## 2018-10-07 DIAGNOSIS — G894 Chronic pain syndrome: Secondary | ICD-10-CM | POA: Diagnosis not present

## 2018-10-07 DIAGNOSIS — M109 Gout, unspecified: Secondary | ICD-10-CM | POA: Diagnosis not present

## 2018-10-07 DIAGNOSIS — E1142 Type 2 diabetes mellitus with diabetic polyneuropathy: Secondary | ICD-10-CM | POA: Diagnosis not present

## 2018-10-07 DIAGNOSIS — R531 Weakness: Secondary | ICD-10-CM | POA: Diagnosis not present

## 2018-10-07 DIAGNOSIS — Z885 Allergy status to narcotic agent status: Secondary | ICD-10-CM | POA: Diagnosis not present

## 2018-10-07 DIAGNOSIS — Z801 Family history of malignant neoplasm of trachea, bronchus and lung: Secondary | ICD-10-CM | POA: Diagnosis not present

## 2018-10-07 DIAGNOSIS — J449 Chronic obstructive pulmonary disease, unspecified: Secondary | ICD-10-CM | POA: Diagnosis not present

## 2018-10-07 DIAGNOSIS — Z1389 Encounter for screening for other disorder: Secondary | ICD-10-CM | POA: Diagnosis not present

## 2018-10-07 DIAGNOSIS — F321 Major depressive disorder, single episode, moderate: Secondary | ICD-10-CM | POA: Diagnosis not present

## 2018-10-07 DIAGNOSIS — R52 Pain, unspecified: Secondary | ICD-10-CM | POA: Diagnosis not present

## 2018-10-07 DIAGNOSIS — Z6832 Body mass index (BMI) 32.0-32.9, adult: Secondary | ICD-10-CM | POA: Diagnosis not present

## 2018-10-07 DIAGNOSIS — I10 Essential (primary) hypertension: Secondary | ICD-10-CM | POA: Diagnosis not present

## 2018-10-07 DIAGNOSIS — Z1159 Encounter for screening for other viral diseases: Secondary | ICD-10-CM | POA: Diagnosis not present

## 2018-10-07 DIAGNOSIS — Z719 Counseling, unspecified: Secondary | ICD-10-CM | POA: Diagnosis not present

## 2018-10-07 DIAGNOSIS — Z833 Family history of diabetes mellitus: Secondary | ICD-10-CM | POA: Diagnosis not present

## 2018-10-07 DIAGNOSIS — E875 Hyperkalemia: Secondary | ICD-10-CM | POA: Diagnosis not present

## 2018-10-07 DIAGNOSIS — R5383 Other fatigue: Secondary | ICD-10-CM | POA: Diagnosis not present

## 2018-10-07 DIAGNOSIS — Z7951 Long term (current) use of inhaled steroids: Secondary | ICD-10-CM | POA: Diagnosis not present

## 2018-10-07 DIAGNOSIS — G4733 Obstructive sleep apnea (adult) (pediatric): Secondary | ICD-10-CM | POA: Diagnosis not present

## 2018-10-07 DIAGNOSIS — E782 Mixed hyperlipidemia: Secondary | ICD-10-CM | POA: Diagnosis not present

## 2018-10-07 DIAGNOSIS — Z7982 Long term (current) use of aspirin: Secondary | ICD-10-CM | POA: Diagnosis not present

## 2018-10-08 ENCOUNTER — Other Ambulatory Visit: Payer: Self-pay

## 2018-10-08 ENCOUNTER — Telehealth (HOSPITAL_COMMUNITY): Payer: Self-pay | Admitting: Rehabilitation

## 2018-10-08 DIAGNOSIS — N185 Chronic kidney disease, stage 5: Secondary | ICD-10-CM

## 2018-10-08 NOTE — Telephone Encounter (Signed)
The above patient or their representative was contacted and gave the following answers to these questions:         Do you have any of the following symptoms? No  Fever                    Cough                   Shortness of breath  Do  you have any of the following other symptoms? No    muscle pain         vomiting,        diarrhea        rash         weakness        red eye        abdominal pain         bruising          bruising or bleeding              joint pain           severe headache    Have you been in contact with someone who was or has been sick in the past 2 weeks? No  Yes                 Unsure                         Unable to assess   Does the person that you were in contact with have any of the following symptoms? No  Cough         shortness of breath           muscle pain         vomiting,            diarrhea            rash            weakness           fever            red eye           abdominal pain           bruising  or  bleeding                joint pain                severe headache               Have you  or someone you have been in contact with traveled internationally in th last month? No        If yes, which countries?   Have you  or someone you have been in contact with traveled outside [place] in th last month? No         If yes, which state and city?   COMMENTS OR ACTION PLAN FOR THIS PATIENT:

## 2018-10-09 ENCOUNTER — Other Ambulatory Visit: Payer: Self-pay | Admitting: *Deleted

## 2018-10-09 ENCOUNTER — Encounter: Payer: Self-pay | Admitting: Vascular Surgery

## 2018-10-09 ENCOUNTER — Encounter: Payer: Self-pay | Admitting: *Deleted

## 2018-10-09 ENCOUNTER — Other Ambulatory Visit: Payer: Self-pay

## 2018-10-09 ENCOUNTER — Ambulatory Visit (INDEPENDENT_AMBULATORY_CARE_PROVIDER_SITE_OTHER): Payer: Self-pay | Admitting: Vascular Surgery

## 2018-10-09 ENCOUNTER — Ambulatory Visit (HOSPITAL_COMMUNITY)
Admission: RE | Admit: 2018-10-09 | Discharge: 2018-10-09 | Disposition: A | Discharge status: Home / Self Care | Payer: PPO | Source: Ambulatory Visit | Attending: Family | Admitting: Family

## 2018-10-09 VITALS — BP 142/76 | HR 69 | Temp 98.6°F | Resp 20 | Ht 69.0 in | Wt 172.6 lb

## 2018-10-09 DIAGNOSIS — D472 Monoclonal gammopathy: Secondary | ICD-10-CM | POA: Diagnosis not present

## 2018-10-09 DIAGNOSIS — N185 Chronic kidney disease, stage 5: Secondary | ICD-10-CM

## 2018-10-09 DIAGNOSIS — H538 Other visual disturbances: Secondary | ICD-10-CM | POA: Diagnosis not present

## 2018-10-09 NOTE — Progress Notes (Signed)
Office visit    History of Present Illness: This is a 68 y.o. male with chronic kidney disease.  He has previously undergone left brachiocephalic AV fistula.  This is not been evaluated postoperatively.  He is not on dialysis but is imminent.  Does not have any left hand pain.  Dialysis duplex performed prior to today's visit.  Past Medical History:  Diagnosis Date  . AAA (abdominal aortic aneurysm) ([place])   . Chronic kidney disease   . Diabetes mellitus without complication ([place])   . Hypertension     Past Surgical History:  Procedure Laterality Date  . ABDOMINAL AORTAGRAM N/A 02/22/2014   Procedure: ABDOMINAL Maxcine Ham;  Surgeon: Serafina Mitchell, MD;  Location: [facility] CATH LAB;  Service: Cardiovascular;  Laterality: N/A;  . AV FISTULA PLACEMENT Left 08/06/2018   Procedure: ARTERIOVENOUS (AV) FISTULA CREATION;  Surgeon: Serafina Mitchell, MD;  Location: [place];  Service: Vascular;  Laterality: Left;  . BACK SURGERY    . SPINE SURGERY      No Known Allergies  Prior to Admission medications   Medication Sig Start Date End Date Taking? Authorizing Provider  amLODipine (NORVASC) 10 MG tablet Take 10 mg by mouth daily.   Yes [provider]  aspirin EC 81 MG tablet Take 81 mg by mouth daily.   Yes [provider]  atorvastatin (LIPITOR) 40 MG tablet Take 40 mg by mouth daily.    Yes [provider]  calcitRIOL (ROCALTROL) 0.5 MCG capsule Take 0.5 mcg by mouth daily.  06/16/18  Yes [provider]  Cholecalciferol (VITAMIN D3 PO) Take 1 capsule by mouth daily.   Yes [provider]  glipiZIDE (GLUCOTROL XL) 2.5 MG 24 hr tablet Take 2.5 mg by mouth 2 (two) times daily.    Yes [provider]  labetalol (NORMODYNE) 200 MG tablet Take 300 mg by mouth 2 (two) times daily.    Yes [provider]    Social History   Socioeconomic History  . Marital status: Married    Spouse name: Not on file  . Number of children: Not on file  .  Years of education: Not on file  . Highest education level: Not on file  Occupational History  . Not on file  Social Needs  . Financial resource strain: Not on file  . Food insecurity:    Worry: Not on file    Inability: Not on file  . Transportation needs:    Medical: Not on file    Non-medical: Not on file  Tobacco Use  . Smoking status: Former Smoker    Packs/day: 0.75    Years: 40.00    Pack years: 30.00    Types: Cigarettes    Last attempt to quit: 07/24/2018    Years since quitting: 0.2  . Smokeless tobacco: Never Used  Substance and Sexual Activity  . Alcohol use: Yes    Alcohol/week: 1.0 - 2.0 standard drinks    Types: 1 - 2 Cans of beer per week    Comment: social   . Drug use: No  . Sexual activity: Not on file  Lifestyle  . Physical activity:    Days per week: Not on file    Minutes per session: Not on file  . Stress: Not on file  Relationships  . Social connections:    Talks on phone: Not on file    Gets together: Not on file    Attends religious service: Not on file  Active member of club or organization: Not on file    Attends meetings of clubs or organizations: Not on file    Relationship status: Not on file  . Intimate partner violence:    Fear of current or ex partner: Not on file    Emotionally abused: Not on file    Physically abused: Not on file    Forced sexual activity: Not on file  Other Topics Concern  . Not on file  Social History Narrative  . Not on file    Family History  Problem Relation Age of Onset  . Hypertension Mother   . Deep vein thrombosis Father   . Hypertension Father   . Hypertension Sister   . Hypertension Brother     ROS:  Cardiovascular: []  chest pain/pressure []  palpitations []  SOB lying flat []  DOE []  pain in legs while walking []  pain in legs at rest []  pain in legs at night []  non-healing ulcers []  hx of DVT []  swelling in legs  Pulmonary: []  productive cough []  asthma/wheezing []  home O2   Neurologic: []  weakness in []  arms []  legs []  numbness in []  arms []  legs []  hx of CVA []  mini stroke [] difficulty speaking or slurred speech []  temporary loss of vision in one eye []  dizziness  Hematologic: []  hx of cancer []  bleeding problems []  problems with blood clotting easily  Endocrine:   []  diabetes []  thyroid disease  GI []  vomiting blood []  blood in stool  GU: []  CKD/renal failure []  HD--[]  M/W/F or []  T/T/S []  burning with urination []  blood in urine  Psychiatric: []  anxiety []  depression  Musculoskeletal: []  arthritis []  joint pain  Integumentary: []  rashes []  ulcers  Constitutional: []  fever []  chills   Physical Examination  Vitals:   10/09/18 1019  BP: (!) 142/76  Pulse: 69  Resp: 20  Temp: 98.6 F (37 C)  SpO2: 100%   Body mass index is 25.49 kg/m.  General: No acute distress HENT: WNL, normocephalic Pulmonary: normal non-labored breathing Cardiac: Palpable radial pulses bilaterally Extremities: Left upper extremity fistula has pulsatility towards the antecubitum I cannot really feel the fistula more cephalad Neurologic: A&O X 3; Appropriate Affect ; SENSATION: normal; MOTOR FUNCTION:  moving all extremities equally. Speech is fluent/normal   CBC    Component Value Date/Time   HGB 8.5 (L) 08/06/2018 0618   HCT 25.0 (L) 08/06/2018 0618    BMET    Component Value Date/Time   NA 140 08/06/2018 0618   K 4.5 08/06/2018 0618   CL 109 02/22/2014 0724   GLUCOSE 117 (H) 08/06/2018 0618   BUN 16 02/22/2014 0724   CREATININE 1.80 (H) 02/22/2014 0724    COAGS: No results found for: INR, PROTIME   Non-Invasive Vascular Imaging: I have independently interpreted his dialysis duplex had flow volume 552 mL/min diameter is 0.67 cm in the antecubitum diminishes down to 0.54 quickly.  ASSESSMENT/PLAN: This is a 68 y.o. male who presents for evaluation of fistula with imminent dialysis per the patient.  I discussed proceeding with left  arm fistulogram we have cleared this with his nephrologist.  I also discussed that he may need [facility] although he is asymptomatic at this time and his nephrologist does not believe this to be the case at this time but is okay with fistulogram with minimal contrast exposure.  I discussed this with the patient he demonstrates good understanding we will get him scheduled next week.  Brandon C. Donzetta Matters, MD [facility] [place] Office: [phone] Pager: [phone]

## 2018-10-14 ENCOUNTER — Other Ambulatory Visit: Payer: Self-pay

## 2018-10-14 ENCOUNTER — Ambulatory Visit (HOSPITAL_COMMUNITY)
Admission: RE | Admit: 2018-10-14 | Discharge: 2018-10-14 | Disposition: A | Discharge status: Home / Self Care | Payer: PPO | Attending: Vascular Surgery | Admitting: Vascular Surgery

## 2018-10-14 ENCOUNTER — Encounter (HOSPITAL_COMMUNITY)
Admission: RE | Disposition: A | Discharge status: Home / Self Care | Payer: Self-pay | Source: Home / Self Care | Attending: Vascular Surgery

## 2018-10-14 DIAGNOSIS — E1122 Type 2 diabetes mellitus with diabetic chronic kidney disease: Secondary | ICD-10-CM | POA: Insufficient documentation

## 2018-10-14 DIAGNOSIS — K921 Melena: Secondary | ICD-10-CM | POA: Diagnosis not present

## 2018-10-14 DIAGNOSIS — L03011 Cellulitis of right finger: Secondary | ICD-10-CM | POA: Diagnosis not present

## 2018-10-14 DIAGNOSIS — Z13820 Encounter for screening for osteoporosis: Secondary | ICD-10-CM | POA: Diagnosis not present

## 2018-10-14 DIAGNOSIS — Z7984 Long term (current) use of oral hypoglycemic drugs: Secondary | ICD-10-CM | POA: Insufficient documentation

## 2018-10-14 DIAGNOSIS — R1032 Left lower quadrant pain: Secondary | ICD-10-CM | POA: Diagnosis not present

## 2018-10-14 DIAGNOSIS — G8911 Acute pain due to trauma: Secondary | ICD-10-CM | POA: Diagnosis not present

## 2018-10-14 DIAGNOSIS — M79604 Pain in right leg: Secondary | ICD-10-CM | POA: Diagnosis not present

## 2018-10-14 DIAGNOSIS — L570 Actinic keratosis: Secondary | ICD-10-CM | POA: Diagnosis not present

## 2018-10-14 DIAGNOSIS — Z780 Asymptomatic menopausal state: Secondary | ICD-10-CM | POA: Diagnosis not present

## 2018-10-14 DIAGNOSIS — Z1211 Encounter for screening for malignant neoplasm of colon: Secondary | ICD-10-CM | POA: Diagnosis not present

## 2018-10-14 DIAGNOSIS — T82510A Breakdown (mechanical) of surgically created arteriovenous fistula, initial encounter: Secondary | ICD-10-CM | POA: Insufficient documentation

## 2018-10-14 DIAGNOSIS — R350 Frequency of micturition: Secondary | ICD-10-CM | POA: Diagnosis not present

## 2018-10-14 DIAGNOSIS — Z681 Body mass index (BMI) 19 or less, adult: Secondary | ICD-10-CM | POA: Diagnosis not present

## 2018-10-14 DIAGNOSIS — Z23 Encounter for immunization: Secondary | ICD-10-CM | POA: Diagnosis not present

## 2018-10-14 DIAGNOSIS — Z992 Dependence on renal dialysis: Secondary | ICD-10-CM | POA: Insufficient documentation

## 2018-10-14 DIAGNOSIS — Z7982 Long term (current) use of aspirin: Secondary | ICD-10-CM | POA: Insufficient documentation

## 2018-10-14 DIAGNOSIS — N401 Enlarged prostate with lower urinary tract symptoms: Secondary | ICD-10-CM | POA: Diagnosis not present

## 2018-10-14 DIAGNOSIS — R197 Diarrhea, unspecified: Secondary | ICD-10-CM | POA: Diagnosis not present

## 2018-10-14 DIAGNOSIS — Z8249 Family history of ischemic heart disease and other diseases of the circulatory system: Secondary | ICD-10-CM | POA: Insufficient documentation

## 2018-10-14 DIAGNOSIS — N189 Chronic kidney disease, unspecified: Secondary | ICD-10-CM | POA: Insufficient documentation

## 2018-10-14 DIAGNOSIS — I110 Hypertensive heart disease with heart failure: Secondary | ICD-10-CM | POA: Diagnosis not present

## 2018-10-14 DIAGNOSIS — I129 Hypertensive chronic kidney disease with stage 1 through stage 4 chronic kidney disease, or unspecified chronic kidney disease: Secondary | ICD-10-CM | POA: Insufficient documentation

## 2018-10-14 DIAGNOSIS — M19039 Primary osteoarthritis, unspecified wrist: Secondary | ICD-10-CM | POA: Diagnosis not present

## 2018-10-14 DIAGNOSIS — Z1389 Encounter for screening for other disorder: Secondary | ICD-10-CM | POA: Diagnosis not present

## 2018-10-14 DIAGNOSIS — Z87891 Personal history of nicotine dependence: Secondary | ICD-10-CM | POA: Insufficient documentation

## 2018-10-14 DIAGNOSIS — C3412 Malignant neoplasm of upper lobe, left bronchus or lung: Secondary | ICD-10-CM | POA: Diagnosis not present

## 2018-10-14 DIAGNOSIS — H9113 Presbycusis, bilateral: Secondary | ICD-10-CM | POA: Diagnosis not present

## 2018-10-14 DIAGNOSIS — Y841 Kidney dialysis as the cause of abnormal reaction of the patient, or of later complication, without mention of misadventure at the time of the procedure: Secondary | ICD-10-CM | POA: Diagnosis not present

## 2018-10-14 DIAGNOSIS — D649 Anemia, unspecified: Secondary | ICD-10-CM | POA: Diagnosis not present

## 2018-10-14 DIAGNOSIS — I5033 Acute on chronic diastolic (congestive) heart failure: Secondary | ICD-10-CM | POA: Diagnosis not present

## 2018-10-14 DIAGNOSIS — R739 Hyperglycemia, unspecified: Secondary | ICD-10-CM | POA: Diagnosis not present

## 2018-10-14 DIAGNOSIS — R05 Cough: Secondary | ICD-10-CM | POA: Diagnosis not present

## 2018-10-14 DIAGNOSIS — I712 Thoracic aortic aneurysm, without rupture: Secondary | ICD-10-CM | POA: Diagnosis not present

## 2018-10-14 DIAGNOSIS — K1230 Oral mucositis (ulcerative), unspecified: Secondary | ICD-10-CM | POA: Diagnosis not present

## 2018-10-14 DIAGNOSIS — E039 Hypothyroidism, unspecified: Secondary | ICD-10-CM | POA: Diagnosis not present

## 2018-10-14 DIAGNOSIS — K449 Diaphragmatic hernia without obstruction or gangrene: Secondary | ICD-10-CM | POA: Diagnosis not present

## 2018-10-14 DIAGNOSIS — D72819 Decreased white blood cell count, unspecified: Secondary | ICD-10-CM | POA: Diagnosis not present

## 2018-10-14 DIAGNOSIS — C7951 Secondary malignant neoplasm of bone: Secondary | ICD-10-CM | POA: Diagnosis not present

## 2018-10-14 DIAGNOSIS — C7949 Secondary malignant neoplasm of other parts of nervous system: Secondary | ICD-10-CM | POA: Diagnosis not present

## 2018-10-14 DIAGNOSIS — Z0000 Encounter for general adult medical examination without abnormal findings: Secondary | ICD-10-CM | POA: Diagnosis not present

## 2018-10-14 DIAGNOSIS — R42 Dizziness and giddiness: Secondary | ICD-10-CM | POA: Diagnosis not present

## 2018-10-14 DIAGNOSIS — J9600 Acute respiratory failure, unspecified whether with hypoxia or hypercapnia: Secondary | ICD-10-CM | POA: Diagnosis not present

## 2018-10-14 DIAGNOSIS — Z7901 Long term (current) use of anticoagulants: Secondary | ICD-10-CM | POA: Diagnosis not present

## 2018-10-14 DIAGNOSIS — L718 Other rosacea: Secondary | ICD-10-CM | POA: Diagnosis not present

## 2018-10-14 DIAGNOSIS — E119 Type 2 diabetes mellitus without complications: Secondary | ICD-10-CM | POA: Diagnosis not present

## 2018-10-14 DIAGNOSIS — Z79899 Other long term (current) drug therapy: Secondary | ICD-10-CM | POA: Insufficient documentation

## 2018-10-14 DIAGNOSIS — I482 Chronic atrial fibrillation, unspecified: Secondary | ICD-10-CM | POA: Diagnosis not present

## 2018-10-14 DIAGNOSIS — E860 Dehydration: Secondary | ICD-10-CM | POA: Diagnosis not present

## 2018-10-14 DIAGNOSIS — R339 Retention of urine, unspecified: Secondary | ICD-10-CM | POA: Diagnosis not present

## 2018-10-14 DIAGNOSIS — R972 Elevated prostate specific antigen [PSA]: Secondary | ICD-10-CM | POA: Diagnosis not present

## 2018-10-14 DIAGNOSIS — N185 Chronic kidney disease, stage 5: Secondary | ICD-10-CM | POA: Diagnosis not present

## 2018-10-14 DIAGNOSIS — C3411 Malignant neoplasm of upper lobe, right bronchus or lung: Secondary | ICD-10-CM | POA: Diagnosis not present

## 2018-10-14 DIAGNOSIS — I5022 Chronic systolic (congestive) heart failure: Secondary | ICD-10-CM | POA: Diagnosis not present

## 2018-10-14 DIAGNOSIS — R195 Other fecal abnormalities: Secondary | ICD-10-CM | POA: Diagnosis not present

## 2018-10-14 DIAGNOSIS — J15212 Pneumonia due to Methicillin resistant Staphylococcus aureus: Secondary | ICD-10-CM | POA: Diagnosis not present

## 2018-10-14 DIAGNOSIS — C189 Malignant neoplasm of colon, unspecified: Secondary | ICD-10-CM | POA: Diagnosis not present

## 2018-10-14 DIAGNOSIS — R300 Dysuria: Secondary | ICD-10-CM | POA: Diagnosis not present

## 2018-10-14 DIAGNOSIS — N3941 Urge incontinence: Secondary | ICD-10-CM | POA: Diagnosis not present

## 2018-10-14 DIAGNOSIS — Z0001 Encounter for general adult medical examination with abnormal findings: Secondary | ICD-10-CM | POA: Diagnosis not present

## 2018-10-14 DIAGNOSIS — I160 Hypertensive urgency: Secondary | ICD-10-CM | POA: Diagnosis not present

## 2018-10-14 DIAGNOSIS — E538 Deficiency of other specified B group vitamins: Secondary | ICD-10-CM | POA: Diagnosis not present

## 2018-10-14 DIAGNOSIS — G4733 Obstructive sleep apnea (adult) (pediatric): Secondary | ICD-10-CM | POA: Diagnosis not present

## 2018-10-14 DIAGNOSIS — Z125 Encounter for screening for malignant neoplasm of prostate: Secondary | ICD-10-CM | POA: Diagnosis not present

## 2018-10-14 DIAGNOSIS — E785 Hyperlipidemia, unspecified: Secondary | ICD-10-CM | POA: Diagnosis not present

## 2018-10-14 DIAGNOSIS — C7931 Secondary malignant neoplasm of brain: Secondary | ICD-10-CM | POA: Diagnosis not present

## 2018-10-14 DIAGNOSIS — R251 Tremor, unspecified: Secondary | ICD-10-CM | POA: Diagnosis not present

## 2018-10-14 DIAGNOSIS — I70219 Atherosclerosis of native arteries of extremities with intermittent claudication, unspecified extremity: Secondary | ICD-10-CM | POA: Diagnosis not present

## 2018-10-14 DIAGNOSIS — I10 Essential (primary) hypertension: Secondary | ICD-10-CM | POA: Diagnosis not present

## 2018-10-14 DIAGNOSIS — D509 Iron deficiency anemia, unspecified: Secondary | ICD-10-CM | POA: Diagnosis not present

## 2018-10-14 DIAGNOSIS — T794XXA Traumatic shock, initial encounter: Secondary | ICD-10-CM | POA: Diagnosis not present

## 2018-10-14 DIAGNOSIS — R002 Palpitations: Secondary | ICD-10-CM | POA: Diagnosis not present

## 2018-10-14 DIAGNOSIS — E782 Mixed hyperlipidemia: Secondary | ICD-10-CM | POA: Diagnosis not present

## 2018-10-14 DIAGNOSIS — Z20828 Contact with and (suspected) exposure to other viral communicable diseases: Secondary | ICD-10-CM | POA: Diagnosis not present

## 2018-10-14 DIAGNOSIS — I739 Peripheral vascular disease, unspecified: Secondary | ICD-10-CM | POA: Diagnosis not present

## 2018-10-14 DIAGNOSIS — R531 Weakness: Secondary | ICD-10-CM | POA: Diagnosis not present

## 2018-10-14 DIAGNOSIS — N529 Male erectile dysfunction, unspecified: Secondary | ICD-10-CM | POA: Diagnosis not present

## 2018-10-14 DIAGNOSIS — M79605 Pain in left leg: Secondary | ICD-10-CM | POA: Diagnosis not present

## 2018-10-14 HISTORY — PX: PERIPHERAL VASCULAR BALLOON ANGIOPLASTY: CATH118281

## 2018-10-14 HISTORY — PX: A/V FISTULAGRAM: CATH118298

## 2018-10-14 LAB — POCT I-STAT CREATININE: Creatinine, Ser: 7.6 mg/dL — ABNORMAL HIGH (ref 0.61–1.24)

## 2018-10-14 LAB — POCT I-STAT 4, (NA,K, GLUC, HGB,HCT)
Glucose, Bld: 118 mg/dL — ABNORMAL HIGH (ref 70–99)
HCT: 27 % — ABNORMAL LOW (ref 39.0–52.0)
Hemoglobin: 9.2 g/dL — ABNORMAL LOW (ref 13.0–17.0)
Potassium: 4.8 mmol/L (ref 3.5–5.1)
Sodium: 138 mmol/L (ref 135–145)

## 2018-10-14 LAB — GLUCOSE, CAPILLARY: Glucose-Capillary: 95 mg/dL (ref 70–99)

## 2018-10-14 SURGERY — A/V FISTULAGRAM
Anesthesia: LOCAL | Laterality: Left

## 2018-10-14 MED ORDER — HEPARIN (PORCINE) IN NACL 1000-0.9 UT/500ML-% IV SOLN
INTRAVENOUS | Status: DC | PRN
  Administered 2018-10-14: 500 mL

## 2018-10-14 MED ORDER — HEPARIN SODIUM (PORCINE) 1000 UNIT/ML IJ SOLN
INTRAMUSCULAR | Status: DC | PRN
  Administered 2018-10-14: 3000 [IU] via INTRAVENOUS

## 2018-10-14 MED ORDER — LIDOCAINE HCL (PF) 1 % IJ SOLN
INTRAMUSCULAR | Status: DC | PRN
  Administered 2018-10-14: 2 mL

## 2018-10-14 MED ORDER — SODIUM CHLORIDE 0.9 % IV SOLN
INTRAVENOUS | Status: DC
  Administered 2018-10-14: 10:00:00 via INTRAVENOUS

## 2018-10-14 MED ORDER — HEPARIN SODIUM (PORCINE) 1000 UNIT/ML IJ SOLN
INTRAMUSCULAR | Status: AC
  Filled 2018-10-14: qty 1

## 2018-10-14 MED ORDER — HEPARIN (PORCINE) IN NACL 1000-0.9 UT/500ML-% IV SOLN
INTRAVENOUS | Status: AC
  Filled 2018-10-14: qty 500

## 2018-10-14 MED ORDER — LIDOCAINE HCL (PF) 1 % IJ SOLN
INTRAMUSCULAR | Status: AC
  Filled 2018-10-14: qty 30

## 2018-10-14 MED ORDER — IODIXANOL 320 MG/ML IV SOLN
INTRAVENOUS | Status: DC | PRN
  Administered 2018-10-14: 13:00:00 44 mL via INTRAVENOUS

## 2018-10-14 SURGICAL SUPPLY — 17 items
BAG SNAP BAND KOVER 36X36 (MISCELLANEOUS) ×2 IMPLANT
BALLN LUTONIX AV 6X60X75 (BALLOONS) ×2
BALLN MUSTANG 5.0X40 75 (BALLOONS) ×2
BALLOON LUTONIX AV 6X60X75 (BALLOONS) IMPLANT
BALLOON MUSTANG 5.0X40 75 (BALLOONS) IMPLANT
COVER DOME SNAP 22 D (MISCELLANEOUS) ×2 IMPLANT
KIT ENCORE 26 ADVANTAGE (KITS) ×1 IMPLANT
KIT MICROPUNCTURE NIT STIFF (SHEATH) ×1 IMPLANT
PROTECTION STATION PRESSURIZED (MISCELLANEOUS) ×2
SHEATH PINNACLE R/O II 6F 4CM (SHEATH) ×1 IMPLANT
SHEATH PROBE COVER 6X72 (BAG) ×2 IMPLANT
STATION PROTECTION PRESSURIZED (MISCELLANEOUS) ×1 IMPLANT
STOPCOCK MORSE 400PSI 3WAY (MISCELLANEOUS) ×2 IMPLANT
TRAY PV CATH (CUSTOM PROCEDURE TRAY) ×2 IMPLANT
TUBING CIL FLEX 10 FLL-RA (TUBING) ×2 IMPLANT
WIRE BENTSON .035X145CM (WIRE) ×1 IMPLANT
WIRE ROSEN-J .035X260CM (WIRE) ×1 IMPLANT

## 2018-10-14 NOTE — Op Note (Signed)
OPERATIVE NOTE   PROCEDURE: 1. Left brachiocephalic arteriovenous fistula cannulation under ultrasound guidance 2. Left arm fistulogram including central venogram 3. Left arm venoplasty (5 mm x 40 mm Mustang and 6 mm x 60 mm drug coated Lutonix)  PRE-OPERATIVE DIAGNOSIS: Malfunctioning left arteriovenous fistula (slow to mature)  POST-OPERATIVE DIAGNOSIS: same as above   SURGEON: Marty Heck, MD  ANESTHESIA: local  ESTIMATED BLOOD LOSS: 5 cc  FINDING(S): 1. No evidence of central venous stenosis.  The left upper arm cephalic vein was widely patent except for a focal area that appeared to be a valve in the distal upper arm just above the elbow.  At this location the patient had a pulse on exam and contrast was refluxing back into the artery with no upper arm compression of the fistula.  This was angioplastied with a 5 mm Mustang and then 6 mm drug-coated Lutonix.  After this the patient had an excellent thrill versus a pulse at the start of the case.  In addition all the contrast appeared empty up the fistula and no contrast refluxed back into the artery as evident on the first image.  SPECIMEN(S):  None  CONTRAST: 40 cc  INDICATIONS: Juan Bass is a 68 y.o. male who  presents with malfunctioning left brachiocephalic arteriovenous fistula.  The patient is scheduled for left arm fistulogram.  The patient is aware the risks include but are not limited to: bleeding, infection, thrombosis of the cannulated access, and possible anaphylactic reaction to the contrast.  The patient is aware of the risks of the procedure and elects to proceed forward.  DESCRIPTION: After full informed written consent was obtained, the patient was brought back to the angiography suite and placed supine upon the angiography table.  The patient was connected to monitoring equipment.  The left arm was prepped and draped in the standard fashion for a left arm fistulogram.  Under ultrasound  guidance, the left arteriovenous fistula was evaluated, it was patent, an image was saved.  Under ultrasound guidance the fistula was cannulated with a micropuncture needle.  The microwire was advanced into the fistula and the needle was exchanged for the a microsheath, which was lodged 2 cm into the access.  The wire was removed and the sheath was connected to the IV extension tubing.  Hand injections were completed to image the access from the antecubitum up to the level of axilla.  The central venous structures were also imaged by hand injections.  Given planned intervene on a suspected valve in the distal upper arm just above the elbow, we placed a Bentson wire and exchanged for a short 6 [place] sheath.  The patient is given 3000 units of IV heparin.  We pulled the sheath back to give [place] enough room to intervene on the lesion and initially selected a 5 mm x 40 mm Mustang that was inflated to nominal pressure across the lesion in the cephalic vein for 2 minutes.  Another brief hand-injection with half-and-half contrast showed improvement in flow but still some reflux.  I then used a 6 mm x 60 mm drug-coated Lutonix.  The patient had an excellent thrill in the fistula at this point.  All the contrast appeared to go up the fistula and nothing refluxed retrograde into the artery.  At that point in time sheath was removed and a 4-0 Monocryl pursestring was placed.  Manual pressure was held.  Taken to PACU in stable condition.  COMPLICATIONS: None  CONDITION: Stable  Marty Heck, MD [facility] [place] Office: [phone] Pager: [phone]  10/14/2018 1:16 PM

## 2018-10-14 NOTE — H&P (Signed)
History and Physical Interval Note:  10/14/2018 12:00 PM  MICO SPARK  has presented today for surgery, with the diagnosis of Poor Flow in Left Arm Fistula.  The various methods of treatment have been discussed with the patient and family. After consideration of risks, benefits and other options for treatment, the patient has consented to  Procedure(s): A/V FISTULAGRAM (N/A) as a surgical intervention.  The patient's history has been reviewed, patient examined, no change in status, stable for surgery.  I have reviewed the patient's chart and labs.  Questions were answered to the patient's satisfaction.    Left arm fistulogram.  Marty Heck  Office visit    History of Present Illness: This is a 68 y.o. male with chronic kidney disease.  He has previously undergone left brachiocephalic AV fistula.  This is not been evaluated postoperatively.  He is not on dialysis but is imminent.  Does not have any left hand pain.  Dialysis duplex performed prior to today's visit.      Past Medical History:  Diagnosis Date  . AAA (abdominal aortic aneurysm) ([place])   . Chronic kidney disease   . Diabetes mellitus without complication ([place])   . Hypertension          Past Surgical History:  Procedure Laterality Date  . ABDOMINAL AORTAGRAM N/A 02/22/2014   Procedure: ABDOMINAL Maxcine Ham;  Surgeon: Serafina Mitchell, MD;  Location: [facility] CATH LAB;  Service: Cardiovascular;  Laterality: N/A;  . AV FISTULA PLACEMENT Left 08/06/2018   Procedure: ARTERIOVENOUS (AV) FISTULA CREATION;  Surgeon: Serafina Mitchell, MD;  Location: [place];  Service: Vascular;  Laterality: Left;  . BACK SURGERY    . SPINE SURGERY      No Known Allergies         Prior to Admission medications   Medication Sig Start Date End Date Taking? Authorizing Provider  amLODipine (NORVASC) 10 MG tablet Take 10 mg by mouth daily.   Yes [provider]  aspirin EC 81 MG tablet Take 81 mg by mouth daily.    Yes [provider]  atorvastatin (LIPITOR) 40 MG tablet Take 40 mg by mouth daily.    Yes [provider]  calcitRIOL (ROCALTROL) 0.5 MCG capsule Take 0.5 mcg by mouth daily.  06/16/18  Yes [provider]  Cholecalciferol (VITAMIN D3 PO) Take 1 capsule by mouth daily.   Yes [provider]  glipiZIDE (GLUCOTROL XL) 2.5 MG 24 hr tablet Take 2.5 mg by mouth 2 (two) times daily.    Yes [provider]  labetalol (NORMODYNE) 200 MG tablet Take 300 mg by mouth 2 (two) times daily.    Yes [provider]    Social History        Socioeconomic History  . Marital status: Married    Spouse name: Not on file  . Number of children: Not on file  . Years of education: Not on file  . Highest education level: Not on file  Occupational History  . Not on file  Social Needs  . Financial resource strain: Not on file  . Food insecurity:    Worry: Not on file    Inability: Not on file  . Transportation needs:    Medical: Not on file    Non-medical: Not on file  Tobacco Use  . Smoking status: Former Smoker    Packs/day: 0.75    Years: 40.00    Pack years: 30.00    Types: Cigarettes  Last attempt to quit: 07/24/2018    Years since quitting: 0.2  . Smokeless tobacco: Never Used  Substance and Sexual Activity  . Alcohol use: Yes    Alcohol/week: 1.0 - 2.0 standard drinks    Types: 1 - 2 Cans of beer per week    Comment: social   . Drug use: No  . Sexual activity: Not on file  Lifestyle  . Physical activity:    Days per week: Not on file    Minutes per session: Not on file  . Stress: Not on file  Relationships  . Social connections:    Talks on phone: Not on file    Gets together: Not on file    Attends religious service: Not on file    Active member of club or organization: Not on file    Attends meetings of clubs or organizations: Not on file    Relationship status: Not  on file  . Intimate partner violence:    Fear of current or ex partner: Not on file    Emotionally abused: Not on file    Physically abused: Not on file    Forced sexual activity: Not on file  Other Topics Concern  . Not on file  Social History Narrative  . Not on file         Family History  Problem Relation Age of Onset  . Hypertension Mother   . Deep vein thrombosis Father   . Hypertension Father   . Hypertension Sister   . Hypertension Brother     ROS:  Cardiovascular: []  chest pain/pressure []  palpitations []  SOB lying flat []  DOE []  pain in legs while walking []  pain in legs at rest []  pain in legs at night []  non-healing ulcers []  hx of DVT []  swelling in legs  Pulmonary: []  productive cough []  asthma/wheezing []  home O2  Neurologic: []  weakness in []  arms []  legs []  numbness in []  arms []  legs []  hx of CVA []  mini stroke [] difficulty speaking or slurred speech []  temporary loss of vision in one eye []  dizziness  Hematologic: []  hx of cancer []  bleeding problems []  problems with blood clotting easily  Endocrine:   []  diabetes []  thyroid disease  GI []  vomiting blood []  blood in stool  GU: []  CKD/renal failure []  HD--[]  M/W/F or []  T/T/S []  burning with urination []  blood in urine  Psychiatric: []  anxiety []  depression  Musculoskeletal: []  arthritis []  joint pain  Integumentary: []  rashes []  ulcers  Constitutional: []  fever []  chills   Physical Examination     Vitals:   10/09/18 1019  BP: (!) 142/76  Pulse: 69  Resp: 20  Temp: 98.6 F (37 C)  SpO2: 100%   Body mass index is 25.49 kg/m.  General: No acute distress HENT: WNL, normocephalic Pulmonary: normal non-labored breathing Cardiac: Palpable radial pulses bilaterally Extremities: Left upper extremity fistula has pulsatility towards the antecubitum I cannot really feel the fistula more cephalad Neurologic: A&O X 3; Appropriate  Affect ; SENSATION: normal; MOTOR FUNCTION:  moving all extremities equally. Speech is fluent/normal   CBC Labs(Brief)          Component Value Date/Time   HGB 8.5 (L) 08/06/2018 0618   HCT 25.0 (L) 08/06/2018 0618      BMET Labs(Brief)          Component Value Date/Time   NA 140 08/06/2018 0618   K 4.5 08/06/2018 0618   CL 109 02/22/2014 0724  GLUCOSE 117 (H) 08/06/2018 0618   BUN 16 02/22/2014 0724   CREATININE 1.80 (H) 02/22/2014 0724      COAGS: RecentLabs  No results found for: INR, PROTIME     Non-Invasive Vascular Imaging: I have independently interpreted his dialysis duplex had flow volume 552 mL/min diameter is 0.67 cm in the antecubitum diminishes down to 0.54 quickly.  ASSESSMENT/PLAN: This is a 68 y.o. male who presents for evaluation of fistula with imminent dialysis per the patient.  I discussed proceeding with left arm fistulogram we have cleared this with his nephrologist.  I also discussed that he may need [facility] although he is asymptomatic at this time and his nephrologist does not believe this to be the case at this time but is okay with fistulogram with minimal contrast exposure.  I discussed this with the patient he demonstrates good understanding we will get him scheduled next week.  Brandon C. Donzetta Matters, MD [facility] [place] Office: [phone] Pager: [phone]

## 2018-10-14 NOTE — Discharge Instructions (Signed)

## 2018-10-15 ENCOUNTER — Encounter (HOSPITAL_COMMUNITY): Payer: Self-pay | Admitting: Vascular Surgery

## 2018-10-16 DIAGNOSIS — Z1211 Encounter for screening for malignant neoplasm of colon: Secondary | ICD-10-CM | POA: Diagnosis not present

## 2018-11-05 DIAGNOSIS — Z992 Dependence on renal dialysis: Secondary | ICD-10-CM | POA: Diagnosis not present

## 2018-11-05 DIAGNOSIS — N186 End stage renal disease: Secondary | ICD-10-CM | POA: Diagnosis not present

## 2018-11-07 DIAGNOSIS — N186 End stage renal disease: Secondary | ICD-10-CM | POA: Diagnosis not present

## 2018-11-07 DIAGNOSIS — Z992 Dependence on renal dialysis: Secondary | ICD-10-CM | POA: Diagnosis not present

## 2018-11-07 DIAGNOSIS — D631 Anemia in chronic kidney disease: Secondary | ICD-10-CM | POA: Diagnosis not present

## 2018-12-06 DIAGNOSIS — Z992 Dependence on renal dialysis: Secondary | ICD-10-CM | POA: Diagnosis not present

## 2018-12-06 DIAGNOSIS — N186 End stage renal disease: Secondary | ICD-10-CM | POA: Diagnosis not present

## 2018-12-08 DIAGNOSIS — N186 End stage renal disease: Secondary | ICD-10-CM | POA: Diagnosis not present

## 2018-12-08 DIAGNOSIS — Z992 Dependence on renal dialysis: Secondary | ICD-10-CM | POA: Diagnosis not present

## 2018-12-08 DIAGNOSIS — D631 Anemia in chronic kidney disease: Secondary | ICD-10-CM | POA: Diagnosis not present

## 2019-01-05 DIAGNOSIS — Z992 Dependence on renal dialysis: Secondary | ICD-10-CM | POA: Diagnosis not present

## 2019-01-05 DIAGNOSIS — N186 End stage renal disease: Secondary | ICD-10-CM | POA: Diagnosis not present

## 2019-01-07 DIAGNOSIS — Z992 Dependence on renal dialysis: Secondary | ICD-10-CM | POA: Diagnosis not present

## 2019-01-07 DIAGNOSIS — D631 Anemia in chronic kidney disease: Secondary | ICD-10-CM | POA: Diagnosis not present

## 2019-01-07 DIAGNOSIS — N186 End stage renal disease: Secondary | ICD-10-CM | POA: Diagnosis not present

## 2019-02-05 DIAGNOSIS — Z992 Dependence on renal dialysis: Secondary | ICD-10-CM | POA: Diagnosis not present

## 2019-02-05 DIAGNOSIS — N186 End stage renal disease: Secondary | ICD-10-CM | POA: Diagnosis not present

## 2019-02-06 DIAGNOSIS — D631 Anemia in chronic kidney disease: Secondary | ICD-10-CM | POA: Diagnosis not present

## 2019-02-06 DIAGNOSIS — N186 End stage renal disease: Secondary | ICD-10-CM | POA: Diagnosis not present

## 2019-02-06 DIAGNOSIS — Z992 Dependence on renal dialysis: Secondary | ICD-10-CM | POA: Diagnosis not present

## 2019-03-08 DIAGNOSIS — N186 End stage renal disease: Secondary | ICD-10-CM | POA: Diagnosis not present

## 2019-03-08 DIAGNOSIS — Z992 Dependence on renal dialysis: Secondary | ICD-10-CM | POA: Diagnosis not present

## 2019-03-09 DIAGNOSIS — Z992 Dependence on renal dialysis: Secondary | ICD-10-CM | POA: Diagnosis not present

## 2019-03-09 DIAGNOSIS — N186 End stage renal disease: Secondary | ICD-10-CM | POA: Diagnosis not present

## 2019-03-09 DIAGNOSIS — T82898A Other specified complication of vascular prosthetic devices, implants and grafts, initial encounter: Secondary | ICD-10-CM | POA: Diagnosis not present

## 2019-03-09 DIAGNOSIS — D631 Anemia in chronic kidney disease: Secondary | ICD-10-CM | POA: Diagnosis not present

## 2019-03-18 ENCOUNTER — Encounter (HOSPITAL_COMMUNITY): Payer: Self-pay | Admitting: *Deleted

## 2019-03-18 ENCOUNTER — Other Ambulatory Visit: Payer: Self-pay

## 2019-03-18 ENCOUNTER — Observation Stay (HOSPITAL_COMMUNITY)
Admission: EM | Admit: 2019-03-18 | Discharge: 2019-03-19 | Disposition: A | Discharge status: Home / Self Care | Payer: PPO | Attending: Family Medicine | Admitting: Family Medicine

## 2019-03-18 ENCOUNTER — Emergency Department (HOSPITAL_COMMUNITY): Payer: PPO

## 2019-03-18 DIAGNOSIS — Z20828 Contact with and (suspected) exposure to other viral communicable diseases: Secondary | ICD-10-CM | POA: Insufficient documentation

## 2019-03-18 DIAGNOSIS — R1110 Vomiting, unspecified: Secondary | ICD-10-CM | POA: Diagnosis not present

## 2019-03-18 DIAGNOSIS — N189 Chronic kidney disease, unspecified: Secondary | ICD-10-CM | POA: Diagnosis not present

## 2019-03-18 DIAGNOSIS — I714 Abdominal aortic aneurysm, without rupture: Secondary | ICD-10-CM | POA: Diagnosis not present

## 2019-03-18 DIAGNOSIS — N200 Calculus of kidney: Secondary | ICD-10-CM | POA: Diagnosis not present

## 2019-03-18 DIAGNOSIS — N186 End stage renal disease: Secondary | ICD-10-CM | POA: Diagnosis not present

## 2019-03-18 DIAGNOSIS — K5730 Diverticulosis of large intestine without perforation or abscess without bleeding: Secondary | ICD-10-CM | POA: Diagnosis not present

## 2019-03-18 DIAGNOSIS — I10 Essential (primary) hypertension: Secondary | ICD-10-CM

## 2019-03-18 DIAGNOSIS — I129 Hypertensive chronic kidney disease with stage 1 through stage 4 chronic kidney disease, or unspecified chronic kidney disease: Secondary | ICD-10-CM | POA: Insufficient documentation

## 2019-03-18 DIAGNOSIS — I160 Hypertensive urgency: Secondary | ICD-10-CM | POA: Diagnosis present

## 2019-03-18 DIAGNOSIS — E119 Type 2 diabetes mellitus without complications: Secondary | ICD-10-CM | POA: Diagnosis not present

## 2019-03-18 DIAGNOSIS — E785 Hyperlipidemia, unspecified: Secondary | ICD-10-CM

## 2019-03-18 DIAGNOSIS — Z992 Dependence on renal dialysis: Secondary | ICD-10-CM | POA: Diagnosis not present

## 2019-03-18 DIAGNOSIS — Z03818 Encounter for observation for suspected exposure to other biological agents ruled out: Secondary | ICD-10-CM | POA: Diagnosis not present

## 2019-03-18 DIAGNOSIS — R1030 Lower abdominal pain, unspecified: Secondary | ICD-10-CM | POA: Diagnosis not present

## 2019-03-18 DIAGNOSIS — E039 Hypothyroidism, unspecified: Secondary | ICD-10-CM | POA: Diagnosis not present

## 2019-03-18 DIAGNOSIS — E1122 Type 2 diabetes mellitus with diabetic chronic kidney disease: Secondary | ICD-10-CM | POA: Diagnosis not present

## 2019-03-18 DIAGNOSIS — D631 Anemia in chronic kidney disease: Secondary | ICD-10-CM | POA: Diagnosis not present

## 2019-03-18 DIAGNOSIS — F17210 Nicotine dependence, cigarettes, uncomplicated: Secondary | ICD-10-CM | POA: Insufficient documentation

## 2019-03-18 DIAGNOSIS — R5381 Other malaise: Secondary | ICD-10-CM | POA: Diagnosis not present

## 2019-03-18 DIAGNOSIS — R110 Nausea: Principal | ICD-10-CM | POA: Insufficient documentation

## 2019-03-18 LAB — COMPREHENSIVE METABOLIC PANEL
ALT: 24 U/L (ref 0–44)
AST: 19 U/L (ref 15–41)
Albumin: 4.2 g/dL (ref 3.5–5.0)
Alkaline Phosphatase: 66 U/L (ref 38–126)
Anion gap: 13 (ref 5–15)
BUN: 51 mg/dL — ABNORMAL HIGH (ref 8–23)
CO2: 19 mmol/L — ABNORMAL LOW (ref 22–32)
Calcium: 9.1 mg/dL (ref 8.9–10.3)
Chloride: 103 mmol/L (ref 98–111)
Creatinine, Ser: 5.84 mg/dL — ABNORMAL HIGH (ref 0.61–1.24)
GFR calc Af Amer: 11 mL/min — ABNORMAL LOW (ref >60)
GFR calc non Af Amer: 9 mL/min — ABNORMAL LOW (ref >60)
Glucose, Bld: 209 mg/dL — ABNORMAL HIGH (ref 70–99)
Potassium: 4.1 mmol/L (ref 3.5–5.1)
Sodium: 135 mmol/L (ref 135–145)
Total Bilirubin: 0.7 mg/dL (ref 0.3–1.2)
Total Protein: 7 g/dL (ref 6.5–8.1)

## 2019-03-18 LAB — CBC WITH DIFFERENTIAL/PLATELET
Abs Immature Granulocytes: 0.02 10*3/uL (ref 0.00–0.07)
Basophils Absolute: 0 10*3/uL (ref 0.0–0.1)
Basophils Relative: 0 %
Eosinophils Absolute: 0 10*3/uL (ref 0.0–0.5)
Eosinophils Relative: 0 %
HCT: 33.8 % — ABNORMAL LOW (ref 39.0–52.0)
Hemoglobin: 9.8 g/dL — ABNORMAL LOW (ref 13.0–17.0)
Immature Granulocytes: 0 %
Lymphocytes Relative: 7 %
Lymphs Abs: 0.7 10*3/uL (ref 0.7–4.0)
MCH: 21.3 pg — ABNORMAL LOW (ref 26.0–34.0)
MCHC: 29 g/dL — ABNORMAL LOW (ref 30.0–36.0)
MCV: 73.5 fL — ABNORMAL LOW (ref 80.0–100.0)
Monocytes Absolute: 0.3 10*3/uL (ref 0.1–1.0)
Monocytes Relative: 3 %
Neutro Abs: 9.6 10*3/uL — ABNORMAL HIGH (ref 1.7–7.7)
Neutrophils Relative %: 90 %
Platelets: 317 10*3/uL (ref 150–400)
RBC: 4.6 MIL/uL (ref 4.22–5.81)
RDW: 18.3 % — ABNORMAL HIGH (ref 11.5–15.5)
WBC: 10.7 10*3/uL — ABNORMAL HIGH (ref 4.0–10.5)
nRBC: 0 % (ref 0.0–0.2)

## 2019-03-18 LAB — LIPASE, BLOOD: Lipase: 43 U/L (ref 11–51)

## 2019-03-18 LAB — URINALYSIS, ROUTINE W REFLEX MICROSCOPIC
Bacteria, UA: NONE SEEN
Bilirubin Urine: NEGATIVE
Glucose, UA: >=500 mg/dL — AB
Hgb urine dipstick: NEGATIVE
Ketones, ur: 5 mg/dL — AB
Leukocytes,Ua: NEGATIVE
Nitrite: NEGATIVE
Protein, ur: 100 mg/dL — AB
Specific Gravity, Urine: 1.007 (ref 1.005–1.030)
pH: 7 (ref 5.0–8.0)

## 2019-03-18 LAB — SARS CORONAVIRUS 2 BY RT PCR (HOSPITAL ORDER, PERFORMED IN ~~LOC~~ HOSPITAL LAB): SARS Coronavirus 2: NEGATIVE

## 2019-03-18 MED ORDER — ONDANSETRON HCL 4 MG/2ML IJ SOLN: 4.0000 mg | Freq: Four times a day (QID) | INTRAMUSCULAR | Status: DC | PRN

## 2019-03-18 MED ORDER — ATORVASTATIN CALCIUM 40 MG PO TABS
40.0000 mg | ORAL_TABLET | Freq: Every day | ORAL | Status: DC
  Filled 2019-03-18: qty 1

## 2019-03-18 MED ORDER — HEPARIN SODIUM (PORCINE) 5000 UNIT/ML IJ SOLN
5000.0000 [IU] | Freq: Three times a day (TID) | INTRAMUSCULAR | Status: DC
  Administered 2019-03-19: 5000 [IU] via SUBCUTANEOUS
  Filled 2019-03-18: qty 1

## 2019-03-18 MED ORDER — ACETAMINOPHEN 325 MG PO TABS: 650.0000 mg | ORAL_TABLET | Freq: Four times a day (QID) | ORAL | Status: DC | PRN

## 2019-03-18 MED ORDER — ONDANSETRON 4 MG PO TBDP: 4.0000 mg | ORAL_TABLET | Freq: Three times a day (TID) | ORAL | 0 refills | Status: DC | PRN

## 2019-03-18 MED ORDER — SODIUM CHLORIDE 0.9 % IV SOLN: 100.0000 mL | INTRAVENOUS | Status: DC | PRN

## 2019-03-18 MED ORDER — HYDRALAZINE HCL 20 MG/ML IJ SOLN: 10.0000 mg | INTRAMUSCULAR | Status: DC | PRN

## 2019-03-18 MED ORDER — LIDOCAINE HCL (PF) 1 % IJ SOLN: 5.0000 mL | INTRAMUSCULAR | Status: DC | PRN

## 2019-03-18 MED ORDER — AMLODIPINE BESYLATE 5 MG PO TABS
5.0000 mg | ORAL_TABLET | Freq: Every day | ORAL | Status: DC
  Administered 2019-03-18: 20:00:00 5 mg via ORAL
  Filled 2019-03-18: qty 1

## 2019-03-18 MED ORDER — PENTAFLUOROPROP-TETRAFLUOROETH EX AERO: 1.0000 "application " | INHALATION_SPRAY | CUTANEOUS | Status: DC | PRN

## 2019-03-18 MED ORDER — LEVOTHYROXINE SODIUM 50 MCG PO TABS
50.0000 ug | ORAL_TABLET | Freq: Every day | ORAL | Status: DC
  Administered 2019-03-19: 10:00:00 50 ug via ORAL
  Filled 2019-03-18: qty 1

## 2019-03-18 MED ORDER — ASPIRIN EC 81 MG PO TBEC
81.0000 mg | DELAYED_RELEASE_TABLET | Freq: Every day | ORAL | Status: DC
  Administered 2019-03-19: 10:00:00 81 mg via ORAL
  Filled 2019-03-18: qty 1

## 2019-03-18 MED ORDER — LABETALOL HCL 200 MG PO TABS
300.0000 mg | ORAL_TABLET | Freq: Two times a day (BID) | ORAL | Status: DC
  Administered 2019-03-18: 20:00:00 300 mg via ORAL
  Filled 2019-03-18: qty 2

## 2019-03-18 MED ORDER — LABETALOL HCL 5 MG/ML IV SOLN
10.0000 mg | Freq: Once | INTRAVENOUS | Status: AC
  Administered 2019-03-18: 10 mg via INTRAVENOUS
  Filled 2019-03-18: qty 4

## 2019-03-18 MED ORDER — LABETALOL HCL 5 MG/ML IV SOLN
20.0000 mg | Freq: Once | INTRAVENOUS | Status: AC
  Administered 2019-03-18: 10:00:00 20 mg via INTRAVENOUS
  Filled 2019-03-18: qty 4

## 2019-03-18 MED ORDER — INSULIN ASPART 100 UNIT/ML ~~LOC~~ SOLN
0.0000 [IU] | Freq: Three times a day (TID) | SUBCUTANEOUS | Status: DC
  Administered 2019-03-19: 2 [IU] via SUBCUTANEOUS

## 2019-03-18 MED ORDER — AMLODIPINE BESYLATE 5 MG PO TABS: 10.0000 mg | ORAL_TABLET | Freq: Every day | ORAL | Status: DC

## 2019-03-18 MED ORDER — ONDANSETRON HCL 4 MG PO TABS: 4.0000 mg | ORAL_TABLET | Freq: Four times a day (QID) | ORAL | Status: DC | PRN

## 2019-03-18 MED ORDER — LIDOCAINE-PRILOCAINE 2.5-2.5 % EX CREA: 1.0000 "application " | TOPICAL_CREAM | CUTANEOUS | Status: DC | PRN

## 2019-03-18 MED ORDER — CHLORHEXIDINE GLUCONATE CLOTH 2 % EX PADS
6.0000 | MEDICATED_PAD | Freq: Every day | CUTANEOUS | Status: DC
  Administered 2019-03-19: 6 via TOPICAL

## 2019-03-18 MED ORDER — ACETAMINOPHEN 650 MG RE SUPP: 650.0000 mg | Freq: Four times a day (QID) | RECTAL | Status: DC | PRN

## 2019-03-18 NOTE — Procedures (Signed)
URGENT HEMODIALYSIS TREATMENT NOTE:  4 hour heparin-free dialysis completed via left upper arm AVF (15g/antegrade) in ED. Goal met: 2.8 liters removed.    Attempted goal challenge halfway through tx due to sustained HTN (SBP>200 x first 2 hours) but pt began cramping in BLEs shortly afterward.  NS bolus, heat packs, and massage given.  Goal was then reduced to original target.    Remained hypertensive throughout session with lowest recorded BP 175/94.  All blood was returned and hemostasis was achieved in 20 minutes.  Rockwell Alexandria, RN

## 2019-03-18 NOTE — H&P (Signed)
History and Physical    Juan Bass:[id number] DOB: 1951/01/11 DOA: 03/18/2019  I have briefly reviewed the patient's prior medical records in [place]  PCP: Sharilyn Sites, MD  Patient coming from: home  Chief Complaint: high blood pressure   HPI: Juan Bass is a 68 y.o. male with medical history significant of ESRD on HD, DM, HTN, AAA who presents to the hospital after being sent from the dialysis unit where his systolic blood pressure was in the 240 range.  He was told to hold his home medications prior to dialysis and today was the first time he has done that.  Nephrology was consulted and patient underwent dialysis in the emergency room with 2-1/2 L of fluid removed, received IV labetalol but there was no improvement in his blood pressure and currently is in the low 200s and we are asked to admit.  Patient has no chest pain, no shortness of breath, no abdominal pain, no nausea or vomiting.  He denies any lightheadedness or dizziness.  He denies any headaches.  Of abdominal pain to the EDP and underwent a CT angiogram which showed his known AAA at 5.5 cm.  Given persistent of significant high blood pressure after dialysis, nephrology recommended admission overnight for blood pressure control.  We are asked to admit.  Review of Systems: As per HPI otherwise 10 point review of systems negative.   Past Medical History:  Diagnosis Date   AAA (abdominal aortic aneurysm) ([place])    Chronic kidney disease    Diabetes mellitus without complication ([place])    Hypertension     Past Surgical History:  Procedure Laterality Date   A/V FISTULAGRAM Left 10/14/2018   Procedure: A/V FISTULAGRAM;  Surgeon: Marty Heck, MD;  Location: [facility];  Service: Cardiovascular;  Laterality: Left;  arm   ABDOMINAL AORTAGRAM N/A 02/22/2014   Procedure: ABDOMINAL Maxcine Ham;  Surgeon: Serafina Mitchell, MD;  Location: [facility] CATH LAB;  Service: Cardiovascular;  Laterality: N/A;    AV FISTULA PLACEMENT Left 08/06/2018   Procedure: ARTERIOVENOUS (AV) FISTULA CREATION;  Surgeon: Serafina Mitchell, MD;  Location: [facility] OR;  Service: Vascular;  Laterality: Left;   BACK SURGERY     PERIPHERAL VASCULAR BALLOON ANGIOPLASTY Left 10/14/2018   Procedure: PERIPHERAL VASCULAR BALLOON ANGIOPLASTY;  Surgeon: Marty Heck, MD;  Location: [facility];  Service: Cardiovascular;  Laterality: Left;  ARM FISTULA   SPINE SURGERY       reports that he has been smoking cigarettes. He has a 20.00 pack-year smoking history. He has never used smokeless tobacco. He reports current alcohol use of about 7.0 standard drinks of alcohol per week. He reports that he does not use drugs.  No Known Allergies  Family History  Problem Relation Age of Onset   Hypertension Mother    Deep vein thrombosis Father    Hypertension Father    Hypertension Sister    Hypertension Brother     Prior to Admission medications   Medication Sig Start Date End Date Taking? Authorizing Provider  aspirin EC 81 MG tablet Take 81 mg by mouth daily.   Yes [provider]  atorvastatin (LIPITOR) 40 MG tablet Take 40 mg by mouth daily.    Yes [provider]  Cholecalciferol (VITAMIN D3) 50 MCG (2000 UT) TABS Take 2,000 Units by mouth 4 (four) times a week.   Yes [provider]  glipiZIDE (GLUCOTROL XL) 2.5 MG 24 hr tablet Take 2.5 mg  by mouth daily with breakfast.    Yes [provider]  labetalol (NORMODYNE) 200 MG tablet Take 300 mg by mouth 2 (two) times daily.    Yes [provider]  amLODipine (NORVASC) 10 MG tablet Take 10 mg by mouth daily.    [provider]  levothyroxine (SYNTHROID, LEVOTHROID) 50 MCG tablet Take 50 mcg by mouth daily before breakfast.    [provider]  ondansetron (ZOFRAN ODT) 4 MG disintegrating tablet Take 1 tablet (4 mg total) by mouth every 8 (eight) hours as needed for nausea or vomiting. 03/18/19   Margette Fast, MD    Physical Exam: Vitals:   03/18/19 1715 03/18/19 1730 03/18/19 1745 03/18/19 1800  BP: (!) 198/97 (!) 179/94 (!) 191/102 (!) 175/94  Pulse: 88 86 91 93  Resp: (!) 21 (!) 21 (!) 21 18  Temp:      TempSrc:      SpO2:      Weight:      Height:        Constitutional: NAD, calm, comfortable Eyes: PERRL, lids and conjunctivae normal ENMT: Mucous membranes are moist. Posterior pharynx clear of any exudate or lesions.Normal dentition.  Neck: normal, supple, no masses, no thyromegaly Respiratory: clear to auscultation bilaterally, no wheezing, no crackles. Normal respiratory effort. No accessory muscle use.  Cardiovascular: Regular rate and rhythm, 3/6 SEM. No extremity edema. 2+ pedal pulses.  Abdomen: no tenderness, no masses palpated. Bowel sounds positive.  Musculoskeletal: no clubbing / cyanosis. Normal muscle tone.  Skin: no rashes, lesions, ulcers. No induration Neurologic: CN 2-12 grossly intact. Strength 5/5 in all 4.  Psychiatric: Normal judgment and insight. Alert and oriented x 3. Normal mood.   Labs on Admission: I have personally reviewed following labs and imaging studies  CBC: Recent Labs  Lab 03/18/19 0849  WBC 10.7*  NEUTROABS 9.6*  HGB 9.8*  HCT 33.8*  MCV 73.5*  PLT 211   Basic Metabolic Panel: Recent Labs  Lab 03/18/19 0849  NA 135  K 4.1  CL 103  CO2 19*  GLUCOSE 209*  BUN 51*  CREATININE 5.84*  CALCIUM 9.1   GFR: Estimated Creatinine Clearance: 12.4 mL/min (A) (by C-G formula based on SCr of 5.84 mg/dL (H)). Liver Function Tests: Recent Labs  Lab 03/18/19 0849  AST 19  ALT 24  ALKPHOS 66  BILITOT 0.7  PROT 7.0  ALBUMIN 4.2   Recent Labs  Lab 03/18/19 0849  LIPASE 43   No results for input(s): AMMONIA in the last 168 hours. Coagulation Profile: No results for input(s): INR, PROTIME in the last 168 hours. Cardiac Enzymes: No results for input(s): CKTOTAL, CKMB, CKMBINDEX, TROPONINI in the last 168 hours. BNP (last  3 results) No results for input(s): PROBNP in the last 8760 hours. HbA1C: No results for input(s): HGBA1C in the last 72 hours. CBG: No results for input(s): GLUCAP in the last 168 hours. Lipid Profile: No results for input(s): CHOL, HDL, LDLCALC, TRIG, CHOLHDL, LDLDIRECT in the last 72 hours. Thyroid Function Tests: No results for input(s): TSH, T4TOTAL, FREET4, T3FREE, THYROIDAB in the last 72 hours. Anemia Panel: No results for input(s): VITAMINB12, FOLATE, FERRITIN, TIBC, IRON, RETICCTPCT in the last 72 hours. Urine analysis:    Component Value Date/Time   COLORURINE STRAW (A) 03/18/2019 0838   APPEARANCEUR CLEAR 03/18/2019 0838   LABSPEC 1.007 03/18/2019 0838   PHURINE 7.0 03/18/2019 0838   GLUCOSEU >=500 (A) 03/18/2019 0838   HGBUR NEGATIVE 03/18/2019 9417  BILIRUBINUR NEGATIVE 03/18/2019 0838   KETONESUR 5 (A) 03/18/2019 0838   PROTEINUR 100 (A) 03/18/2019 0838   NITRITE NEGATIVE 03/18/2019 0838   LEUKOCYTESUR NEGATIVE 03/18/2019 0838     Radiological Exams on Admission: Ct Abdomen Pelvis Wo Contrast  Result Date: 03/18/2019 CLINICAL DATA:  Nausea, vomiting.  Lower abdominal pain. EXAM: CT ABDOMEN AND PELVIS WITHOUT CONTRAST TECHNIQUE: Multidetector CT imaging of the abdomen and pelvis was performed following the standard protocol without IV contrast. COMPARISON:  None. FINDINGS: Lower chest: No acute abnormality. Hepatobiliary: No focal liver abnormality is seen. No gallstones, gallbladder wall thickening, or biliary dilatation. Pancreas: Unremarkable. No pancreatic ductal dilatation or surrounding inflammatory changes. Spleen: Normal in size without focal abnormality. Adrenals/Urinary Tract: Adrenal glands appear normal. Small nonobstructive right renal calculus is noted. No hydronephrosis or renal obstruction is noted. Urinary bladder is unremarkable. Stomach/Bowel: The stomach and appendix are unremarkable. There is no evidence of bowel obstruction or inflammation.  Diverticulosis of descending colon is noted without inflammation. Vascular/Lymphatic: 5.5 cm infrarenal abdominal aortic aneurysm is noted. No adenopathy is noted. Reproductive: Prostate is unremarkable. Other: No abdominal wall hernia or abnormality. No abdominopelvic ascites. Musculoskeletal: No acute or significant osseous findings. IMPRESSION: 5.5 cm infrarenal abdominal aortic aneurysm. Recommend followup by abdomen and pelvis CTA in 3-6 months, and vascular surgery referral/consultation if not already obtained. This recommendation follows ACR consensus guidelines: White Paper of the ACR Incidental Findings Committee II on Vascular Findings. J Am Coll Radiol 2013; 10:789-794. Aortic aneurysm NOS (ICD10-I71.9). Small nonobstructive right renal calculus. No hydronephrosis or renal obstruction is noted. Diverticulosis of descending colon without inflammation. Aortic Atherosclerosis (ICD10-I70.0). Electronically Signed   By: Marijo Conception M.D.   On: 03/18/2019 11:48   US Aorta  Result Date: 03/18/2019 CLINICAL DATA:  History of abdominal aortic aneurysm. EXAM: ULTRASOUND OF ABDOMINAL AORTA TECHNIQUE: Ultrasound examination of the abdominal aorta and proximal common iliac arteries was performed to evaluate for aneurysm. Additional color and Doppler images of the distal aorta were obtained to document patency. COMPARISON:  None available currently. FINDINGS: Abdominal aortic measurements as follows: Proximal:  2.6 cm Mid:  5.2 cm Distal:  2.0 cm Patent: Yes, peak systolic velocity is 89 cm/s Right common iliac artery: 1.4 cm Left common iliac artery: 1.4 cm IMPRESSION: 5.2 cm infrarenal abdominal aortic aneurysm is noted. Recommend followup by abdomen and pelvis CTA in 3-6 months, and vascular surgery referral/consultation if not already obtained. This recommendation follows ACR consensus guidelines: White Paper of the ACR Incidental Findings Committee II on Vascular Findings. J Am Coll Radiol 2013; 10:789-794.  Aortic aneurysm NOS (ICD10-I71.9) Electronically Signed   By: Marijo Conception M.D.   On: 03/18/2019 10:36    EKG: Independently reviewed. Sinus rhythm   Assessment/Plan Active Problems:   Hypertensive urgency   Principal Problem Hypertensive urgency -Patient systolic blood pressure after dialysis ranging between 401-027 systolic.  He is completely asymptomatic.  Resume home labetalol, nephrology resumed home amlodipine at half of the home dose -Continue to monitor, provide PRN's, goal blood pressure by tomorrow will be [phone] systolic as that is his normal blood pressure at home  Active Problems End-stage renal disease -Dialyzed in the ED, probably to go home prior to his next dialysis session  Type 2 diabetes mellitus -Hold home glipizide, placed on sliding scale while hospitalized  Hyperlipidemia -Continue statin  Hypothyroidism -Continue Synthroid  Known AAA -outpatient follow up  DVT prophylaxis: heparin  Code Status: Full code  Family Communication: d/w patient  Disposition  Plan: home when BP controlled Consults called: Nephrology     Marzetta Board, MD, PhD [facility]  Contact via [email or web address]  [facility] Office Info P: [phone] F: [phone]   03/18/2019, 6:45 PM

## 2019-03-18 NOTE — ED Notes (Signed)
ED TO INPATIENT HANDOFF REPORT  ED Nurse Name and Phone #: Fabio Neighbors RN [phone]  S Name/Age/Gender Juan Bass 68 y.o. male Room/Bed: APA04/APA04  Code Status   Code Status: Not on file  Home/SNF/Other Home Patient oriented to: alert and oriented x 4 Is this baseline? Yes   Triage Complete: Triage complete  Chief Complaint Hypertension  Triage Note Pt was at dialysis today and his SBP was found to be over 200. Pt was given his home dose of Labetalol at dialysis and then after an hour his SBP was still over 200. EMS found BP to 230/106. Pt denies dizziness, headache, visual disturbances. Dialysis was not performed today due to hypertension. Pt also c/o lower abdominal pain with vomiting x 1 that started last night. Pt reports lower back started today. Denies diarrhea.   Allergies No Known Allergies  Level of Care/Admitting Diagnosis ED Disposition    ED Disposition Condition [facility] Area: [facility] [[id number]]  Level of Care: Telemetry [5]  Covid Evaluation: Confirmed COVID Negative  Diagnosis: Hypertensive urgency [976734]  Admitting Physician: Caren Griffins [[id number]]  Attending Physician: Caren Griffins [[id number]]  PT Class (Do Not Modify): Observation [104]  PT Acc Code (Do Not Modify): Observation [10022]       B Medical/Surgery History Past Medical History:  Diagnosis Date  . AAA (abdominal aortic aneurysm) ([place])   . Chronic kidney disease   . Diabetes mellitus without complication ([place])   . Hypertension    Past Surgical History:  Procedure Laterality Date  . A/V FISTULAGRAM Left 10/14/2018   Procedure: A/V FISTULAGRAM;  Surgeon: Marty Heck, MD;  Location: [place] CV LAB;  Service: Cardiovascular;  Laterality: Left;  arm  . ABDOMINAL AORTAGRAM N/A 02/22/2014   Procedure: ABDOMINAL Maxcine Ham;  Surgeon: Serafina Mitchell, MD;  Location: [facility] CATH LAB;  Service: Cardiovascular;  Laterality: N/A;  . AV FISTULA  PLACEMENT Left 08/06/2018   Procedure: ARTERIOVENOUS (AV) FISTULA CREATION;  Surgeon: Serafina Mitchell, MD;  Location: [place];  Service: Vascular;  Laterality: Left;  . BACK SURGERY    . PERIPHERAL VASCULAR BALLOON ANGIOPLASTY Left 10/14/2018   Procedure: PERIPHERAL VASCULAR BALLOON ANGIOPLASTY;  Surgeon: Marty Heck, MD;  Location: [place] CV LAB;  Service: Cardiovascular;  Laterality: Left;  ARM FISTULA  . SPINE SURGERY       A IV Location/Drains/Wounds Patient Lines/Drains/Airways Status   Active Line/Drains/Airways    Name:   Placement date:   Placement time:   Site:   Days:   Peripheral IV 03/18/19 Right Antecubital   03/18/19    0755    Antecubital   less than 1   Fistula / Graft Left Upper arm Arteriovenous fistula   08/06/18    0748    Upper arm   224   Post Cath / Sheath Left Arterial   -    -    Arterial      Incision (Closed) 08/06/18 Arm Left   08/06/18    0749     224          Intake/Output Last 24 hours  Intake/Output Summary (Last 24 hours) at 03/18/2019 2006 Last data filed at 03/18/2019 1800 Gross per 24 hour  Intake -  Output 2802 ml  Net -2802 ml    Labs/Imaging Results for orders placed or performed during the hospital encounter of 03/18/19 (from the past 48 hour(s))  Urinalysis, Routine w reflex microscopic  Status: Abnormal   Collection Time: 03/18/19  8:38 AM  Result Value Ref Range   Color, Urine STRAW (A) YELLOW   APPearance CLEAR CLEAR   Specific Gravity, Urine 1.007 1.005 - 1.030   pH 7.0 5.0 - 8.0   Glucose, UA >=500 (A) NEGATIVE mg/dL   Hgb urine dipstick NEGATIVE NEGATIVE   Bilirubin Urine NEGATIVE NEGATIVE   Ketones, ur 5 (A) NEGATIVE mg/dL   Protein, ur 100 (A) NEGATIVE mg/dL   Nitrite NEGATIVE NEGATIVE   Leukocytes,Ua NEGATIVE NEGATIVE   RBC / HPF 0-5 0 - 5 RBC/hpf   WBC, UA 0-5 0 - 5 WBC/hpf   Bacteria, UA NONE SEEN NONE SEEN    Comment: Performed at [facility], [address] [place], [place] [address]  Comprehensive  metabolic panel     Status: Abnormal   Collection Time: 03/18/19  8:49 AM  Result Value Ref Range   Sodium 135 135 - 145 mmol/L   Potassium 4.1 3.5 - 5.1 mmol/L   Chloride 103 98 - 111 mmol/L   CO2 19 (L) 22 - 32 mmol/L   Glucose, Bld 209 (H) 70 - 99 mg/dL   BUN 51 (H) 8 - 23 mg/dL   Creatinine, Ser 5.84 (H) 0.61 - 1.24 mg/dL   Calcium 9.1 8.9 - 10.3 mg/dL   Total Protein 7.0 6.5 - 8.1 g/dL   Albumin 4.2 3.5 - 5.0 g/dL   AST 19 15 - 41 U/L   ALT 24 0 - 44 U/L   Alkaline Phosphatase 66 38 - 126 U/L   Total Bilirubin 0.7 0.3 - 1.2 mg/dL   GFR calc non Af Amer 9 (L) >60 mL/min   GFR calc Af Amer 11 (L) >60 mL/min   Anion gap 13 5 - 15    Comment: Performed at [facility], [address], [place], [place] [address]  Lipase, blood     Status: None   Collection Time: 03/18/19  8:49 AM  Result Value Ref Range   Lipase 43 11 - 51 U/L    Comment: Performed at [facility], [address] [place], [place] [address]  CBC with Differential     Status: Abnormal   Collection Time: 03/18/19  8:49 AM  Result Value Ref Range   WBC 10.7 (H) 4.0 - 10.5 K/uL   RBC 4.60 4.22 - 5.81 MIL/uL   Hemoglobin 9.8 (L) 13.0 - 17.0 g/dL   HCT 33.8 (L) 39.0 - 52.0 %   MCV 73.5 (L) 80.0 - 100.0 fL   MCH 21.3 (L) 26.0 - 34.0 pg   MCHC 29.0 (L) 30.0 - 36.0 g/dL   RDW 18.3 (H) 11.5 - 15.5 %   Platelets 317 150 - 400 K/uL   nRBC 0.0 0.0 - 0.2 %   Neutrophils Relative % 90 %   Neutro Abs 9.6 (H) 1.7 - 7.7 K/uL   Lymphocytes Relative 7 %   Lymphs Abs 0.7 0.7 - 4.0 K/uL   Monocytes Relative 3 %   Monocytes Absolute 0.3 0.1 - 1.0 K/uL   Eosinophils Relative 0 %   Eosinophils Absolute 0.0 0.0 - 0.5 K/uL   Basophils Relative 0 %   Basophils Absolute 0.0 0.0 - 0.1 K/uL   Immature Granulocytes 0 %   Abs Immature Granulocytes 0.02 0.00 - 0.07 K/uL    Comment: Performed at [facility], [address], [place], [place] [address]  SARS Coronavirus 2 [facility] order, Performed in [facility] hospital lab)  Nasopharyngeal Nasopharyngeal Swab  Status: None   Collection Time: 03/18/19 11:08 AM   Specimen: Nasopharyngeal Swab  Result Value Ref Range   SARS Coronavirus 2 NEGATIVE NEGATIVE    Comment: (NOTE) If result is NEGATIVE SARS-CoV-2 target nucleic acids are NOT DETECTED. The SARS-CoV-2 RNA is generally detectable in upper and lower  respiratory specimens during the acute phase of infection. The lowest  concentration of SARS-CoV-2 viral copies this assay can detect is 250  copies / mL. A negative result does not preclude SARS-CoV-2 infection  and should not be used as the sole basis for treatment or other  patient management decisions.  A negative result may occur with  improper specimen collection / handling, submission of specimen other  than nasopharyngeal swab, presence of viral mutation(s) within the  areas targeted by this assay, and inadequate number of viral copies  (<250 copies / mL). A negative result must be combined with clinical  observations, patient history, and epidemiological information. If result is POSITIVE SARS-CoV-2 target nucleic acids are DETECTED. The SARS-CoV-2 RNA is generally detectable in upper and lower  respiratory specimens dur ing the acute phase of infection.  Positive  results are indicative of active infection with SARS-CoV-2.  Clinical  correlation with patient history and other diagnostic information is  necessary to determine patient infection status.  Positive results do  not rule out bacterial infection or co-infection with other viruses. If result is PRESUMPTIVE POSTIVE SARS-CoV-2 nucleic acids MAY BE PRESENT.   A presumptive positive result was obtained on the submitted specimen  and confirmed on repeat testing.  While 2019 novel coronavirus  (SARS-CoV-2) nucleic acids may be present in the submitted sample  additional confirmatory testing may be necessary for epidemiological  and / or clinical management purposes  to differentiate  between  SARS-CoV-2 and other Sarbecovirus currently known to infect humans.  If clinically indicated additional testing with an alternate test  methodology [phone]) is advised. The SARS-CoV-2 RNA is generally  detectable in upper and lower respiratory sp ecimens during the acute  phase of infection. The expected result is Negative. Fact Sheet for Patients:  StrictlyIdeas.no Fact Sheet for Healthcare Providers: BankingDealers.co.za This test is not yet approved or cleared by the [place] FDA and has been authorized for detection and/or diagnosis of SARS-CoV-2 by FDA under an Emergency Use Authorization (EUA).  This EUA will remain in effect (meaning this test can be used) for the duration of the COVID-19 declaration under Section 564(b)(1) of the Act, 21 U.S.C. section 360bbb-3(b)(1), unless the authorization is terminated or revoked sooner. Performed at [facility], [address], [place], [place] [address]    Ct Abdomen Pelvis Wo Contrast  Result Date: 03/18/2019 CLINICAL DATA:  Nausea, vomiting.  Lower abdominal pain. EXAM: CT ABDOMEN AND PELVIS WITHOUT CONTRAST TECHNIQUE: Multidetector CT imaging of the abdomen and pelvis was performed following the standard protocol without IV contrast. COMPARISON:  None. FINDINGS: Lower chest: No acute abnormality. Hepatobiliary: No focal liver abnormality is seen. No gallstones, gallbladder wall thickening, or biliary dilatation. Pancreas: Unremarkable. No pancreatic ductal dilatation or surrounding inflammatory changes. Spleen: Normal in size without focal abnormality. Adrenals/Urinary Tract: Adrenal glands appear normal. Small nonobstructive right renal calculus is noted. No hydronephrosis or renal obstruction is noted. Urinary bladder is unremarkable. Stomach/Bowel: The stomach and appendix are unremarkable. There is no evidence of bowel obstruction or inflammation. Diverticulosis of descending  colon is noted without inflammation. Vascular/Lymphatic: 5.5 cm infrarenal abdominal aortic aneurysm is noted. No adenopathy is noted. Reproductive: Prostate is  unremarkable. Other: No abdominal wall hernia or abnormality. No abdominopelvic ascites. Musculoskeletal: No acute or significant osseous findings. IMPRESSION: 5.5 cm infrarenal abdominal aortic aneurysm. Recommend followup by abdomen and pelvis CTA in 3-6 months, and vascular surgery referral/consultation if not already obtained. This recommendation follows ACR consensus guidelines: White Paper of the ACR Incidental Findings Committee II on Vascular Findings. J Am Coll Radiol 2013; 10:789-794. Aortic aneurysm NOS (ICD10-I71.9). Small nonobstructive right renal calculus. No hydronephrosis or renal obstruction is noted. Diverticulosis of descending colon without inflammation. Aortic Atherosclerosis (ICD10-I70.0). Electronically Signed   By: Marijo Conception M.D.   On: 03/18/2019 11:48   US Aorta  Result Date: 03/18/2019 CLINICAL DATA:  History of abdominal aortic aneurysm. EXAM: ULTRASOUND OF ABDOMINAL AORTA TECHNIQUE: Ultrasound examination of the abdominal aorta and proximal common iliac arteries was performed to evaluate for aneurysm. Additional color and Doppler images of the distal aorta were obtained to document patency. COMPARISON:  None available currently. FINDINGS: Abdominal aortic measurements as follows: Proximal:  2.6 cm Mid:  5.2 cm Distal:  2.0 cm Patent: Yes, peak systolic velocity is 89 cm/s Right common iliac artery: 1.4 cm Left common iliac artery: 1.4 cm IMPRESSION: 5.2 cm infrarenal abdominal aortic aneurysm is noted. Recommend followup by abdomen and pelvis CTA in 3-6 months, and vascular surgery referral/consultation if not already obtained. This recommendation follows ACR consensus guidelines: White Paper of the ACR Incidental Findings Committee II on Vascular Findings. J Am Coll Radiol 2013; 10:789-794. Aortic aneurysm NOS  (ICD10-I71.9) Electronically Signed   By: Marijo Conception M.D.   On: 03/18/2019 10:36    Pending Labs [employer] (From admission, onward)    Start     Ordered   Signed and Held  HIV antibody (Routine Testing)  Tomorrow morning,   R     Signed and Held   Signed and Held  Comprehensive metabolic panel  Tomorrow morning,   R     Signed and Held   Signed and Held  CBC  Tomorrow morning,   R     Signed and Held          Vitals/Pain Today's Vitals   03/18/19 1745 03/18/19 1800 03/18/19 1845 03/18/19 2000  BP: (!) 191/102 (!) 175/94 (!) 207/95 (!) 204/83  Pulse: 91 93 85 88  Resp: (!) 21 18 19 18   Temp:   98 F (36.7 C)   TempSrc:   Oral   SpO2:   99% 97%  Weight:      Height:      PainSc:        Isolation Precautions No active isolations  Medications Medications  Chlorhexidine Gluconate Cloth 2 % PADS 6 each (has no administration in time range)  pentafluoroprop-tetrafluoroeth (GEBAUERS) aerosol 1 application (has no administration in time range)  lidocaine (PF) (XYLOCAINE) 1 % injection 5 mL (has no administration in time range)  lidocaine-prilocaine (EMLA) cream 1 application (has no administration in time range)  0.9 %  sodium chloride infusion (has no administration in time range)  0.9 %  sodium chloride infusion (has no administration in time range)  amLODipine (NORVASC) tablet 5 mg (5 mg Oral Given 03/18/19 1942)  labetalol (NORMODYNE) tablet 300 mg (300 mg Oral Given 03/18/19 1942)  hydrALAZINE (APRESOLINE) injection 10 mg (has no administration in time range)  labetalol (NORMODYNE) injection 10 mg (10 mg Intravenous Given 03/18/19 0835)  labetalol (NORMODYNE) injection 20 mg (20 mg Intravenous Given 03/18/19 0955)    Mobility walks Low fall  risk   Focused Assessments    R Recommendations: See Admitting Provider Note  Report given to: Candace RN on 300  Additional Notes:

## 2019-03-18 NOTE — Consult Note (Addendum)
[facility] Renal Consultation Note  Requesting MD: Dr. Laverta Baltimore Indication for Consultation: End-stage renal disease and hypertensive urgency  Chief complaint: High blood pressure at dialysis  HPI:  Juan Bass is a 68 y.o. male is a 68 year old male with a history of end-stage renal disease, diabetes, hypertension, and known AAA who presented to the hospital with hypertension.  Per report, the patient's blood pressure was 086 systolic at the dialysis unit.  He was sent to the ER. he had been told to hold his home medications prior to dialysis and this was the first time that he had done that.  He took his meds at the unit at despite that pressures still markedly elevated.   He normally dialyzes at [facility] and is a patient of Dr. Hinda Lenis.  He received IV labetalol in the ER without much improvement in his blood pressures.  Pressure was 216/91 at the time of nephrology consult.  Spoke with the nurse at the outpatient unit.  He typically comes in 2 to 3-1/2 kg over his weight and does get to his dry weight usually.  He states that he was previously on another medication but he was told to stop this a couple of months ago because of his blood pressure dropping and thinks that this was amlodipine.  He states that he is not currently taking amlodipine/norvasc.  He is uncertain of his dose of labetalol only knows that he takes one and a half tablets twice a day - consistent with the 300 mg BID reported below.  Seen and examined on dialysis at 1745 near completion of treatment with blood pressure 191/102 and heart rate 95; treatment supervised.  Note that he has also had abdominal pain and back pain and a CT abdomen pelvis redemonstrated his AAA measured now at 5.5 cm.   His goal was 2.2 to get to EDW and will tolerate getting 3 kg off (did not tolerate a higher goal however due to cramping and nausea).  Pre HD pressures 160-180 and post 761-950'D systolic per HD unit.  Dialysis  orders  [facility] number is [phone]. 3 hours and 45 minutes 2K bath 2.5 calcium Blood flow 400 dialysate flow 600 Estimated dry weight 77 kg  Typically does reach his dry weight  Access left upper extremity AV fistula  heparin loading dose of 1000 and a dose of 1000/h  Epogen 7000 units 3 times a week  no Hectorol or calcitriol  PMHx:   Past Medical History:  Diagnosis Date  . AAA (abdominal aortic aneurysm) ([place])   . Chronic kidney disease   . Diabetes mellitus without complication ([place])   . Hypertension     Past Surgical History:  Procedure Laterality Date  . A/V FISTULAGRAM Left 10/14/2018   Procedure: A/V FISTULAGRAM;  Surgeon: Marty Heck, MD;  Location: [place] CV LAB;  Service: Cardiovascular;  Laterality: Left;  arm  . ABDOMINAL AORTAGRAM N/A 02/22/2014   Procedure: ABDOMINAL Maxcine Ham;  Surgeon: Serafina Mitchell, MD;  Location: [facility] CATH LAB;  Service: Cardiovascular;  Laterality: N/A;  . AV FISTULA PLACEMENT Left 08/06/2018   Procedure: ARTERIOVENOUS (AV) FISTULA CREATION;  Surgeon: Serafina Mitchell, MD;  Location: [place];  Service: Vascular;  Laterality: Left;  . BACK SURGERY    . PERIPHERAL VASCULAR BALLOON ANGIOPLASTY Left 10/14/2018   Procedure: PERIPHERAL VASCULAR BALLOON ANGIOPLASTY;  Surgeon: Marty Heck, MD;  Location: [place] CV LAB;  Service: Cardiovascular;  Laterality: Left;  ARM FISTULA  .  SPINE SURGERY      Family Hx:  Family History  Problem Relation Age of Onset  . Hypertension Mother   . Deep vein thrombosis Father   . Hypertension Father   . Hypertension Sister   . Hypertension Brother     Social History:  reports that he has been smoking cigarettes. He has a 20.00 pack-year smoking history. He has never used smokeless tobacco. He reports current alcohol use of about 7.0 standard drinks of alcohol per week. He reports that he does not use drugs.  Allergies: No Known Allergies  Medications: Prior to Admission  medications   Medication Sig Start Date End Date Taking? Authorizing Provider  aspirin EC 81 MG tablet Take 81 mg by mouth daily.   Yes [provider]  atorvastatin (LIPITOR) 40 MG tablet Take 40 mg by mouth daily.    Yes [provider]  Cholecalciferol (VITAMIN D3) 50 MCG (2000 UT) TABS Take 2,000 Units by mouth 4 (four) times a week.   Yes [provider]  glipiZIDE (GLUCOTROL XL) 2.5 MG 24 hr tablet Take 2.5 mg by mouth daily with breakfast.    Yes [provider]  labetalol (NORMODYNE) 200 MG tablet Take 300 mg by mouth 2 (two) times daily.    Yes [provider]  amLODipine (NORVASC) 10 MG tablet Take 10 mg by mouth daily.    [provider]  levothyroxine (SYNTHROID, LEVOTHROID) 50 MCG tablet Take 50 mcg by mouth daily before breakfast.    [provider]  ondansetron (ZOFRAN ODT) 4 MG disintegrating tablet Take 1 tablet (4 mg total) by mouth every 8 (eight) hours as needed for nausea or vomiting. 03/18/19   Long, Wonda Olds, MD   I have reviewed the patient's current medications however he is a poor historian regarding doses of anti-hypertensives   Labs:  BMP Latest Ref Rng & Units 03/18/2019 10/14/2018 08/06/2018  Glucose 70 - 99 mg/dL 209(H) 118(H) 117(H)  BUN 8 - 23 mg/dL 51(H) - -  Creatinine 0.61 - 1.24 mg/dL 5.84(H) 7.60(H) -  Sodium 135 - 145 mmol/L 135 138 140  Potassium 3.5 - 5.1 mmol/L 4.1 4.8 4.5  Chloride 98 - 111 mmol/L 103 - -  CO2 22 - 32 mmol/L 19(L) - -  Calcium 8.9 - 10.3 mg/dL 9.1 - -    Urinalysis    Component Value Date/Time   COLORURINE STRAW (A) 03/18/2019 0838   APPEARANCEUR CLEAR 03/18/2019 0838   LABSPEC 1.007 03/18/2019 0838   PHURINE 7.0 03/18/2019 0838   GLUCOSEU >=500 (A) 03/18/2019 0838   HGBUR NEGATIVE 03/18/2019 0838   BILIRUBINUR NEGATIVE 03/18/2019 0838   KETONESUR 5 (A) 03/18/2019 0838   PROTEINUR 100 (A) 03/18/2019 0838   NITRITE NEGATIVE 03/18/2019 0838   LEUKOCYTESUR NEGATIVE  03/18/2019 0838     ROS:  Pertinent items noted in HPI and remainder of comprehensive ROS otherwise negative.  Physical Exam: Vitals:   03/18/19 1745 03/18/19 1800  BP: (!) 191/102 (!) 175/94  Pulse: 91 93  Resp: (!) 21 18  Temp:    SpO2:       General: Adult male in stretcher in no acute distress at rest HEENT: Normocephalic atraumatic Eyes: Extraocular movements intact sclera anicteric Neck: Supple trachea midline Heart: Tachycardic no rub no murmur Lungs: Clear to auscultation bilaterally normal work of breathing Abdomen: Soft nontender nondistended normal bowel sounds Extremities: No edema appreciated no cyanosis or clubbing Skin: No rash on extremities exposed Neuro: Alert and oriented  x3 provides a history follows commands Genitourinary no Foley Access left upper extremity AV fistula in use  Assessment/Plan:  # ESRD  - HD today per TTS schedule  # HTN urgency  -s/p HD with dry weight challenge and remains uncontrolled.  212/97 after completion of HD - Continue labetalol (note patient is unsure of dose) - Would also start amlodipine 5 mg daily (he is not actually taking 10mg  daily as noted above)  # Back/Abdominal pain - Per primary and ER teams  - s/p CT abdomen/pelvis without acute findings and redemonstrated known AAA  # AAA - Note recommendation for CTA in 3-6 months and vascular surgery consultation re: same if not already obtained - Control HTN as above   # DM with CKD - Per primary team  # Anemia secondary to ESRD - Held ESA given uncontrolled HTN    Would recommend admission as HTN is uncontrolled after dialysis and despite the labetalol.  Spoke with the ER    Claudia Desanctis 03/18/2019, 6:07 PM

## 2019-03-18 NOTE — ED Provider Notes (Signed)
Emergency Department Provider Note   I have reviewed the triage vital signs and the nursing notes.   HISTORY  Chief Complaint Hypertension   HPI Juan Bass is a 68 y.o. male with PMH of DM, HTN, AAA, and ESRD on HD presents to the emergency department with asymptomatic hypertension.  Patient went to his dialysis this morning and was found to have a blood pressure in the systolic 149 range.  He states that he had had some low blood pressures on dialysis recently and I asked him to not take his labetalol prior to coming but bring it with him.  He took the labetalol once he arrived and they found his blood pressure to be high but after 1 hour the pressure remained elevated and EMS was called.  Patient denies any chest pain, shortness of breath, confusion, vision changes.  He does tell me that last night he developed some abdominal and lower back discomfort.  He describes a bandlike area of discomfort in his lower abdomen which radiates to his lower back.  He denies specific unilateral symptoms.  No other radiation of pain or modifying factors.  He did have one episode of vomiting this morning.  He does note history of AAA which is currently being monitored as an outpatient.    Past Medical History:  Diagnosis Date  . AAA (abdominal aortic aneurysm) ([place])   . Chronic kidney disease   . Diabetes mellitus without complication ([place])   . Hypertension     Patient Active Problem List   Diagnosis Date Noted  . Hypertensive urgency 03/18/2019  . Discomfort-Right Foot 09/26/2014  . AAA (abdominal aortic aneurysm) without rupture ([place]) 03/22/2014  . Atherosclerosis of native arteries of the extremities with intermittent claudication 02/08/2014    Past Surgical History:  Procedure Laterality Date  . A/V FISTULAGRAM Left 10/14/2018   Procedure: A/V FISTULAGRAM;  Surgeon: Marty Heck, MD;  Location: [facility];  Service: Cardiovascular;  Laterality: Left;  arm  .  ABDOMINAL AORTAGRAM N/A 02/22/2014   Procedure: ABDOMINAL Maxcine Ham;  Surgeon: Serafina Mitchell, MD;  Location: [facility];  Service: Cardiovascular;  Laterality: N/A;  . AV FISTULA PLACEMENT Left 08/06/2018   Procedure: ARTERIOVENOUS (AV) FISTULA CREATION;  Surgeon: Serafina Mitchell, MD;  Location: [facility];  Service: Vascular;  Laterality: Left;  . BACK SURGERY    . PERIPHERAL VASCULAR BALLOON ANGIOPLASTY Left 10/14/2018   Procedure: PERIPHERAL VASCULAR BALLOON ANGIOPLASTY;  Surgeon: Marty Heck, MD;  Location: [facility];  Service: Cardiovascular;  Laterality: Left;  ARM FISTULA  . SPINE SURGERY      Allergies Patient has no known allergies.  Family History  Problem Relation Age of Onset  . Hypertension Mother   . Deep vein thrombosis Father   . Hypertension Father   . Hypertension Sister   . Hypertension Brother     Social History Social History   Tobacco Use  . Smoking status: Current Every Day Smoker    Packs/day: 0.50    Years: 40.00    Pack years: 20.00    Types: Cigarettes    Last attempt to quit: 07/24/2018    Years since quitting: 0.6  . Smokeless tobacco: Never Used  Substance Use Topics  . Alcohol use: Yes    Alcohol/week: 7.0 standard drinks    Types: 7 Cans of beer per week    Comment: 1 can of beer daily  . Drug use: No    Review of  Systems  Constitutional: No fever/chills Eyes: No visual changes. ENT: No sore throat. Cardiovascular: Denies chest pain. Positive asymptomatic HTN.  Respiratory: Denies shortness of breath. Gastrointestinal: Positive lower abdominal pain.  No nausea, no vomiting.  No diarrhea.  No constipation. Genitourinary: Negative for dysuria. Musculoskeletal: Positive for back pain. Skin: Negative for rash. Neurological: Negative for headaches, focal weakness or numbness.  10-point ROS otherwise negative.  ____________________________________________   PHYSICAL EXAM:  VITAL SIGNS: ED Triage Vitals  Enc Vitals  Group     BP 03/18/19 0804 (!) 231/104     Pulse Rate 03/18/19 0804 83     Resp 03/18/19 0804 19     Temp 03/18/19 0804 98.8 F (37.1 C)     Temp Source 03/18/19 0804 Oral     SpO2 03/18/19 0804 96 %     Weight 03/18/19 0759 174 lb 9.7 oz (79.2 kg)     Height 03/18/19 0759 5\' 9"  (1.753 m)   Constitutional: Alert and oriented. Well appearing and in no acute distress. Eyes: Conjunctivae are normal. Nose: No congestion/rhinnorhea. Mouth/Throat: Mucous membranes are moist.  Neck: No stridor. Cardiovascular: Normal rate, regular rhythm. Good peripheral circulation. Grossly normal heart sounds.   Respiratory: Normal respiratory effort.  No retractions. Lungs CTAB. Gastrointestinal: Soft with mild lower abdominal tenderness worse on the right. No distention.  Musculoskeletal: No lower extremity tenderness nor edema. No gross deformities of extremities. No midline spine tenderness.  Neurologic:  Normal speech and language.  Skin:  Skin is warm, dry and intact. No rash noted.   ____________________________________________   LABS (all labs ordered are listed, but only abnormal results are displayed)  Labs Reviewed  URINALYSIS, ROUTINE W REFLEX MICROSCOPIC - Abnormal; Notable for the following components:      Result Value   Color, Urine STRAW (*)    Glucose, UA >=500 (*)    Ketones, ur 5 (*)    Protein, ur 100 (*)    All other components within normal limits  COMPREHENSIVE METABOLIC PANEL - Abnormal; Notable for the following components:   CO2 19 (*)    Glucose, Bld 209 (*)    BUN 51 (*)    Creatinine, Ser 5.84 (*)    GFR calc non Af Amer 9 (*)    GFR calc Af Amer 11 (*)    All other components within normal limits  CBC WITH DIFFERENTIAL/PLATELET - Abnormal; Notable for the following components:   WBC 10.7 (*)    Hemoglobin 9.8 (*)    HCT 33.8 (*)    MCV 73.5 (*)    MCH 21.3 (*)    MCHC 29.0 (*)    RDW 18.3 (*)    Neutro Abs 9.6 (*)    All other components within normal  limits  COMPREHENSIVE METABOLIC PANEL - Abnormal; Notable for the following components:   Sodium 133 (*)    Chloride 93 (*)    Glucose, Bld 162 (*)    BUN 41 (*)    Creatinine, Ser 5.09 (*)    GFR calc non Af Amer 11 (*)    GFR calc Af Amer 13 (*)    All other components within normal limits  CBC - Abnormal; Notable for the following components:   WBC 13.5 (*)    Hemoglobin 10.7 (*)    HCT 35.5 (*)    MCV 69.3 (*)    MCH 20.9 (*)    RDW 18.6 (*)    All other components within normal limits  SARS CORONAVIRUS  2 (HOSPITAL ORDER, [place] [facility])  MRSA PCR SCREENING  LIPASE, BLOOD  HIV ANTIBODY (ROUTINE TESTING W REFLEX)   ____________________________________________  EKG   EKG Interpretation  Date/Time:  Thursday March 18 2019 08:02:33 EDT Ventricular Rate:  85 PR Interval:    QRS Duration: 95 QT Interval:  391 QTC Calculation: 465 R Axis:   0 Text Interpretation:  Sinus rhythm Probable left atrial enlargement No STEMI  Confirmed by Nanda Quinton [phone]) on 03/18/2019 8:38:03 AM       ____________________________________________  RADIOLOGY  Ct Abdomen Pelvis Wo Contrast  Result Date: 03/18/2019 CLINICAL DATA:  Nausea, vomiting.  Lower abdominal pain. EXAM: CT ABDOMEN AND PELVIS WITHOUT CONTRAST TECHNIQUE: Multidetector CT imaging of the abdomen and pelvis was performed following the standard protocol without IV contrast. COMPARISON:  None. FINDINGS: Lower chest: No acute abnormality. Hepatobiliary: No focal liver abnormality is seen. No gallstones, gallbladder wall thickening, or biliary dilatation. Pancreas: Unremarkable. No pancreatic ductal dilatation or surrounding inflammatory changes. Spleen: Normal in size without focal abnormality. Adrenals/Urinary Tract: Adrenal glands appear normal. Small nonobstructive right renal calculus is noted. No hydronephrosis or renal obstruction is noted. Urinary bladder is unremarkable. Stomach/Bowel: The  stomach and appendix are unremarkable. There is no evidence of bowel obstruction or inflammation. Diverticulosis of descending colon is noted without inflammation. Vascular/Lymphatic: 5.5 cm infrarenal abdominal aortic aneurysm is noted. No adenopathy is noted. Reproductive: Prostate is unremarkable. Other: No abdominal wall hernia or abnormality. No abdominopelvic ascites. Musculoskeletal: No acute or significant osseous findings. IMPRESSION: 5.5 cm infrarenal abdominal aortic aneurysm. Recommend followup by abdomen and pelvis CTA in 3-6 months, and vascular surgery referral/consultation if not already obtained. This recommendation follows ACR consensus guidelines: White Paper of the ACR Incidental Findings Committee II on Vascular Findings. J Am Coll Radiol 2013; 10:789-794. Aortic aneurysm NOS (ICD10-I71.9). Small nonobstructive right renal calculus. No hydronephrosis or renal obstruction is noted. Diverticulosis of descending colon without inflammation. Aortic Atherosclerosis (ICD10-I70.0). Electronically Signed   By: Marijo Conception M.D.   On: 03/18/2019 11:48   US Aorta  Result Date: 03/18/2019 CLINICAL DATA:  History of abdominal aortic aneurysm. EXAM: ULTRASOUND OF ABDOMINAL AORTA TECHNIQUE: Ultrasound examination of the abdominal aorta and proximal common iliac arteries was performed to evaluate for aneurysm. Additional color and Doppler images of the distal aorta were obtained to document patency. COMPARISON:  None available currently. FINDINGS: Abdominal aortic measurements as follows: Proximal:  2.6 cm Mid:  5.2 cm Distal:  2.0 cm Patent: Yes, peak systolic velocity is 89 cm/s Right common iliac artery: 1.4 cm Left common iliac artery: 1.4 cm IMPRESSION: 5.2 cm infrarenal abdominal aortic aneurysm is noted. Recommend followup by abdomen and pelvis CTA in 3-6 months, and vascular surgery referral/consultation if not already obtained. This recommendation follows ACR consensus guidelines: White Paper of  the ACR Incidental Findings Committee II on Vascular Findings. J Am Coll Radiol 2013; 10:789-794. Aortic aneurysm NOS (ICD10-I71.9) Electronically Signed   By: Marijo Conception M.D.   On: 03/18/2019 10:36    ____________________________________________   PROCEDURES  Procedure(s) performed:   Procedures  CRITICAL CARE Performed by: Margette Fast Total critical care time: 60 minutes Critical care time was exclusive of separately billable procedures and treating other patients. Critical care was necessary to treat or prevent imminent or life-threatening deterioration. Critical care was time spent personally by me on the following activities: development of treatment plan with patient and/or surrogate as well as nursing, discussions with consultants, evaluation of  patient's response to treatment, examination of patient, obtaining history from patient or surrogate, ordering and performing treatments and interventions, ordering and review of laboratory studies, ordering and review of radiographic studies, pulse oximetry and re-evaluation of patient's condition.  Nanda Quinton, MD Emergency Medicine  ____________________________________________   INITIAL IMPRESSION / ASSESSMENT AND PLAN / ED COURSE  Pertinent labs & imaging results that were available during my care of the patient were reviewed by me and considered in my medical decision making (see chart for details).   Patient presents to the emergency department for evaluation of asymptomatic hypertension.  He was sent from dialysis for blood pressure management.  I have given him labetalol and will obtain screening lab work.  I am somewhat concerned regarding his new onset abdominal and back discomfort.  Symptoms are more diffuse in the lower abdomen and he has some increased tenderness on the right.  My suspicion for complication of his AAA is lower but will assess further with [place] given his ESRD status.   Spoke with [facility] staff.  They require an SBP < 190 at the center to start HD.   Spoke with Nephrology on call, Dr. Royce Macadamia, who will arrange HD in the ED. Updated patient. Will reassess BP after HD.  ____________________________________________  FINAL CLINICAL IMPRESSION(S) / ED DIAGNOSES  Final diagnoses:  Hypertension, unspecified type  Lower abdominal pain  Nausea     MEDICATIONS GIVEN DURING THIS VISIT:  Medications  Chlorhexidine Gluconate Cloth 2 % PADS 6 each (has no administration in time range)  pentafluoroprop-tetrafluoroeth (GEBAUERS) aerosol 1 application (has no administration in time range)  lidocaine (PF) (XYLOCAINE) 1 % injection 5 mL (has no administration in time range)  lidocaine-prilocaine (EMLA) cream 1 application (has no administration in time range)  0.9 %  sodium chloride infusion (has no administration in time range)  0.9 %  sodium chloride infusion (has no administration in time range)  amLODipine (NORVASC) tablet 5 mg (5 mg Oral Given 03/18/19 1942)  labetalol (NORMODYNE) tablet 300 mg (300 mg Oral Given 03/18/19 1942)  levothyroxine (SYNTHROID) tablet 50 mcg (has no administration in time range)  aspirin EC tablet 81 mg (has no administration in time range)  atorvastatin (LIPITOR) tablet 40 mg (40 mg Oral Not Given 03/19/19 0047)  insulin aspart (novoLOG) injection 0-9 Units (has no administration in time range)  heparin injection 5,000 Units (5,000 Units Subcutaneous Given 03/19/19 0602)  acetaminophen (TYLENOL) tablet 650 mg (has no administration in time range)    Or  acetaminophen (TYLENOL) suppository 650 mg (has no administration in time range)  ondansetron (ZOFRAN) tablet 4 mg (has no administration in time range)    Or  ondansetron (ZOFRAN) injection 4 mg (has no administration in time range)  hydrALAZINE (APRESOLINE) injection 10 mg (has no administration in time range)  labetalol (NORMODYNE) injection 10 mg (10 mg Intravenous Given 03/18/19 0835)  labetalol (NORMODYNE)  injection 20 mg (20 mg Intravenous Given 03/18/19 0955)     NEW OUTPATIENT MEDICATIONS STARTED DURING THIS VISIT:  Current Discharge Medication List    START taking these medications   Details  ondansetron (ZOFRAN ODT) 4 MG disintegrating tablet Take 1 tablet (4 mg total) by mouth every 8 (eight) hours as needed for nausea or vomiting. Qty: 20 tablet, Refills: 0        Note:  This document was prepared using Dragon voice recognition software and may include unintentional dictation errors.  Nanda Quinton, MD Emergency Medicine    , [facility],  MD 03/19/19 4446

## 2019-03-18 NOTE — ED Triage Notes (Addendum)
Pt was at dialysis today and his SBP was found to be over 200. Pt was given his home dose of Labetalol at dialysis and then after an hour his SBP was still over 200. EMS found BP to 230/106. Pt denies dizziness, headache, visual disturbances. Dialysis was not performed today due to hypertension. Pt also c/o lower abdominal pain with vomiting x 1 that started last night. Pt reports lower back started today. Denies diarrhea.

## 2019-03-19 ENCOUNTER — Encounter (HOSPITAL_COMMUNITY): Payer: Self-pay

## 2019-03-19 DIAGNOSIS — D631 Anemia in chronic kidney disease: Secondary | ICD-10-CM | POA: Diagnosis not present

## 2019-03-19 DIAGNOSIS — I160 Hypertensive urgency: Secondary | ICD-10-CM | POA: Diagnosis not present

## 2019-03-19 DIAGNOSIS — N186 End stage renal disease: Secondary | ICD-10-CM | POA: Diagnosis not present

## 2019-03-19 DIAGNOSIS — Z992 Dependence on renal dialysis: Secondary | ICD-10-CM | POA: Diagnosis not present

## 2019-03-19 DIAGNOSIS — E1122 Type 2 diabetes mellitus with diabetic chronic kidney disease: Secondary | ICD-10-CM | POA: Diagnosis not present

## 2019-03-19 LAB — COMPREHENSIVE METABOLIC PANEL
ALT: 23 U/L (ref 0–44)
AST: 29 U/L (ref 15–41)
Albumin: 4.2 g/dL (ref 3.5–5.0)
Alkaline Phosphatase: 68 U/L (ref 38–126)
Anion gap: 14 (ref 5–15)
BUN: 41 mg/dL — ABNORMAL HIGH (ref 8–23)
CO2: 26 mmol/L (ref 22–32)
Calcium: 9 mg/dL (ref 8.9–10.3)
Chloride: 93 mmol/L — ABNORMAL LOW (ref 98–111)
Creatinine, Ser: 5.09 mg/dL — ABNORMAL HIGH (ref 0.61–1.24)
GFR calc Af Amer: 13 mL/min — ABNORMAL LOW (ref >60)
GFR calc non Af Amer: 11 mL/min — ABNORMAL LOW (ref >60)
Glucose, Bld: 162 mg/dL — ABNORMAL HIGH (ref 70–99)
Potassium: 3.5 mmol/L (ref 3.5–5.1)
Sodium: 133 mmol/L — ABNORMAL LOW (ref 135–145)
Total Bilirubin: 0.7 mg/dL (ref 0.3–1.2)
Total Protein: 7.6 g/dL (ref 6.5–8.1)

## 2019-03-19 LAB — CBC
HCT: 35.5 % — ABNORMAL LOW (ref 39.0–52.0)
Hemoglobin: 10.7 g/dL — ABNORMAL LOW (ref 13.0–17.0)
MCH: 20.9 pg — ABNORMAL LOW (ref 26.0–34.0)
MCHC: 30.1 g/dL (ref 30.0–36.0)
MCV: 69.3 fL — ABNORMAL LOW (ref 80.0–100.0)
Platelets: 312 10*3/uL (ref 150–400)
RBC: 5.12 MIL/uL (ref 4.22–5.81)
RDW: 18.6 % — ABNORMAL HIGH (ref 11.5–15.5)
WBC: 13.5 10*3/uL — ABNORMAL HIGH (ref 4.0–10.5)
nRBC: 0 % (ref 0.0–0.2)

## 2019-03-19 LAB — GLUCOSE, CAPILLARY
Glucose-Capillary: 158 mg/dL — ABNORMAL HIGH (ref 70–99)
Glucose-Capillary: 184 mg/dL — ABNORMAL HIGH (ref 70–99)

## 2019-03-19 LAB — MRSA PCR SCREENING: MRSA by PCR: NEGATIVE

## 2019-03-19 MED ORDER — LABETALOL HCL 200 MG PO TABS
400.0000 mg | ORAL_TABLET | Freq: Two times a day (BID) | ORAL | Status: DC
  Administered 2019-03-19: 10:00:00 400 mg via ORAL
  Filled 2019-03-19: qty 2

## 2019-03-19 MED ORDER — ASPIRIN EC 81 MG PO TBEC: 81.0000 mg | DELAYED_RELEASE_TABLET | Freq: Every day | ORAL | 2 refills | Status: AC

## 2019-03-19 MED ORDER — LABETALOL HCL 200 MG PO TABS: 400.0000 mg | ORAL_TABLET | Freq: Two times a day (BID) | ORAL | 3 refills | Status: AC

## 2019-03-19 MED ORDER — AMLODIPINE BESYLATE 10 MG PO TABS: 10.0000 mg | ORAL_TABLET | Freq: Every day | ORAL | 3 refills | Status: DC

## 2019-03-19 MED ORDER — LEVOTHYROXINE SODIUM 50 MCG PO TABS: 50.0000 ug | ORAL_TABLET | Freq: Every day | ORAL | 3 refills | Status: AC

## 2019-03-19 MED ORDER — AMLODIPINE BESYLATE 5 MG PO TABS
10.0000 mg | ORAL_TABLET | Freq: Every day | ORAL | Status: DC
  Administered 2019-03-19: 10:00:00 10 mg via ORAL
  Filled 2019-03-19: qty 2

## 2019-03-19 MED ORDER — GLIPIZIDE ER 2.5 MG PO TB24: 2.5000 mg | ORAL_TABLET | Freq: Every day | ORAL | 2 refills | Status: DC

## 2019-03-19 MED ORDER — ATORVASTATIN CALCIUM 40 MG PO TABS: 40.0000 mg | ORAL_TABLET | Freq: Every day | ORAL | 2 refills | Status: DC

## 2019-03-19 MED ORDER — HYDRALAZINE HCL 25 MG PO TABS
50.0000 mg | ORAL_TABLET | Freq: Three times a day (TID) | ORAL | Status: DC
  Administered 2019-03-19: 50 mg via ORAL
  Filled 2019-03-19: qty 2

## 2019-03-19 MED ORDER — ISOSORBIDE MONONITRATE ER 60 MG PO TB24
30.0000 mg | ORAL_TABLET | Freq: Every day | ORAL | Status: DC
  Administered 2019-03-19: 30 mg via ORAL
  Filled 2019-03-19: qty 1

## 2019-03-19 MED ORDER — ACETAMINOPHEN 325 MG PO TABS: 650.0000 mg | ORAL_TABLET | Freq: Four times a day (QID) | ORAL | 0 refills | Status: AC | PRN

## 2019-03-19 MED ORDER — HYDRALAZINE HCL 25 MG PO TABS: 25.0000 mg | ORAL_TABLET | Freq: Three times a day (TID) | ORAL | 3 refills | Status: DC

## 2019-03-19 NOTE — Plan of Care (Signed)
  Problem: Education: Goal: Knowledge of General Education information will improve Description: Including pain rating scale, medication(s)/side effects and non-pharmacologic comfort measures Outcome: Progressing   Problem: Clinical Measurements: Goal: Will remain free from infection Outcome: Progressing   Problem: Clinical Measurements: Goal: Respiratory complications will improve Outcome: Progressing   Problem: Pain Managment: Goal: General experience of comfort will improve Outcome: Progressing

## 2019-03-19 NOTE — Discharge Instructions (Signed)
1)Very low-salt diet advised 2)Weigh yourself daily, call if you gain more than 3 pounds in 1 day or more than 5 pounds in 1 week 3)Limit your Fluid  intake to no more than 50 ounces (1.5 Liters) per day 4) okay to resume hemodialysis per your usual schedule 5) your blood pressure medications have been adjusted--- okay to take your blood pressure medication even on your hemodialysis days

## 2019-03-19 NOTE — Discharge Summary (Signed)
Juan Bass, is a 68 y.o. male  DOB 29-Nov-1950  MRN [id number].  Admission date:  03/18/2019  Admitting Physician  Caren Griffins, MD  Discharge Date:  03/19/2019   Primary MD  Sharilyn Sites, MD  Recommendations for primary care physician for things to follow:   1)Very low-salt diet advised 2)Weigh yourself daily, call if you gain more than 3 pounds in 1 day or more than 5 pounds in 1 week 3)Limit your Fluid  intake to no more than 50 ounces (1.5 Liters) per day 4) okay to resume hemodialysis per your usual schedule 5) your blood pressure medications have been adjusted--- okay to take your blood pressure medication even on your hemodialysis days  Admission Diagnosis  Nausea [R11.0] Lower abdominal pain [R10.30] Hypertension, unspecified type [I10]   Discharge Diagnosis  Nausea [R11.0] Lower abdominal pain [R10.30] Hypertension, unspecified type [I10]    Active Problems:   Hypertensive urgency      Past Medical History:  Diagnosis Date  . AAA (abdominal aortic aneurysm) ([place])   . Chronic kidney disease   . Diabetes mellitus without complication ([place])   . Hypertension     Past Surgical History:  Procedure Laterality Date  . A/V FISTULAGRAM Left 10/14/2018   Procedure: A/V FISTULAGRAM;  Surgeon: Marty Heck, MD;  Location: [facility];  Service: Cardiovascular;  Laterality: Left;  arm  . ABDOMINAL AORTAGRAM N/A 02/22/2014   Procedure: ABDOMINAL Maxcine Ham;  Surgeon: Serafina Mitchell, MD;  Location: [facility] CATH LAB;  Service: Cardiovascular;  Laterality: N/A;  . AV FISTULA PLACEMENT Left 08/06/2018   Procedure: ARTERIOVENOUS (AV) FISTULA CREATION;  Surgeon: Serafina Mitchell, MD;  Location: [facility];  Service: Vascular;  Laterality: Left;  . BACK SURGERY    . PERIPHERAL VASCULAR BALLOON ANGIOPLASTY Left 10/14/2018   Procedure: PERIPHERAL VASCULAR BALLOON ANGIOPLASTY;  Surgeon: Marty Heck, MD;  Location: [place] [facility];  Service: Cardiovascular;  Laterality: Left;  ARM FISTULA  . SPINE SURGERY         HPI  from the history and physical done on the day of admission:    - PCP: Sharilyn Sites, MD  Patient coming from: home  Chief Complaint: high blood pressure   HPI: Juan Bass is a 68 y.o. male with medical history significant of ESRD on HD, DM, HTN, AAA who presents to the hospital after being sent from the dialysis unit where his systolic blood pressure was in the 240 range.  He was told to hold his home medications prior to dialysis and today was the first time he has done that.  Nephrology was consulted and patient underwent dialysis in the emergency room with 2-1/2 L of fluid removed, received IV labetalol but there was no improvement in his blood pressure and currently is in the low 200s and we are asked to admit.  Patient has no chest pain, no shortness of breath, no abdominal pain, no nausea or vomiting.  He denies any lightheadedness or dizziness.  He  denies any headaches.  Of abdominal pain to the EDP and underwent a CT angiogram which showed his known AAA at 5.5 cm.  Given persistent of significant high blood pressure after dialysis, nephrology recommended admission overnight for blood pressure control.  We are asked to admit.  Review of Systems: As per HPI otherwise 10 point review of systems negative.    Hospital Course:     -Principal Problem Hypertensive urgency -Patient systolic blood pressure after dialysis ranging between 542-706 systolic.  He is completely asymptomatic.  --BP remains elevated after hemodialysis sessions, -Recommend changing labetalol to 400 mg twice daily, continue amlodipine at 10 mg daily, added Imdur/hydralazine combo -Low-salt diet advised -Follow-up with PCP for BP recheck within a week   Active Problems End-stage renal disease -Dialyzed in the ED on 03/18/2019,  -Continue HD per usual schedule as  outpatient T/T/S -Appears euvolemic   Ttype 2 diabetes mellitus -Stable, resume home regimen  Hyperlipidemia -Continue statin  Hypothyroidism -Continue Synthroid  Known AAA -outpatient follow up-- -patient reminded to follow-up for AAA measuring 5.5 cm  Code Status: Full code  Consults called: Nephrology    Discharge Condition: stable  Follow UP  Follow-up Information    Sharilyn Sites, MD Follow up on 03/24/2019.   Specialty: Family Medicine Why: Wednesday at 9:15 for your hospital follow up appointment Contact information: [address] [place] [address] [phone]          Diet and Activity recommendation:  As advised  Discharge Instructions    Discharge Instructions    Call MD for:  difficulty breathing, headache or visual disturbances   Complete by: As directed    Call MD for:  persistant dizziness or light-headedness   Complete by: As directed    Call MD for:  persistant nausea and vomiting   Complete by: As directed    Call MD for:  severe uncontrolled pain   Complete by: As directed    Call MD for:  temperature >100.4   Complete by: As directed    Diet - low sodium heart healthy   Complete by: As directed    Discharge instructions   Complete by: As directed    1)Very low-salt diet advised 2)Weigh yourself daily, call if you gain more than 3 pounds in 1 day or more than 5 pounds in 1 week 3)Limit your Fluid  intake to no more than 50 ounces (1.5 Liters) per day 4) okay to resume hemodialysis per your usual schedule 5) your blood pressure medications have been adjusted--- okay to take your blood pressure medication even on your hemodialysis days   Increase activity slowly   Complete by: As directed        Discharge Medications     Allergies as of 03/19/2019   No Known Allergies     Medication List    TAKE these medications   acetaminophen 325 MG tablet Commonly known as: TYLENOL Take 2 tablets (650 mg total) by mouth  every 6 (six) hours as needed for mild pain (or Fever >/= 101).   amLODipine 10 MG tablet Commonly known as: NORVASC Take 1 tablet (10 mg total) by mouth daily.   aspirin EC 81 MG tablet Take 1 tablet (81 mg total) by mouth daily with breakfast. What changed: when to take this   atorvastatin 40 MG tablet Commonly known as: LIPITOR Take 1 tablet (40 mg total) by mouth daily.   glipiZIDE 2.5 MG 24 hr tablet Commonly known as: GLUCOTROL XL Take 1 tablet (  2.5 mg total) by mouth daily with breakfast.   hydrALAZINE 25 MG tablet Commonly known as: APRESOLINE Take 1 tablet (25 mg total) by mouth 3 (three) times daily. For BP   labetalol 200 MG tablet Commonly known as: NORMODYNE Take 2 tablets (400 mg total) by mouth 2 (two) times daily. What changed: how much to take   levothyroxine 50 MCG tablet Commonly known as: SYNTHROID Take 1 tablet (50 mcg total) by mouth daily before breakfast.   ondansetron 4 MG disintegrating tablet Commonly known as: Zofran ODT Take 1 tablet (4 mg total) by mouth every 8 (eight) hours as needed for nausea or vomiting.   Vitamin D3 50 MCG (2000 UT) Tabs Take 2,000 Units by mouth 4 (four) times a week.       Major procedures and Radiology Reports - PLEASE review detailed and final reports for all details, in brief -   Ct Abdomen Pelvis Wo Contrast  Result Date: 03/18/2019 CLINICAL DATA:  Nausea, vomiting.  Lower abdominal pain. EXAM: CT ABDOMEN AND PELVIS WITHOUT CONTRAST TECHNIQUE: Multidetector CT imaging of the abdomen and pelvis was performed following the standard protocol without IV contrast. COMPARISON:  None. FINDINGS: Lower chest: No acute abnormality. Hepatobiliary: No focal liver abnormality is seen. No gallstones, gallbladder wall thickening, or biliary dilatation. Pancreas: Unremarkable. No pancreatic ductal dilatation or surrounding inflammatory changes. Spleen: Normal in size without focal abnormality. Adrenals/Urinary Tract: Adrenal  glands appear normal. Small nonobstructive right renal calculus is noted. No hydronephrosis or renal obstruction is noted. Urinary bladder is unremarkable. Stomach/Bowel: The stomach and appendix are unremarkable. There is no evidence of bowel obstruction or inflammation. Diverticulosis of descending colon is noted without inflammation. Vascular/Lymphatic: 5.5 cm infrarenal abdominal aortic aneurysm is noted. No adenopathy is noted. Reproductive: Prostate is unremarkable. Other: No abdominal wall hernia or abnormality. No abdominopelvic ascites. Musculoskeletal: No acute or significant osseous findings. IMPRESSION: 5.5 cm infrarenal abdominal aortic aneurysm. Recommend followup by abdomen and pelvis CTA in 3-6 months, and vascular surgery referral/consultation if not already obtained. This recommendation follows ACR consensus guidelines: White Paper of the ACR Incidental Findings Committee II on Vascular Findings. J Am Coll Radiol 2013; 10:789-794. Aortic aneurysm NOS (ICD10-I71.9). Small nonobstructive right renal calculus. No hydronephrosis or renal obstruction is noted. Diverticulosis of descending colon without inflammation. Aortic Atherosclerosis (ICD10-I70.0). Electronically Signed   By: Marijo Conception M.D.   On: 03/18/2019 11:48   US Aorta  Result Date: 03/18/2019 CLINICAL DATA:  History of abdominal aortic aneurysm. EXAM: ULTRASOUND OF ABDOMINAL AORTA TECHNIQUE: Ultrasound examination of the abdominal aorta and proximal common iliac arteries was performed to evaluate for aneurysm. Additional color and Doppler images of the distal aorta were obtained to document patency. COMPARISON:  None available currently. FINDINGS: Abdominal aortic measurements as follows: Proximal:  2.6 cm Mid:  5.2 cm Distal:  2.0 cm Patent: Yes, peak systolic velocity is 89 cm/s Right common iliac artery: 1.4 cm Left common iliac artery: 1.4 cm IMPRESSION: 5.2 cm infrarenal abdominal aortic aneurysm is noted. Recommend followup by  abdomen and pelvis CTA in 3-6 months, and vascular surgery referral/consultation if not already obtained. This recommendation follows ACR consensus guidelines: White Paper of the ACR Incidental Findings Committee II on Vascular Findings. J Am Coll Radiol 2013; 10:789-794. Aortic aneurysm NOS (ICD10-I71.9) Electronically Signed   By: Marijo Conception M.D.   On: 03/18/2019 10:36    Micro Results    Recent Results (from the past 240 hour(s))  SARS Coronavirus 2 [facility] order,  Performed in [facility] hospital lab) Nasopharyngeal Nasopharyngeal Swab     Status: None   Collection Time: 03/18/19 11:08 AM   Specimen: Nasopharyngeal Swab  Result Value Ref Range Status   SARS Coronavirus 2 NEGATIVE NEGATIVE Final    Comment: (NOTE) If result is NEGATIVE SARS-CoV-2 target nucleic acids are NOT DETECTED. The SARS-CoV-2 RNA is generally detectable in upper and lower  respiratory specimens during the acute phase of infection. The lowest  concentration of SARS-CoV-2 viral copies this assay can detect is 250  copies / mL. A negative result does not preclude SARS-CoV-2 infection  and should not be used as the sole basis for treatment or other  patient management decisions.  A negative result may occur with  improper specimen collection / handling, submission of specimen other  than nasopharyngeal swab, presence of viral mutation(s) within the  areas targeted by this assay, and inadequate number of viral copies  (<250 copies / mL). A negative result must be combined with clinical  observations, patient history, and epidemiological information. If result is POSITIVE SARS-CoV-2 target nucleic acids are DETECTED. The SARS-CoV-2 RNA is generally detectable in upper and lower  respiratory specimens dur ing the acute phase of infection.  Positive  results are indicative of active infection with SARS-CoV-2.  Clinical  correlation with patient history and other diagnostic information is  necessary to  determine patient infection status.  Positive results do  not rule out bacterial infection or co-infection with other viruses. If result is PRESUMPTIVE POSTIVE SARS-CoV-2 nucleic acids MAY BE PRESENT.   A presumptive positive result was obtained on the submitted specimen  and confirmed on repeat testing.  While 2019 novel coronavirus  (SARS-CoV-2) nucleic acids may be present in the submitted sample  additional confirmatory testing may be necessary for epidemiological  and / or clinical management purposes  to differentiate between  SARS-CoV-2 and other Sarbecovirus currently known to infect humans.  If clinically indicated additional testing with an alternate test  methodology [phone]) is advised. The SARS-CoV-2 RNA is generally  detectable in upper and lower respiratory sp ecimens during the acute  phase of infection. The expected result is Negative. Fact Sheet for Patients:  StrictlyIdeas.no Fact Sheet for Healthcare Providers: BankingDealers.co.za This test is not yet approved or cleared by the [place] FDA and has been authorized for detection and/or diagnosis of SARS-CoV-2 by FDA under an Emergency Use Authorization (EUA).  This EUA will remain in effect (meaning this test can be used) for the duration of the COVID-19 declaration under Section 564(b)(1) of the Act, 21 U.S.C. section 360bbb-3(b)(1), unless the authorization is terminated or revoked sooner. Performed at [facility], [address], [place], [place] [address]   MRSA PCR Screening     Status: None   Collection Time: 03/19/19  8:00 AM   Specimen: Nasal Mucosa; Nasopharyngeal  Result Value Ref Range Status   MRSA by PCR NEGATIVE NEGATIVE Final    Comment:        The GeneXpert MRSA Assay (FDA approved for NASAL specimens only), is one component of a comprehensive MRSA colonization surveillance program. It is not intended to diagnose MRSA infection nor to guide  or monitor treatment for MRSA infections. Performed at [facility], [address], [place], [place] [address]        Today   Subjective    Juan Bass today has no new concerns --Elevated BP noted but patient states he has no chest pains no palpitations no dizziness no headaches  no shortness of breath  --Patient eager to go home          Patient has been seen and examined prior to discharge   Objective   Blood pressure (!) 167/84, pulse 80, temperature 98.5 F (36.9 C), temperature source Oral, resp. rate 18, height 5\' 9"  (1.753 m), weight 79.2 kg, SpO2 99 %.   Intake/Output Summary (Last 24 hours) at 03/19/2019 1252 Last data filed at 03/19/2019 0919 Gross per 24 hour  Intake 240 ml  Output 2802 ml  Net -2562 ml   Exam Gen:- Awake Alert, no acute distress  HEENT:- [place].AT, No sclera icterus Neck-Supple Neck,No JVD,.  Lungs-  CTAB , good air movement bilaterally  CV- S1, S2 normal, regular Abd-  +ve B.Sounds, Abd Soft, No tenderness,    Extremity/Skin:- No  edema,   good pulses Psych-affect is appropriate, oriented x3 Neuro-no new focal deficits, no tremors  MSK-left arm AV fistula with positive thrill and bruit   Data Review   CBC w Diff:  Lab Results  Component Value Date   WBC 13.5 (H) 03/19/2019   HGB 10.7 (L) 03/19/2019   HCT 35.5 (L) 03/19/2019   PLT 312 03/19/2019   LYMPHOPCT 7 03/18/2019   MONOPCT 3 03/18/2019   EOSPCT 0 03/18/2019   BASOPCT 0 03/18/2019   CMP:  Lab Results  Component Value Date   NA 133 (L) 03/19/2019   K 3.5 03/19/2019   CL 93 (L) 03/19/2019   CO2 26 03/19/2019   BUN 41 (H) 03/19/2019   CREATININE 5.09 (H) 03/19/2019   PROT 7.6 03/19/2019   ALBUMIN 4.2 03/19/2019   BILITOT 0.7 03/19/2019   ALKPHOS 68 03/19/2019   AST 29 03/19/2019   ALT 23 03/19/2019  .   Total Discharge time is about 33 minutes  Roxan Hockey M.D on 03/19/2019 at 12:52 PM  Go to [email or web address] -  for contact info  [facility] -  Office  [phone]

## 2019-03-19 NOTE — Progress Notes (Signed)
[name], male   DOB: May 21, 1951, 68 y.o.   MRN: [id number] S: Feels well, no complaints.  Denies HA, CP, SOB, abdominal pain.   O:BP (!) 191/95 (BP Location: Right Arm)   Pulse 86   Temp 98.5 F (36.9 C)   Resp 16   Ht 5\' 9"  (1.753 m)   Wt 79.2 kg   SpO2 97%   BMI 25.78 kg/m   Intake/Output Summary (Last 24 hours) at 03/19/2019 0940 Last data filed at 03/19/2019 0919 Gross per 24 hour  Intake 240 ml  Output 2802 ml  Net -2562 ml   Intake/Output: I/O last 3 completed shifts: In: -  Out: 2802 [Other:2802]  Intake/Output this shift:  Total I/O In: 240 [P.O.:240] Out: -  Weight change:  Gen:NAD CVS: no rub Resp: cta Abd: benign Ext: no edema, LUE AVF +T/B  Recent Labs  Lab 03/18/19 0849 03/19/19 0609  NA 135 133*  K 4.1 3.5  CL 103 93*  CO2 19* 26  GLUCOSE 209* 162*  BUN 51* 41*  CREATININE 5.84* 5.09*  ALBUMIN 4.2 4.2  CALCIUM 9.1 9.0  AST 19 29  ALT 24 23   Liver Function Tests: Recent Labs  Lab 03/18/19 0849 03/19/19 0609  AST 19 29  ALT 24 23  ALKPHOS 66 68  BILITOT 0.7 0.7  PROT 7.0 7.6  ALBUMIN 4.2 4.2   Recent Labs  Lab 03/18/19 0849  LIPASE 43   No results for input(s): AMMONIA in the last 168 hours. CBC: Recent Labs  Lab 03/18/19 0849 03/19/19 0609  WBC 10.7* 13.5*  NEUTROABS 9.6*  --   HGB 9.8* 10.7*  HCT 33.8* 35.5*  MCV 73.5* 69.3*  PLT 317 312   Cardiac Enzymes: No results for input(s): CKTOTAL, CKMB, CKMBINDEX, TROPONINI in the last 168 hours. CBG: Recent Labs  Lab 03/19/19 0735  GLUCAP 158*    Iron Studies: No results for input(s): IRON, TIBC, TRANSFERRIN, FERRITIN in the last 72 hours. Studies/Results: Ct Abdomen Pelvis Wo Contrast  Result Date: 03/18/2019 CLINICAL DATA:  Nausea, vomiting.  Lower abdominal pain. EXAM: CT ABDOMEN AND PELVIS WITHOUT CONTRAST TECHNIQUE: Multidetector CT imaging of the abdomen and pelvis was performed following the standard protocol without IV contrast.  COMPARISON:  None. FINDINGS: Lower chest: No acute abnormality. Hepatobiliary: No focal liver abnormality is seen. No gallstones, gallbladder wall thickening, or biliary dilatation. Pancreas: Unremarkable. No pancreatic ductal dilatation or surrounding inflammatory changes. Spleen: Normal in size without focal abnormality. Adrenals/Urinary Tract: Adrenal glands appear normal. Small nonobstructive right renal calculus is noted. No hydronephrosis or renal obstruction is noted. Urinary bladder is unremarkable. Stomach/Bowel: The stomach and appendix are unremarkable. There is no evidence of bowel obstruction or inflammation. Diverticulosis of descending colon is noted without inflammation. Vascular/Lymphatic: 5.5 cm infrarenal abdominal aortic aneurysm is noted. No adenopathy is noted. Reproductive: Prostate is unremarkable. Other: No abdominal wall hernia or abnormality. No abdominopelvic ascites. Musculoskeletal: No acute or significant osseous findings. IMPRESSION: 5.5 cm infrarenal abdominal aortic aneurysm. Recommend followup by abdomen and pelvis CTA in 3-6 months, and vascular surgery referral/consultation if not already obtained. This recommendation follows ACR consensus guidelines: White Paper of the ACR Incidental Findings Committee II on Vascular Findings. J Am Coll Radiol 2013; 10:789-794. Aortic aneurysm NOS (ICD10-I71.9). Small nonobstructive right renal calculus. No hydronephrosis or renal obstruction is noted. Diverticulosis of descending colon without inflammation. Aortic Atherosclerosis (ICD10-I70.0). Electronically Signed   By: Marijo Conception M.D.   On: 03/18/2019 11:48  US Aorta  Result Date: 03/18/2019 CLINICAL DATA:  History of abdominal aortic aneurysm. EXAM: ULTRASOUND OF ABDOMINAL AORTA TECHNIQUE: Ultrasound examination of the abdominal aorta and proximal common iliac arteries was performed to evaluate for aneurysm. Additional color and Doppler images of the distal aorta were obtained to  document patency. COMPARISON:  None available currently. FINDINGS: Abdominal aortic measurements as follows: Proximal:  2.6 cm Mid:  5.2 cm Distal:  2.0 cm Patent: Yes, peak systolic velocity is 89 cm/s Right common iliac artery: 1.4 cm Left common iliac artery: 1.4 cm IMPRESSION: 5.2 cm infrarenal abdominal aortic aneurysm is noted. Recommend followup by abdomen and pelvis CTA in 3-6 months, and vascular surgery referral/consultation if not already obtained. This recommendation follows ACR consensus guidelines: White Paper of the ACR Incidental Findings Committee II on Vascular Findings. J Am Coll Radiol 2013; 10:789-794. Aortic aneurysm NOS (ICD10-I71.9) Electronically Signed   By: Marijo Conception M.D.   On: 03/18/2019 10:36   . amLODipine  10 mg Oral Daily  . aspirin EC  81 mg Oral Daily  . atorvastatin  40 mg Oral q1800  . Chlorhexidine Gluconate Cloth  6 each Topical Q0600  . heparin  5,000 Units Subcutaneous Q8H  . hydrALAZINE  50 mg Oral TID  . insulin aspart  0-9 Units Subcutaneous TID WC  . isosorbide mononitrate  30 mg Oral Daily  . labetalol  400 mg Oral BID  . levothyroxine  50 mcg Oral Q0600    BMET    Component Value Date/Time   NA 133 (L) 03/19/2019 0609   K 3.5 03/19/2019 0609   CL 93 (L) 03/19/2019 0609   CO2 26 03/19/2019 0609   GLUCOSE 162 (H) 03/19/2019 0609   BUN 41 (H) 03/19/2019 0609   CREATININE 5.09 (H) 03/19/2019 0609   CALCIUM 9.0 03/19/2019 0609   GFRNONAA 11 (L) 03/19/2019 0609   GFRAA 13 (L) 03/19/2019 0609   CBC    Component Value Date/Time   WBC 13.5 (H) 03/19/2019 0609   RBC 5.12 03/19/2019 0609   HGB 10.7 (L) 03/19/2019 0609   HCT 35.5 (L) 03/19/2019 0609   PLT 312 03/19/2019 0609   MCV 69.3 (L) 03/19/2019 0609   MCH 20.9 (L) 03/19/2019 0609   MCHC 30.1 03/19/2019 0609   RDW 18.6 (H) 03/19/2019 0609   LYMPHSABS 0.7 03/18/2019 0849   MONOABS 0.3 03/18/2019 0849   EOSABS 0.0 03/18/2019 0849   BASOSABS 0.0 03/18/2019 0849   Dialysis orders   [facility] number is [phone]. 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  He is completely asymptomatic and despite HD yesterday with UF of almost 3 liters, his BP did not drop significantly.  He does smoke cigarettes and denies any illicit drug use.   1. I would recommend starting amlodipine 10mg  at bedtime and increase labetalol to 400 mg bid.   2. He will f/u with his home HD unit tomorrow and would recommend dropping his edw  to 76 kg. 2. ESRD- continue with HD qTTS and challenge edw as above 3. Anemia of CKD- cont with ESA 4. SHPTH- resume outpatient meds 5. AAA- stable and f/u CTA in 3-6 months and f/u with vascular surgery 6. DM- per primary team 7. Disposition- he is asymptomatic and BP has improved from presentation.  Stable for discharge to home with above medication changes and go to HD tomorrow at his outpatient unit.   Donetta Potts, MD [employer] [phone]

## 2019-03-20 LAB — HIV ANTIBODY (ROUTINE TESTING W REFLEX): HIV Screen 4th Generation wRfx: NONREACTIVE

## 2019-03-24 DIAGNOSIS — I10 Essential (primary) hypertension: Secondary | ICD-10-CM | POA: Diagnosis not present

## 2019-03-24 DIAGNOSIS — Z6825 Body mass index (BMI) 25.0-25.9, adult: Secondary | ICD-10-CM | POA: Diagnosis not present

## 2019-03-24 DIAGNOSIS — I779 Disorder of arteries and arterioles, unspecified: Secondary | ICD-10-CM | POA: Diagnosis not present

## 2019-03-24 DIAGNOSIS — E7849 Other hyperlipidemia: Secondary | ICD-10-CM | POA: Diagnosis not present

## 2019-03-24 DIAGNOSIS — Z992 Dependence on renal dialysis: Secondary | ICD-10-CM | POA: Diagnosis not present

## 2019-03-24 DIAGNOSIS — I714 Abdominal aortic aneurysm, without rupture: Secondary | ICD-10-CM | POA: Diagnosis not present

## 2019-03-24 DIAGNOSIS — Z23 Encounter for immunization: Secondary | ICD-10-CM | POA: Diagnosis not present

## 2019-04-07 DIAGNOSIS — N186 End stage renal disease: Secondary | ICD-10-CM | POA: Diagnosis not present

## 2019-04-07 DIAGNOSIS — Z992 Dependence on renal dialysis: Secondary | ICD-10-CM | POA: Diagnosis not present

## 2019-04-08 DIAGNOSIS — Z992 Dependence on renal dialysis: Secondary | ICD-10-CM | POA: Diagnosis not present

## 2019-04-08 DIAGNOSIS — N186 End stage renal disease: Secondary | ICD-10-CM | POA: Diagnosis not present

## 2019-04-08 DIAGNOSIS — N2581 Secondary hyperparathyroidism of renal origin: Secondary | ICD-10-CM | POA: Diagnosis not present

## 2019-04-08 DIAGNOSIS — D631 Anemia in chronic kidney disease: Secondary | ICD-10-CM | POA: Diagnosis not present

## 2019-05-08 DIAGNOSIS — Z992 Dependence on renal dialysis: Secondary | ICD-10-CM | POA: Diagnosis not present

## 2019-05-08 DIAGNOSIS — N186 End stage renal disease: Secondary | ICD-10-CM | POA: Diagnosis not present

## 2019-05-11 DIAGNOSIS — D631 Anemia in chronic kidney disease: Secondary | ICD-10-CM | POA: Diagnosis not present

## 2019-05-11 DIAGNOSIS — Z992 Dependence on renal dialysis: Secondary | ICD-10-CM | POA: Diagnosis not present

## 2019-05-11 DIAGNOSIS — D509 Iron deficiency anemia, unspecified: Secondary | ICD-10-CM | POA: Diagnosis not present

## 2019-05-11 DIAGNOSIS — N2581 Secondary hyperparathyroidism of renal origin: Secondary | ICD-10-CM | POA: Diagnosis not present

## 2019-05-11 DIAGNOSIS — N186 End stage renal disease: Secondary | ICD-10-CM | POA: Diagnosis not present

## 2019-06-07 DIAGNOSIS — N186 End stage renal disease: Secondary | ICD-10-CM | POA: Diagnosis not present

## 2019-06-07 DIAGNOSIS — Z992 Dependence on renal dialysis: Secondary | ICD-10-CM | POA: Diagnosis not present

## 2019-06-08 DIAGNOSIS — D631 Anemia in chronic kidney disease: Secondary | ICD-10-CM | POA: Diagnosis not present

## 2019-06-08 DIAGNOSIS — D509 Iron deficiency anemia, unspecified: Secondary | ICD-10-CM | POA: Diagnosis not present

## 2019-06-08 DIAGNOSIS — N2581 Secondary hyperparathyroidism of renal origin: Secondary | ICD-10-CM | POA: Diagnosis not present

## 2019-06-08 DIAGNOSIS — Z992 Dependence on renal dialysis: Secondary | ICD-10-CM | POA: Diagnosis not present

## 2019-06-08 DIAGNOSIS — N186 End stage renal disease: Secondary | ICD-10-CM | POA: Diagnosis not present

## 2019-07-08 DIAGNOSIS — N186 End stage renal disease: Secondary | ICD-10-CM | POA: Diagnosis not present

## 2019-07-08 DIAGNOSIS — Z992 Dependence on renal dialysis: Secondary | ICD-10-CM | POA: Diagnosis not present

## 2019-07-10 DIAGNOSIS — D631 Anemia in chronic kidney disease: Secondary | ICD-10-CM | POA: Diagnosis not present

## 2019-07-10 DIAGNOSIS — D509 Iron deficiency anemia, unspecified: Secondary | ICD-10-CM | POA: Diagnosis not present

## 2019-07-10 DIAGNOSIS — Z992 Dependence on renal dialysis: Secondary | ICD-10-CM | POA: Diagnosis not present

## 2019-07-10 DIAGNOSIS — N2581 Secondary hyperparathyroidism of renal origin: Secondary | ICD-10-CM | POA: Diagnosis not present

## 2019-07-10 DIAGNOSIS — N186 End stage renal disease: Secondary | ICD-10-CM | POA: Diagnosis not present

## 2019-08-08 DIAGNOSIS — N186 End stage renal disease: Secondary | ICD-10-CM | POA: Diagnosis not present

## 2019-08-08 DIAGNOSIS — Z992 Dependence on renal dialysis: Secondary | ICD-10-CM | POA: Diagnosis not present

## 2019-08-10 DIAGNOSIS — N186 End stage renal disease: Secondary | ICD-10-CM | POA: Diagnosis not present

## 2019-08-10 DIAGNOSIS — Z992 Dependence on renal dialysis: Secondary | ICD-10-CM | POA: Diagnosis not present

## 2019-08-10 DIAGNOSIS — N2581 Secondary hyperparathyroidism of renal origin: Secondary | ICD-10-CM | POA: Diagnosis not present

## 2019-08-27 ENCOUNTER — Encounter: Payer: PPO | Admitting: Vascular Surgery

## 2019-09-05 ENCOUNTER — Ambulatory Visit: Payer: PPO | Attending: Internal Medicine

## 2019-09-05 ENCOUNTER — Other Ambulatory Visit: Payer: Self-pay

## 2019-09-05 DIAGNOSIS — N186 End stage renal disease: Secondary | ICD-10-CM | POA: Diagnosis not present

## 2019-09-05 DIAGNOSIS — Z992 Dependence on renal dialysis: Secondary | ICD-10-CM | POA: Diagnosis not present

## 2019-09-05 DIAGNOSIS — Z23 Encounter for immunization: Secondary | ICD-10-CM | POA: Insufficient documentation

## 2019-09-05 NOTE — Progress Notes (Signed)
   Covid-19 Vaccination Clinic  Name:  Juan Bass    MRN: [id number] DOB: 10-07-1950  09/05/2019  [name] was observed post Covid-19 immunization for 15 minutes without incidence. He was provided with Vaccine Information Sheet and instruction to access the V-Safe system.   [name] was instructed to call 911 with any severe reactions post vaccine: Marland Kitchen Difficulty breathing  . Swelling of your face and throat  . A fast heartbeat  . A bad rash all over your body  . Dizziness and weakness    Immunizations Administered    Name Date Dose VIS Date Route   Pfizer COVID-19 Vaccine 09/05/2019  1:49 PM 0.3 mL 06/18/2019 Intramuscular   Manufacturer: [employer]   Lot: [id number]   [place]: [address]-7

## 2019-09-06 ENCOUNTER — Telehealth (HOSPITAL_COMMUNITY): Payer: Self-pay

## 2019-09-06 DIAGNOSIS — D509 Iron deficiency anemia, unspecified: Secondary | ICD-10-CM | POA: Diagnosis not present

## 2019-09-06 DIAGNOSIS — N2581 Secondary hyperparathyroidism of renal origin: Secondary | ICD-10-CM | POA: Diagnosis not present

## 2019-09-06 DIAGNOSIS — Z992 Dependence on renal dialysis: Secondary | ICD-10-CM | POA: Diagnosis not present

## 2019-09-06 DIAGNOSIS — N186 End stage renal disease: Secondary | ICD-10-CM | POA: Diagnosis not present

## 2019-09-06 DIAGNOSIS — D631 Anemia in chronic kidney disease: Secondary | ICD-10-CM | POA: Diagnosis not present

## 2019-09-06 NOTE — Telephone Encounter (Signed)

## 2019-09-07 ENCOUNTER — Other Ambulatory Visit: Payer: Self-pay

## 2019-09-07 ENCOUNTER — Ambulatory Visit (INDEPENDENT_AMBULATORY_CARE_PROVIDER_SITE_OTHER): Payer: PPO | Admitting: Vascular Surgery

## 2019-09-07 ENCOUNTER — Encounter: Payer: Self-pay | Admitting: Vascular Surgery

## 2019-09-07 VITALS — BP 129/72 | HR 76 | Temp 97.3°F | Resp 16 | Ht 69.0 in | Wt 163.0 lb

## 2019-09-07 DIAGNOSIS — I714 Abdominal aortic aneurysm, without rupture, unspecified: Secondary | ICD-10-CM

## 2019-09-07 DIAGNOSIS — I998 Other disorder of circulatory system: Secondary | ICD-10-CM

## 2019-09-07 NOTE — Progress Notes (Signed)
Patient name: Juan Bass MRN: [id number] DOB: May 12, 1951 Sex: male  REASON FOR CONSULT: 5.5 cm abdominal aortic aneurysm  HPI: Juan Bass is a 69 y.o. male, with history of hypertension, DM, and ESRD that presents for evaluation of 5.5 cm abdominal aortic aneurysm.  Patient states that he had known about this aneurysm for some time.  He recently had a CT abdomen pelvis without contrast on 03/18/2019 and noted aneurysm is now 5.5 cm.  Last imaging in our system from 2015 showed that his aneurysm measured 3.6 cm.  Patient states that he is still smoking at least a pack a day.  He denies any previous surgical history in his abdomen.  He was evaluated for transplant given his end-stage renal disease and was not a candidate due to his aneurysm and then referred back to [facility] for further evaluation by nephrology.  Patient is well-known to our practice and has had a left brachiocephalic AV fistula by Dr. Trula Slade for access and I have performed fistulogram and intervention on this.  He has also had his right iliac stenting in the setting of remote claudication.  He reports no issues with his legs at this time. No abdominal or back pain.  Past Medical History:  Diagnosis Date  . AAA (abdominal aortic aneurysm) ([place])   . Chronic kidney disease   . Diabetes mellitus without complication ([place])   . Hypertension     Past Surgical History:  Procedure Laterality Date  . A/V FISTULAGRAM Left 10/14/2018   Procedure: A/V FISTULAGRAM;  Surgeon: Marty Heck, MD;  Location: [facility];  Service: Cardiovascular;  Laterality: Left;  arm  . ABDOMINAL AORTAGRAM N/A 02/22/2014   Procedure: ABDOMINAL Maxcine Ham;  Surgeon: Serafina Mitchell, MD;  Location: [facility] CATH [facility];  Service: Cardiovascular;  Laterality: N/A;  . AV FISTULA PLACEMENT Left 08/06/2018   Procedure: ARTERIOVENOUS (AV) FISTULA CREATION;  Surgeon: Serafina Mitchell, MD;  Location: [facility] [facility];  Service: Vascular;  Laterality: Left;  .  BACK SURGERY    . PERIPHERAL VASCULAR BALLOON ANGIOPLASTY Left 10/14/2018   Procedure: PERIPHERAL VASCULAR BALLOON ANGIOPLASTY;  Surgeon: Marty Heck, MD;  Location: [place] [facility];  Service: Cardiovascular;  Laterality: Left;  ARM FISTULA  . SPINE SURGERY      Family History  Problem Relation Age of Onset  . Hypertension Mother   . Deep vein thrombosis Father   . Hypertension Father   . Hypertension Sister   . Hypertension Brother     SOCIAL HISTORY: Social History   Socioeconomic History  . Marital status: Married    Spouse name: Not on file  . Number of children: Not on file  . Years of education: Not on file  . Highest education level: Not on file  Occupational History  . Not on file  Tobacco Use  . Smoking status: Current Every Day Smoker    Packs/day: 0.50    Years: 40.00    Pack years: 20.00    Types: Cigarettes    Last attempt to quit: 07/24/2018    Years since quitting: 1.1  . Smokeless tobacco: Never Used  Substance and Sexual Activity  . Alcohol use: Yes    Alcohol/week: 7.0 standard drinks    Types: 7 Cans of beer per week    Comment: 1 can of beer daily  . Drug use: No  . Sexual activity: Not on file  Other Topics Concern  . Not on file  Social History Narrative  .  Not on file   Social Determinants of Health   Financial Resource Strain: Low Risk   . Difficulty of Paying Living Expenses: Not very hard  Food Insecurity: No Food Insecurity  . Worried About Charity fundraiser in the Last Year: Never true  . Ran Out of Food in the Last Year: Never true  Transportation Needs: No Transportation Needs  . Lack of Transportation (Medical): No  . Lack of Transportation (Non-Medical): No  Physical Activity: Insufficiently Active  . Days of Exercise per Week: 3 days  . Minutes of Exercise per Session: 30 min  Stress: No Stress Concern Present  . Feeling of Stress : Only a little  Social Connections: Slightly Isolated  . Frequency of  Communication with Friends and Family: More than three times a week  . Frequency of Social Gatherings with Friends and Family: More than three times a week  . Attends Religious Services: 1 to 4 times per year  . Active Member of Clubs or Organizations: No  . Attends Archivist Meetings: Never  . Marital Status: Married  Human resources officer Violence: Not At Risk  . Fear of Current or Ex-Partner: No  . Emotionally Abused: No  . Physically Abused: No  . Sexually Abused: No    No Known Allergies  Current Outpatient Medications  Medication Sig Dispense Refill  . acetaminophen (TYLENOL) 325 MG tablet Take 2 tablets (650 mg total) by mouth every 6 (six) hours as needed for mild pain (or Fever >/= 101). 12 tablet 0  . amLODipine (NORVASC) 10 MG tablet Take 1 tablet (10 mg total) by mouth daily. 30 tablet 3  . aspirin EC 81 MG tablet Take 1 tablet (81 mg total) by mouth daily with breakfast. 30 tablet 2  . atorvastatin (LIPITOR) 40 MG tablet Take 1 tablet (40 mg total) by mouth daily. 30 tablet 2  . Cholecalciferol (VITAMIN D3) 50 MCG (2000 UT) TABS Take 2,000 Units by mouth 4 (four) times a week.    Marland Kitchen glipiZIDE (GLUCOTROL XL) 2.5 MG 24 hr tablet Take 1 tablet (2.5 mg total) by mouth daily with breakfast. 60 tablet 2  . hydrALAZINE (APRESOLINE) 25 MG tablet Take 1 tablet (25 mg total) by mouth 3 (three) times daily. For BP 90 tablet 3  . labetalol (NORMODYNE) 200 MG tablet Take 2 tablets (400 mg total) by mouth 2 (two) times daily. 120 tablet 3  . levothyroxine (SYNTHROID) 50 MCG tablet Take 1 tablet (50 mcg total) by mouth daily before breakfast. 30 tablet 3  . ondansetron (ZOFRAN ODT) 4 MG disintegrating tablet Take 1 tablet (4 mg total) by mouth every 8 (eight) hours as needed for nausea or vomiting. 20 tablet 0   No current facility-administered medications for this visit.    REVIEW OF SYSTEMS:  [X]  denotes positive finding, [ ]  denotes negative finding Cardiac  Comments:  Chest  pain or chest pressure:    Shortness of breath upon exertion:    Short of breath when lying flat:    Irregular heart rhythm:        Vascular    Pain in calf, thigh, or hip brought on by ambulation:    Pain in feet at night that wakes you up from your sleep:     Blood clot in your veins:    Leg swelling:         Pulmonary    Oxygen at home:    Productive cough:     Wheezing:  Neurologic    Sudden weakness in arms or legs:     Sudden numbness in arms or legs:     Sudden onset of difficulty speaking or slurred speech:    Temporary loss of vision in one eye:     Problems with dizziness:         Gastrointestinal    Blood in stool:     Vomited blood:         Genitourinary    Burning when urinating:     Blood in urine:        Psychiatric    Major depression:         Hematologic    Bleeding problems:    Problems with blood clotting too easily:        Skin    Rashes or ulcers:        Constitutional    Fever or chills:      PHYSICAL EXAM: Vitals:   09/07/19 0948  BP: 129/72  Pulse: 76  Resp: 16  Temp: (!) 97.3 F (36.3 C)  TempSrc: Temporal  Weight: 163 lb (73.9 kg)  Height: 5\' 9"  (1.753 m)    GENERAL: The patient is a well-nourished male, in no acute distress. The vital signs are documented above. CARDIAC: There is a regular rate and rhythm.  VASCULAR:  Left brachiocephalic AVF with good thrill Palpable femoral pulses bilaterally PULMONARY: There is good air exchange bilaterally without wheezing or rales. ABDOMEN: Soft and non-tender with normal pitched bowel sounds.  Umbilical hernia No pain with palpation of aneurysm. MUSCULOSKELETAL: There are no major deformities or cyanosis. NEUROLOGIC: No focal weakness or paresthesias are detected. SKIN: There are no ulcers or rashes noted. PSYCHIATRIC: The patient has a normal affect.  DATA:   I independently reviewed his CT abdomen pelvis without contrast from 03/18/2019 and on my review I agree he has a  5.5 cm infrarenal abdominal aortic aneurysm.  It does appear that in the neck of the aneurysm he has several small penetrating aortic ulcers.  His access looks very questionable given very diseased left iliac and a stented right iliac.  Unable to completely evaluate iliac disease due to no contrast.  Assessment/Plan:  69 year old male with end-stage renal disease, diabetes and hypertension that presents with asymptomatic 5.5 cm abdominal aortic aneurysm.  I discussed with him in detail that certainly given the size of his aneurysm we should seriously evaluate options for repair.  I have recommended a CTA abdomen pelvis to further evaluate whether he would be a candidate for stent graft.  I am concerned about his access given very diseased iliacs on the left as well as a previous iliac stent on the right and unable to evaluate fully without contrast on his previous CT scan. Also has several small PAU's in neck, but given ESRD could cover his renals if needed with endograft.  I will have him come back and see me after the CTA is complete and will discuss whether he has a stent graft option versus would need an open repair.  I did instruct him to bring his sister back to the next appointment so we can discuss in detail.   Marty Heck, MD [facility] Office: [phone]

## 2019-09-16 ENCOUNTER — Ambulatory Visit
Admission: RE | Admit: 2019-09-16 | Discharge: 2019-09-16 | Disposition: A | Discharge status: Home / Self Care | Payer: PPO | Source: Ambulatory Visit | Attending: Vascular Surgery | Admitting: Vascular Surgery

## 2019-09-16 DIAGNOSIS — I998 Other disorder of circulatory system: Secondary | ICD-10-CM

## 2019-09-16 DIAGNOSIS — I714 Abdominal aortic aneurysm, without rupture: Secondary | ICD-10-CM | POA: Diagnosis not present

## 2019-09-16 MED ORDER — IOPAMIDOL (ISOVUE-370) INJECTION 76%
75.0000 mL | Freq: Once | INTRAVENOUS | Status: AC | PRN
  Administered 2019-09-16: 75 mL via INTRAVENOUS

## 2019-09-21 ENCOUNTER — Ambulatory Visit (INDEPENDENT_AMBULATORY_CARE_PROVIDER_SITE_OTHER): Payer: PPO | Admitting: Vascular Surgery

## 2019-09-21 ENCOUNTER — Other Ambulatory Visit: Payer: Self-pay

## 2019-09-21 ENCOUNTER — Encounter: Payer: Self-pay | Admitting: Vascular Surgery

## 2019-09-21 VITALS — BP 128/72 | HR 81 | Temp 97.3°F | Resp 16 | Ht 70.0 in | Wt 163.0 lb

## 2019-09-21 DIAGNOSIS — I714 Abdominal aortic aneurysm, without rupture, unspecified: Secondary | ICD-10-CM

## 2019-09-21 NOTE — Progress Notes (Signed)
Patient name: Juan Bass MRN: [id number] DOB: 15-Feb-1951 Sex: male  REASON FOR CONSULT: F/U after CTA abdomen/pelvis to discuss repair of abdominal aortic aneurysm  HPI: Juan Bass is a 69 y.o. male, with history of hypertension, DM, and ESRD (dialysis M/W/F) that presents to discuss 5.5 cm AAA after CTA.  As previously noted, he had a CT abdomen pelvis without contrast on 03/18/2019 and this noted aneurysm was 5.5 cm.  No abdominal or back pain.  Still smoking at least a pack a day.  He denies any previous surgical history in his abdomen.  He was evaluated for transplant given his end-stage renal disease and was not a candidate due to his aneurysm and then referred back to [facility] for further evaluation by nephrology.    Patient is well-known to our practice and has had a left brachiocephalic AV fistula by Dr. Trula Slade for access and I have performed fistulogram and intervention on this.  He has also had his right iliac stenting in the setting of remote claudication.    I sent him for contrasted study with CTA to further evaluate options for repair.  Past Medical History:  Diagnosis Date  . AAA (abdominal aortic aneurysm) ([place])   . Chronic kidney disease   . Diabetes mellitus without complication ([place])   . Hypertension     Past Surgical History:  Procedure Laterality Date  . A/V FISTULAGRAM Left 10/14/2018   Procedure: A/V FISTULAGRAM;  Surgeon: Juan Heck, MD;  Location: [place] [facility];  Service: Cardiovascular;  Laterality: Left;  arm  . ABDOMINAL AORTAGRAM N/A 02/22/2014   Procedure: ABDOMINAL Maxcine [place];  Surgeon: Serafina Mitchell, MD;  Location: [facility] CATH LAB;  Service: Cardiovascular;  Laterality: N/A;  . AV FISTULA PLACEMENT Left 08/06/2018   Procedure: ARTERIOVENOUS (AV) FISTULA CREATION;  Surgeon: Serafina Mitchell, MD;  Location: [facility] [place];  Service: Vascular;  Laterality: Left;  . BACK SURGERY    . PERIPHERAL VASCULAR BALLOON ANGIOPLASTY Left 10/14/2018   Procedure: PERIPHERAL VASCULAR BALLOON ANGIOPLASTY;  Surgeon: Juan Heck, MD;  Location: [place] [facility];  Service: Cardiovascular;  Laterality: Left;  ARM FISTULA  . SPINE SURGERY      Family History  Problem Relation Age of Onset  . Hypertension Mother   . Deep vein thrombosis Father   . Hypertension Father   . Hypertension Sister   . Hypertension Brother     SOCIAL HISTORY: Social History   Socioeconomic History  . Marital status: Married    Spouse name: Not on file  . Number of children: Not on file  . Years of education: Not on file  . Highest education level: Not on file  Occupational History  . Not on file  Tobacco Use  . Smoking status: Current Every Day Smoker    Packs/day: 0.50    Years: 40.00    Pack years: 20.00    Types: Cigarettes    Last attempt to quit: 07/24/2018    Years since quitting: 1.1  . Smokeless tobacco: Never Used  Substance and Sexual Activity  . Alcohol use: Yes    Alcohol/week: 7.0 standard drinks    Types: 7 Cans of beer per week    Comment: 1 can of beer daily  . Drug use: No  . Sexual activity: Not on file  Other Topics Concern  . Not on file  Social History Narrative  . Not on file   Social Determinants of Health   Financial Resource Strain:  Low Risk   . Difficulty of Paying Living Expenses: Not very hard  Food Insecurity: No Food Insecurity  . Worried About Charity fundraiser in the Last Year: Never true  . Ran Out of Food in the Last Year: Never true  Transportation Needs: No Transportation Needs  . Lack of Transportation (Medical): No  . Lack of Transportation (Non-Medical): No  Physical Activity: Insufficiently Active  . Days of Exercise per Week: 3 days  . Minutes of Exercise per Session: 30 min  Stress: No Stress Concern Present  . Feeling of Stress : Only a little  Social Connections: Slightly Isolated  . Frequency of Communication with Friends and Family: More than three times a week  . Frequency of  Social Gatherings with Friends and Family: More than three times a week  . Attends Religious Services: 1 to 4 times per year  . Active Member of Clubs or Organizations: No  . Attends Archivist Meetings: Never  . Marital Status: Married  Human resources officer Violence: Not At Risk  . Fear of Current or Ex-Partner: No  . Emotionally Abused: No  . Physically Abused: No  . Sexually Abused: No    No Known Allergies  Current Outpatient Medications  Medication Sig Dispense Refill  . acetaminophen (TYLENOL) 325 MG tablet Take 2 tablets (650 mg total) by mouth every 6 (six) hours as needed for mild pain (or Fever >/= 101). 12 tablet 0  . amLODipine (NORVASC) 10 MG tablet Take 1 tablet (10 mg total) by mouth daily. 30 tablet 3  . aspirin EC 81 MG tablet Take 1 tablet (81 mg total) by mouth daily with breakfast. 30 tablet 2  . atorvastatin (LIPITOR) 40 MG tablet Take 1 tablet (40 mg total) by mouth daily. 30 tablet 2  . Cholecalciferol (VITAMIN D3) 50 MCG (2000 UT) TABS Take 2,000 Units by mouth 4 (four) times a week.    Marland Kitchen glipiZIDE (GLUCOTROL XL) 2.5 MG 24 hr tablet Take 1 tablet (2.5 mg total) by mouth daily with breakfast. 60 tablet 2  . hydrALAZINE (APRESOLINE) 25 MG tablet Take 1 tablet (25 mg total) by mouth 3 (three) times daily. For BP 90 tablet 3  . labetalol (NORMODYNE) 200 MG tablet Take 2 tablets (400 mg total) by mouth 2 (two) times daily. 120 tablet 3  . levothyroxine (SYNTHROID) 50 MCG tablet Take 1 tablet (50 mcg total) by mouth daily before breakfast. 30 tablet 3  . ondansetron (ZOFRAN ODT) 4 MG disintegrating tablet Take 1 tablet (4 mg total) by mouth every 8 (eight) hours as needed for nausea or vomiting. 20 tablet 0   No current facility-administered medications for this visit.    REVIEW OF SYSTEMS:  [X]  denotes positive finding, [ ]  denotes negative finding Cardiac  Comments:  Chest pain or chest pressure:    Shortness of breath upon exertion:    Short of breath  when lying flat:    Irregular heart rhythm:        Vascular    Pain in calf, thigh, or hip brought on by ambulation:    Pain in feet at night that wakes you up from your sleep:     Blood clot in your veins:    Leg swelling:         Pulmonary    Oxygen at home:    Productive cough:     Wheezing:         Neurologic    Sudden weakness in arms  or legs:     Sudden numbness in arms or legs:     Sudden onset of difficulty speaking or slurred speech:    Temporary loss of vision in one eye:     Problems with dizziness:         Gastrointestinal    Blood in stool:     Vomited blood:         Genitourinary    Burning when urinating:     Blood in urine:        Psychiatric    Major depression:         Hematologic    Bleeding problems:    Problems with blood clotting too easily:        Skin    Rashes or ulcers:        Constitutional    Fever or chills:      PHYSICAL EXAM: Vitals:   09/21/19 1512  BP: 128/72  Pulse: 81  Resp: 16  Temp: (!) 97.3 F (36.3 C)  TempSrc: Temporal  SpO2: 99%  Weight: 163 lb (73.9 kg)  Height: 5\' 10"  (1.778 m)    GENERAL: The patient is a well-nourished male, in no acute distress. The vital signs are documented above. CARDIAC: There is a regular rate and rhythm.  VASCULAR:  Left brachiocephalic AVF with good thrill Palpable femoral pulses bilaterally PULMONARY: There is good air exchange bilaterally without wheezing or rales. ABDOMEN: Soft and non-tender with normal pitched bowel sounds.  Umbilical hernia No pain with palpation of aneurysm. MUSCULOSKELETAL: There are no major deformities or cyanosis. NEUROLOGIC: No focal weakness or paresthesias are detected. SKIN: There are no ulcers or rashes noted. PSYCHIATRIC: The patient has a normal affect.  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  Essentially the aorta is degenerated up to the level of the SMA and right  renal artery.  Left renal comes off posterior and superior to SMA.  His right iliac stent looks widely patent.  Assessment/Plan:  69 year old male with end-stage renal disease, diabetes and hypertension that was initially evaluated for asymptomatic 5.5 cm abdominal aortic aneurysm.  I discussed with him and his sister in detail that after review of his CTA to evaluate options for repair this shows that his aneurysm has increased to 6.1 cm.  This is a juxtarenal aneurysm and degenerated up to the level of his SMA.  I do not see any options for endovascular repair and we even evaluated covering his renal arteries with stent graft given his end-stage renal.  I think he is likely going to need open repair with tube graft and and I did quote him and his sister 33% chance of major complication.  Ultimately patient and his sister state they need some time to think about this.  They would like to proceed with getting an echocardiogram and cardiac clearance.  He denies any chest pain or shortness of breath and has no cardiologist at this time.  I will follow-up with him later this week or early next week by phone to see what he has decided about proceeding with repair.  I did quote them a 15% rupture risk/year at this time.  He understands this would be high cardiac risk operation and difficult repair given juxtarenal location.   Juan Heck, MD [facility] Office: [phone]

## 2019-09-22 ENCOUNTER — Other Ambulatory Visit: Payer: Self-pay

## 2019-09-22 DIAGNOSIS — I6529 Occlusion and stenosis of unspecified carotid artery: Secondary | ICD-10-CM

## 2019-09-23 ENCOUNTER — Ambulatory Visit: Payer: PPO | Admitting: Cardiology

## 2019-09-28 ENCOUNTER — Ambulatory Visit (HOSPITAL_COMMUNITY)
Admission: RE | Admit: 2019-09-28 | Discharge: 2019-09-28 | Disposition: A | Discharge status: Home / Self Care | Payer: PPO | Source: Ambulatory Visit | Attending: Vascular Surgery | Admitting: Vascular Surgery

## 2019-09-28 ENCOUNTER — Other Ambulatory Visit: Payer: Self-pay

## 2019-09-28 ENCOUNTER — Other Ambulatory Visit: Payer: Self-pay | Admitting: Vascular Surgery

## 2019-09-28 DIAGNOSIS — E1122 Type 2 diabetes mellitus with diabetic chronic kidney disease: Secondary | ICD-10-CM | POA: Insufficient documentation

## 2019-09-28 DIAGNOSIS — I120 Hypertensive chronic kidney disease with stage 5 chronic kidney disease or end stage renal disease: Secondary | ICD-10-CM | POA: Diagnosis not present

## 2019-09-28 DIAGNOSIS — I6529 Occlusion and stenosis of unspecified carotid artery: Secondary | ICD-10-CM | POA: Diagnosis not present

## 2019-09-28 DIAGNOSIS — I714 Abdominal aortic aneurysm, without rupture: Secondary | ICD-10-CM | POA: Insufficient documentation

## 2019-09-28 DIAGNOSIS — N186 End stage renal disease: Secondary | ICD-10-CM | POA: Insufficient documentation

## 2019-09-28 NOTE — Progress Notes (Signed)
  Echocardiogram 2D Echocardiogram has been performed.  Dustin Flock 09/28/2019, 1:14 PM

## 2019-09-29 ENCOUNTER — Other Ambulatory Visit (HOSPITAL_COMMUNITY): Payer: PPO

## 2019-10-05 ENCOUNTER — Ambulatory Visit: Payer: PPO | Attending: Internal Medicine

## 2019-10-05 DIAGNOSIS — Z23 Encounter for immunization: Secondary | ICD-10-CM

## 2019-10-05 NOTE — Progress Notes (Signed)
   Covid-19 Vaccination Clinic  Name:  Juan Bass    MRN: [id number] DOB: 11/09/1950  10/05/2019  [name] was observed post Covid-19 immunization for 15 minutes without incident. He was provided with Vaccine Information Sheet and instruction to access the V-Safe system.   [name] was instructed to call 911 with any severe reactions post vaccine: Marland Kitchen Difficulty breathing  . Swelling of face and throat  . A fast heartbeat  . A bad rash all over body  . Dizziness and weakness   Immunizations Administered    Name Date Dose VIS Date Route   Pfizer COVID-19 Vaccine 10/05/2019  9:50 AM 0.3 mL 06/18/2019 Intramuscular   Manufacturer: [place]   Lot: [id number]   [place]: [id number]

## 2019-10-06 DIAGNOSIS — N186 End stage renal disease: Secondary | ICD-10-CM | POA: Diagnosis not present

## 2019-10-06 DIAGNOSIS — Z992 Dependence on renal dialysis: Secondary | ICD-10-CM | POA: Diagnosis not present

## 2019-10-08 ENCOUNTER — Ambulatory Visit: Payer: PPO | Admitting: Cardiovascular Disease

## 2019-10-08 DIAGNOSIS — N2581 Secondary hyperparathyroidism of renal origin: Secondary | ICD-10-CM | POA: Diagnosis not present

## 2019-10-08 DIAGNOSIS — N186 End stage renal disease: Secondary | ICD-10-CM | POA: Diagnosis not present

## 2019-10-08 DIAGNOSIS — D631 Anemia in chronic kidney disease: Secondary | ICD-10-CM | POA: Diagnosis not present

## 2019-10-08 DIAGNOSIS — D509 Iron deficiency anemia, unspecified: Secondary | ICD-10-CM | POA: Diagnosis not present

## 2019-10-08 DIAGNOSIS — Z992 Dependence on renal dialysis: Secondary | ICD-10-CM | POA: Diagnosis not present

## 2019-10-13 ENCOUNTER — Other Ambulatory Visit: Payer: Self-pay

## 2019-10-19 ENCOUNTER — Other Ambulatory Visit: Payer: Self-pay | Admitting: Internal Medicine

## 2019-10-19 ENCOUNTER — Other Ambulatory Visit (HOSPITAL_COMMUNITY): Payer: Self-pay | Admitting: Internal Medicine

## 2019-10-19 DIAGNOSIS — R0989 Other specified symptoms and signs involving the circulatory and respiratory systems: Secondary | ICD-10-CM

## 2019-10-19 DIAGNOSIS — E1165 Type 2 diabetes mellitus with hyperglycemia: Secondary | ICD-10-CM | POA: Diagnosis not present

## 2019-10-19 DIAGNOSIS — E7849 Other hyperlipidemia: Secondary | ICD-10-CM | POA: Diagnosis not present

## 2019-10-19 DIAGNOSIS — E1129 Type 2 diabetes mellitus with other diabetic kidney complication: Secondary | ICD-10-CM | POA: Diagnosis not present

## 2019-10-19 DIAGNOSIS — I10 Essential (primary) hypertension: Secondary | ICD-10-CM | POA: Diagnosis not present

## 2019-10-19 DIAGNOSIS — Z0001 Encounter for general adult medical examination with abnormal findings: Secondary | ICD-10-CM | POA: Diagnosis not present

## 2019-10-19 DIAGNOSIS — Z6824 Body mass index (BMI) 24.0-24.9, adult: Secondary | ICD-10-CM | POA: Diagnosis not present

## 2019-10-19 DIAGNOSIS — Z1389 Encounter for screening for other disorder: Secondary | ICD-10-CM | POA: Diagnosis not present

## 2019-10-25 ENCOUNTER — Encounter: Payer: Self-pay | Admitting: *Deleted

## 2019-10-26 ENCOUNTER — Ambulatory Visit: Payer: PPO | Admitting: Cardiology

## 2019-10-27 ENCOUNTER — Encounter: Payer: Self-pay | Admitting: *Deleted

## 2019-10-27 ENCOUNTER — Ambulatory Visit (HOSPITAL_COMMUNITY)
Admission: RE | Admit: 2019-10-27 | Discharge: 2019-10-27 | Disposition: A | Discharge status: Home / Self Care | Payer: PPO | Source: Ambulatory Visit | Attending: Internal Medicine | Admitting: Internal Medicine

## 2019-10-27 ENCOUNTER — Other Ambulatory Visit: Payer: Self-pay

## 2019-10-27 DIAGNOSIS — R0989 Other specified symptoms and signs involving the circulatory and respiratory systems: Secondary | ICD-10-CM

## 2019-10-27 DIAGNOSIS — I6523 Occlusion and stenosis of bilateral carotid arteries: Secondary | ICD-10-CM | POA: Diagnosis not present

## 2019-10-28 ENCOUNTER — Ambulatory Visit (INDEPENDENT_AMBULATORY_CARE_PROVIDER_SITE_OTHER): Payer: PPO | Admitting: Cardiology

## 2019-10-28 ENCOUNTER — Encounter: Payer: Self-pay | Admitting: Cardiology

## 2019-10-28 DIAGNOSIS — I10 Essential (primary) hypertension: Secondary | ICD-10-CM

## 2019-10-28 NOTE — Patient Instructions (Signed)
Your physician recommends that you schedule a follow-up appointment in: [place]  Your physician recommends that you continue on your current medications as directed. Please refer to the Current Medication list given to you today.  Thank you for choosing [facility]!!

## 2019-10-28 NOTE — Progress Notes (Signed)
Clinical Summary Juan Bass is a 69 y.o.male seen today as a new consult, referred by Dr Carlis Abbott for preoperative evaluation  1. Preoperative evaluation - 09/2019 CTA Abd/Pelvis: infrarenal AAA 6.1 cm. Being considered for EVAR - 09/2019 echo LVEF 60-65%, mild LVH, grade I DDX, normal RV,   Does yard work regularly. Weed eating about 30 minutes without troubles.  - can walk up 2 flights of stairs, 4+ blocks on flat ground with troubles.  - no SOB or DOE, no recent chest pains   2. ESRD - has left brachiocephalic fistula - he is on ESRD starting this year Past Medical History:  Diagnosis Date  . AAA (abdominal aortic aneurysm) ([place])   . Chronic kidney disease   . Diabetes mellitus without complication ([place])   . Hypertension      No Known Allergies   Current Outpatient Medications  Medication Sig Dispense Refill  . acetaminophen (TYLENOL) 325 MG tablet Take 2 tablets (650 mg total) by mouth every 6 (six) hours as needed for mild pain (or Fever >/= 101). 12 tablet 0  . amLODipine (NORVASC) 10 MG tablet Take 1 tablet (10 mg total) by mouth daily. (Patient not taking: Reported on 10/21/2019) 30 tablet 3  . aspirin EC 81 MG tablet Take 1 tablet (81 mg total) by mouth daily with breakfast. 30 tablet 2  . atorvastatin (LIPITOR) 40 MG tablet Take 1 tablet (40 mg total) by mouth daily. 30 tablet 2  . Cholecalciferol (VITAMIN D3) 50 MCG (2000 UT) TABS Take 2,000 Units by mouth 4 (four) times a week.    Marland Kitchen glipiZIDE (GLUCOTROL XL) 2.5 MG 24 hr tablet Take 1 tablet (2.5 mg total) by mouth daily with breakfast. 60 tablet 2  . hydrALAZINE (APRESOLINE) 25 MG tablet Take 1 tablet (25 mg total) by mouth 3 (three) times daily. For BP (Patient not taking: Reported on 10/21/2019) 90 tablet 3  . labetalol (NORMODYNE) 200 MG tablet Take 2 tablets (400 mg total) by mouth 2 (two) times daily. (Patient taking differently: Take 200 mg by mouth 2 (two) times daily. ) 120 tablet 3  . levothyroxine  (SYNTHROID) 50 MCG tablet Take 1 tablet (50 mcg total) by mouth daily before breakfast. 30 tablet 3  . ondansetron (ZOFRAN ODT) 4 MG disintegrating tablet Take 1 tablet (4 mg total) by mouth every 8 (eight) hours as needed for nausea or vomiting. (Patient not taking: Reported on 10/21/2019) 20 tablet 0   No current facility-administered medications for this visit.     Past Surgical History:  Procedure Laterality Date  . A/V FISTULAGRAM Left 10/14/2018   Procedure: A/V FISTULAGRAM;  Surgeon: Marty Heck, MD;  Location: [place] [facility];  Service: Cardiovascular;  Laterality: Left;  arm  . ABDOMINAL AORTAGRAM N/A 02/22/2014   Procedure: ABDOMINAL Maxcine Ham;  Surgeon: Serafina Mitchell, MD;  Location: [facility] CATH LAB;  Service: Cardiovascular;  Laterality: N/A;  . AV FISTULA PLACEMENT Left 08/06/2018   Procedure: ARTERIOVENOUS (AV) FISTULA CREATION;  Surgeon: Serafina Mitchell, MD;  Location: [place];  Service: Vascular;  Laterality: Left;  . BACK SURGERY    . PERIPHERAL VASCULAR BALLOON ANGIOPLASTY Left 10/14/2018   Procedure: PERIPHERAL VASCULAR BALLOON ANGIOPLASTY;  Surgeon: Marty Heck, MD;  Location: [place] [facility];  Service: Cardiovascular;  Laterality: Left;  ARM FISTULA  . SPINE SURGERY       No Known Allergies    Family History  Problem Relation Age of Onset  . Hypertension  Mother   . Deep vein thrombosis Father   . Hypertension Father   . Hypertension Sister   . Hypertension Brother      Social History Juan Bass reports that he has been smoking cigarettes. He has a 20.00 pack-year smoking history. He has never used smokeless tobacco. Juan Bass reports current alcohol use of about 7.0 standard drinks of alcohol per week.   Review of Systems CONSTITUTIONAL: No weight loss, fever, chills, weakness or fatigue.  HEENT: Eyes: No visual loss, blurred vision, double vision or yellow sclerae.No hearing loss, sneezing, congestion, runny nose or sore throat.    SKIN: No rash or itching.  CARDIOVASCULAR: per hpi RESPIRATORY: No shortness of breath, cough or sputum.  GASTROINTESTINAL: No anorexia, nausea, vomiting or diarrhea. No abdominal pain or blood.  GENITOURINARY: No burning on urination, no polyuria NEUROLOGICAL: No headache, dizziness, syncope, paralysis, ataxia, numbness or tingling in the extremities. No change in bowel or bladder control.  MUSCULOSKELETAL: No muscle, back pain, joint pain or stiffness.  LYMPHATICS: No enlarged nodes. No history of splenectomy.  PSYCHIATRIC: No history of depression or anxiety.  ENDOCRINOLOGIC: No reports of sweating, cold or heat intolerance. No polyuria or polydipsia.  Marland Kitchen   Physical Examination Today's Vitals   10/28/19 1311  BP: (!) 160/64  Pulse: 80  SpO2: 96%  Weight: 164 lb 9.6 oz (74.7 kg)  Height: 5\' 9"  (1.753 m)   Body mass index is 24.31 kg/m.  Gen: resting comfortably, no acute distress HEENT: no scleral icterus, pupils equal round and reactive, no palptable cervical adenopathy,  CV: RRR, no m/r/g, no jvd Resp: Clear to auscultation bilaterally GI: abdomen is soft, non-tender, non-distended, normal bowel sounds, no hepatosplenomegaly MSK: extremities are warm, no edema.  Skin: warm, no rash Neuro:  no focal deficits Psych: appropriate affect   Diagnostic Studies  09/2019 echo IMPRESSIONS    1. Left ventricular ejection fraction, by estimation, is 60 to 65%. The  left ventricle has normal function. The left ventricle has no regional  wall motion abnormalities. There is mild left ventricular hypertrophy.  Left ventricular diastolic parameters  are consistent with Grade I diastolic dysfunction (impaired relaxation).  2. Right ventricular systolic function is normal. The right ventricular  size is normal.  3. The mitral valve is normal in structure. No evidence of mitral valve  regurgitation. No evidence of mitral stenosis.  4. The aortic valve is normal in structure.  Aortic valve regurgitation is  not visualized. No aortic stenosis is present.  5. The inferior vena cava is normal in size with greater than 50%  respiratory variability, suggesting right atrial pressure of 3 mmHg.   10/2019 carotid US IMPRESSION: Color duplex indicates moderate heterogeneous and calcified plaque, with no hemodynamically significant stenosis by duplex criteria in the extracranial cerebrovascular circulation.  Systolic reversal of the left vertebral waveform indicating developing subclavian stenosis. Correlation with upper extremity blood pressures may be useful.   Assessment and Plan  1. Preoperative cardiac evaluation - being considered for EVAR for AAA - no acute active cardiac conditions - tolerates greater than 4 METs regularly by history without limitation - recommend proceeding with surgery as planned, there is no indication for cardiac testing at this time.  - EKG today shows NSR, no acute ischemic changes     Arnoldo Lenis, M.D.

## 2019-11-03 NOTE — Progress Notes (Signed)
[id number] - [place], [place] [address] [place] [address] Phone: [phone] Fax: [phone]      Your procedure is scheduled on Monday May 3  Report to [facility] Main Entrance "A" at 0530 A.M., and check in at the Admitting office.  Call this number if you have problems the morning of surgery:  [phone]  Call [phone] if you have any questions prior to your surgery date Monday-Friday 8am-4pm    Remember:  Do not eat or drink after midnight the night before your surgery     Take these medicines the morning of surgery with A SIP OF WATER  acetaminophen (TYLENOL)  If needed labetalol (NORMODYNE)  levothyroxine (SYNTHROID)  As of today, STOP taking any Aspirin (unless otherwise instructed by your surgeon) and Aspirin containing products, Aleve, Naproxen, Ibuprofen, Motrin, Advil, Goody's, BC's, all herbal medications, fish oil, and all vitamins.   WHAT DO I DO ABOUT MY DIABETES MEDICATION?  Do NOT take the evening dose of glipiZIDE (GLUCOTROL before your surery . Do not take oral diabetes medicines (pills) the morning of surgery. glipiZIDE (GLUCOTROL   HOW TO MANAGE YOUR DIABETES BEFORE AND AFTER SURGERY  Why is it important to control my blood sugar before and after surgery? . Improving blood sugar levels before and after surgery helps healing and can limit problems. . A way of improving blood sugar control is eating a healthy diet by: o  Eating less sugar and carbohydrates o  Increasing activity/exercise o  Talking with your doctor about reaching your blood sugar goals . High blood sugars (greater than 180 mg/dL) can raise your risk of infections and slow your recovery, so you will need to focus on controlling your diabetes during the weeks before surgery. . Make sure that the doctor who takes care of your diabetes knows about your planned surgery including the date and  location.  How do I manage my blood sugar before surgery? . Check your blood sugar at least 4 times a day, starting 2 days before surgery, to make sure that the level is not too high or low. . Check your blood sugar the morning of your surgery when you wake up and every 2 hours until you get to the Short Stay unit. o If your blood sugar is less than 70 mg/dL, you will need to treat for low blood sugar: - Do not take insulin. - Treat a low blood sugar (less than 70 mg/dL) with  cup of clear juice (cranberry or apple), 4 glucose tablets, OR glucose gel. - Recheck blood sugar in 15 minutes after treatment (to make sure it is greater than 70 mg/dL). If your blood sugar is not greater than 70 mg/dL on recheck, call [phone] for further instructions. . Report your blood sugar to the short stay nurse when you get to Short Stay.  . If you are admitted to the hospital after surgery: o Your blood sugar will be checked by the staff and you will probably be given insulin after surgery (instead of oral diabetes medicines) to make sure you have good blood sugar levels. o The goal for blood sugar control after surgery is 80-180 mg/dL.                      Do not wear jewelry, make up, or nail polish  Do not wear lotions, powders, perfumes/colognes, or deodorant.            Do not shave 48 hours prior to surgery.  Men may shave face and neck.            Do not bring valuables to the hospital.            [facility] is not responsible for any belongings or valuables.  Do NOT Smoke (Tobacco/Vapping) or drink Alcohol 24 hours prior to your procedure If you use a CPAP at night, you may bring all equipment for your overnight stay.   Contacts, glasses, dentures or bridgework may not be worn into surgery.      For patients admitted to the hospital, discharge time will be determined by your treatment team.   Patients discharged the day of surgery will not be allowed to drive home, and someone  needs to stay with them for 24 hours.    Special instructions:   [place]- Preparing For Surgery  Before surgery, you can play an important role. Because skin is not sterile, your skin needs to be as free of germs as possible. You can reduce the number of germs on your skin by washing with CHG (chlorahexidine gluconate) Soap before surgery.  CHG is an antiseptic cleaner which kills germs and bonds with the skin to continue killing germs even after washing.    Oral Hygiene is also important to reduce your risk of infection.  Remember - BRUSH YOUR TEETH THE MORNING OF SURGERY WITH YOUR REGULAR TOOTHPASTE  Please do not use if you have an allergy to CHG or antibacterial soaps. If your skin becomes reddened/irritated stop using the CHG.  Do not shave (including legs and underarms) for at least 48 hours prior to first CHG shower. It is OK to shave your face.  Please follow these instructions carefully.   1. Shower the NIGHT BEFORE SURGERY and the MORNING OF SURGERY with CHG Soap.   2. If you chose to wash your hair, wash your hair first as usual with your normal shampoo.  3. After you shampoo, rinse your hair and body thoroughly to remove the shampoo.  4. Use CHG as you would any other liquid soap. You can apply CHG directly to the skin and wash gently with a scrungie or a clean washcloth.   5. Apply the CHG Soap to your body ONLY FROM THE NECK DOWN.  Do not use on open wounds or open sores. Avoid contact with your eyes, ears, mouth and genitals (private parts). Wash Face and genitals (private parts)  with your normal soap.   6. Wash thoroughly, paying special attention to the area where your surgery will be performed.  7. Thoroughly rinse your body with warm water from the neck down.  8. DO NOT shower/wash with your normal soap after using and rinsing off the CHG Soap.  9. Pat yourself dry with a CLEAN TOWEL.  10. Wear CLEAN PAJAMAS to bed the night before surgery, wear comfortable  clothes the morning of surgery  11. Place CLEAN SHEETS on your bed the night of your first shower and DO NOT SLEEP WITH PETS.   Day of Surgery:   Do not apply any deodorants/lotions.  Please wear clean clothes to the hospital/surgery center.   Remember to brush your teeth WITH YOUR REGULAR TOOTHPASTE.   Please read over the following fact sheets that you were given.

## 2019-11-04 ENCOUNTER — Encounter (HOSPITAL_COMMUNITY): Payer: Self-pay

## 2019-11-04 ENCOUNTER — Encounter (HOSPITAL_COMMUNITY)
Admission: RE | Admit: 2019-11-04 | Discharge: 2019-11-04 | Disposition: A | Discharge status: Home / Self Care | Payer: PPO | Source: Ambulatory Visit | Attending: Orthopedic Surgery | Admitting: Orthopedic Surgery

## 2019-11-04 ENCOUNTER — Other Ambulatory Visit: Payer: Self-pay

## 2019-11-04 ENCOUNTER — Other Ambulatory Visit (HOSPITAL_COMMUNITY)
Admission: RE | Admit: 2019-11-04 | Discharge: 2019-11-04 | Disposition: A | Discharge status: Home / Self Care | Payer: PPO | Source: Ambulatory Visit | Attending: Vascular Surgery | Admitting: Vascular Surgery

## 2019-11-04 DIAGNOSIS — F17200 Nicotine dependence, unspecified, uncomplicated: Secondary | ICD-10-CM | POA: Diagnosis not present

## 2019-11-04 DIAGNOSIS — Z992 Dependence on renal dialysis: Secondary | ICD-10-CM | POA: Insufficient documentation

## 2019-11-04 DIAGNOSIS — I714 Abdominal aortic aneurysm, without rupture: Secondary | ICD-10-CM | POA: Insufficient documentation

## 2019-11-04 DIAGNOSIS — I120 Hypertensive chronic kidney disease with stage 5 chronic kidney disease or end stage renal disease: Secondary | ICD-10-CM | POA: Diagnosis not present

## 2019-11-04 DIAGNOSIS — Z79899 Other long term (current) drug therapy: Secondary | ICD-10-CM | POA: Insufficient documentation

## 2019-11-04 DIAGNOSIS — Z20822 Contact with and (suspected) exposure to covid-19: Secondary | ICD-10-CM | POA: Insufficient documentation

## 2019-11-04 DIAGNOSIS — Z01818 Encounter for other preprocedural examination: Secondary | ICD-10-CM | POA: Diagnosis not present

## 2019-11-04 DIAGNOSIS — E039 Hypothyroidism, unspecified: Secondary | ICD-10-CM | POA: Insufficient documentation

## 2019-11-04 DIAGNOSIS — Z7984 Long term (current) use of oral hypoglycemic drugs: Secondary | ICD-10-CM | POA: Insufficient documentation

## 2019-11-04 DIAGNOSIS — E1122 Type 2 diabetes mellitus with diabetic chronic kidney disease: Secondary | ICD-10-CM | POA: Insufficient documentation

## 2019-11-04 DIAGNOSIS — Z7982 Long term (current) use of aspirin: Secondary | ICD-10-CM | POA: Insufficient documentation

## 2019-11-04 DIAGNOSIS — E1151 Type 2 diabetes mellitus with diabetic peripheral angiopathy without gangrene: Secondary | ICD-10-CM | POA: Insufficient documentation

## 2019-11-04 DIAGNOSIS — N186 End stage renal disease: Secondary | ICD-10-CM | POA: Diagnosis not present

## 2019-11-04 HISTORY — DX: Peripheral vascular disease, unspecified: I73.9

## 2019-11-04 HISTORY — DX: Hypothyroidism, unspecified: E03.9

## 2019-11-04 LAB — COMPREHENSIVE METABOLIC PANEL
ALT: 29 U/L (ref 0–44)
AST: 24 U/L (ref 15–41)
Albumin: 3.9 g/dL (ref 3.5–5.0)
Alkaline Phosphatase: 62 U/L (ref 38–126)
Anion gap: 12 (ref 5–15)
BUN: 41 mg/dL — ABNORMAL HIGH (ref 8–23)
CO2: 29 mmol/L (ref 22–32)
Calcium: 9.3 mg/dL (ref 8.9–10.3)
Chloride: 99 mmol/L (ref 98–111)
Creatinine, Ser: 8.17 mg/dL — ABNORMAL HIGH (ref 0.61–1.24)
GFR calc Af Amer: 7 mL/min — ABNORMAL LOW (ref >60)
GFR calc non Af Amer: 6 mL/min — ABNORMAL LOW (ref >60)
Glucose, Bld: 216 mg/dL — ABNORMAL HIGH (ref 70–99)
Potassium: 4.5 mmol/L (ref 3.5–5.1)
Sodium: 140 mmol/L (ref 135–145)
Total Bilirubin: 0.9 mg/dL (ref 0.3–1.2)
Total Protein: 6.6 g/dL (ref 6.5–8.1)

## 2019-11-04 LAB — URINALYSIS, ROUTINE W REFLEX MICROSCOPIC
Bacteria, UA: NONE SEEN
Bilirubin Urine: NEGATIVE
Glucose, UA: 150 mg/dL — AB
Hgb urine dipstick: NEGATIVE
Ketones, ur: NEGATIVE mg/dL
Leukocytes,Ua: NEGATIVE
Nitrite: NEGATIVE
Protein, ur: 100 mg/dL — AB
Specific Gravity, Urine: 1.014 (ref 1.005–1.030)
pH: 7 (ref 5.0–8.0)

## 2019-11-04 LAB — SURGICAL PCR SCREEN
MRSA, PCR: NEGATIVE
Staphylococcus aureus: NEGATIVE

## 2019-11-04 LAB — CBC
HCT: 32 % — ABNORMAL LOW (ref 39.0–52.0)
Hemoglobin: 10 g/dL — ABNORMAL LOW (ref 13.0–17.0)
MCH: 25.8 pg — ABNORMAL LOW (ref 26.0–34.0)
MCHC: 31.3 g/dL (ref 30.0–36.0)
MCV: 82.7 fL (ref 80.0–100.0)
Platelets: 202 10*3/uL (ref 150–400)
RBC: 3.87 MIL/uL — ABNORMAL LOW (ref 4.22–5.81)
RDW: 17.6 % — ABNORMAL HIGH (ref 11.5–15.5)
WBC: 8.6 10*3/uL (ref 4.0–10.5)
nRBC: 0 % (ref 0.0–0.2)

## 2019-11-04 LAB — HEMOGLOBIN A1C
Hgb A1c MFr Bld: 7.9 % — ABNORMAL HIGH (ref 4.8–5.6)
Mean Plasma Glucose: 180.03 mg/dL

## 2019-11-04 LAB — APTT: aPTT: 40 seconds — ABNORMAL HIGH (ref 24–36)

## 2019-11-04 LAB — PROTIME-INR
INR: 1 (ref 0.8–1.2)
Prothrombin Time: 13.2 seconds (ref 11.4–15.2)

## 2019-11-04 LAB — SARS CORONAVIRUS 2 (TAT 6-24 HRS): SARS Coronavirus 2: NEGATIVE

## 2019-11-04 LAB — ABO/RH: ABO/RH(D): AB POS

## 2019-11-04 LAB — GLUCOSE, CAPILLARY: Glucose-Capillary: 231 mg/dL — ABNORMAL HIGH (ref 70–99)

## 2019-11-04 NOTE — Progress Notes (Addendum)
PCP - Dr. Sharilyn Sites Cardiologist - denies  PPM/ICD - denies  Chest x-ray - N/A EKG - 10/28/2019 Stress Test - per patient "more than 5 years ago, it was normal" Patient does not remember the reason for having it done ECHO - 09/28/2019 Cardiac Cath - denies  Sleep Study - denies CPAP - N/A  DM: per patient, "do not check blood sugar"  Blood Thinner Instructions: N/A Aspirin Instructions: Larene Beach called from surgeon's office and stated patient to continue taking ASA as prescribed, no need to stop. Patient notified and verbalized understanding.  ERAS Protcol - No  COVID TEST-  Scheduled for today 11/04/2019. Patient verbalized understanding of self-quarantine instructions and the importance of wearing a mask at HD. Patient also verbalized understanding of where and when to go for appointment.  Anesthesia review: YES, cardiac clearance, HD patient (Tue, Thurs, Sat.)  Patient denies shortness of breath, fever, cough and chest pain at PAT appointment  All instructions explained to the patient, with a verbal understanding of the material. Patient agrees to go over the instructions while at home for a better understanding. Patient also instructed to self quarantine after being tested for COVID-19. The opportunity to ask questions was provided.

## 2019-11-05 ENCOUNTER — Telehealth: Payer: Self-pay | Admitting: Vascular Surgery

## 2019-11-05 ENCOUNTER — Encounter (HOSPITAL_COMMUNITY): Payer: Self-pay

## 2019-11-05 DIAGNOSIS — E1122 Type 2 diabetes mellitus with diabetic chronic kidney disease: Secondary | ICD-10-CM | POA: Diagnosis not present

## 2019-11-05 DIAGNOSIS — E039 Hypothyroidism, unspecified: Secondary | ICD-10-CM | POA: Diagnosis not present

## 2019-11-05 DIAGNOSIS — Z992 Dependence on renal dialysis: Secondary | ICD-10-CM | POA: Diagnosis not present

## 2019-11-05 DIAGNOSIS — N186 End stage renal disease: Secondary | ICD-10-CM | POA: Diagnosis not present

## 2019-11-05 DIAGNOSIS — I120 Hypertensive chronic kidney disease with stage 5 chronic kidney disease or end stage renal disease: Secondary | ICD-10-CM | POA: Diagnosis not present

## 2019-11-05 DIAGNOSIS — N185 Chronic kidney disease, stage 5: Secondary | ICD-10-CM | POA: Diagnosis not present

## 2019-11-05 NOTE — Telephone Encounter (Signed)
Called and updated Juan Bass about plan for surgery on Monday.  As previously discussed he has a very large complex abdominal aneurysm that is degenerated up to his SMA.  I do not think he has any options for stent graft repair and this will be an open repair.  I have reviewed imaging with multiple partnerss.  He did see cardiology and does not look like they have any concerns and have cleared him to proceed with surgery.  Likely will be a aortobifemoral bypass to the groins.  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  I discussed all the risks and benefits with the patient including high risk for major complication and or death.  Marty Heck, MD [facility] Office: [place]

## 2019-11-05 NOTE — Progress Notes (Signed)
Anesthesia Chart Review:  Case: [id number] Date/Time: 11/28/2019 0715   Procedure: OPEN ANEURYSM ABDOMINAL AORTIC REPAIR (N/A )   Anesthesia type: General   Pre-op diagnosis: ABDOMINAL AORTIC ANEURYSM   Location: [facility] OR ROOM 11 / [place] OR   Surgeons: Marty Heck, MD      DISCUSSION: Patient is a 69 year old male scheduled for the above procedure.  History includes smoking, DM2, ESRD (HD TTS, left brachiocephalic AVF 9/73/53; left arm venoplasty 10/14/18), HTN, AAA (6.1 cm 09/16/19), PVD (right EIA stent 02/22/14), hypothyroidism (on levothyroxine), spinal surgery.   He was evaluated by cardiologist Dr. Harl Bowie on 10/28/19. He wrote: 1. Preoperative cardiac evaluation - being considered for EVAR for AAA - no acute active cardiac conditions - tolerates greater than 4 METs regularly by history without limitation - recommend proceeding with surgery as planned, there is no indication for cardiac testing at this time.  - EKG today shows NSR, no acute ischemic changes  Received 2nd Pfizer Covid vaccine 10/05/19.  11/04/2019 presurgical COVID-19 test negative.  Anesthesia team to evaluate on the day of surgery.   VS: BP (!) 162/75   Pulse 80   Temp 36.8 C (Oral)   Resp 18   Wt 75 kg   SpO2 100%   BMI 24.43 kg/m    PROVIDERS: Sharilyn Sites, MD his PCP Carlyle Dolly, MD is cardiologist. Referred for preoperative evaluation prior to AAA repair. As needed follow-up recommended. Fran Lowes, MD is nephrologist ([place]). He had renal transplant evaluation at [facility]. Repair of AAA and smoking cessation recommended prior to patient being considered for transplant.   LABS: Labs reviewed: Acceptable for surgery. HGB 10.0, but consistent with previous results. T&S done. ISTAT day of surgery given his ESRD. (all labs ordered are listed, but only abnormal results are displayed)  Labs Reviewed  GLUCOSE, CAPILLARY - Abnormal; Notable for the following components:      Result  Value   Glucose-Capillary 231 (*)    All other components within normal limits  APTT - Abnormal; Notable for the following components:   aPTT 40 (*)    All other components within normal limits  CBC - Abnormal; Notable for the following components:   RBC 3.87 (*)    Hemoglobin 10.0 (*)    HCT 32.0 (*)    MCH 25.8 (*)    RDW 17.6 (*)    All other components within normal limits  COMPREHENSIVE METABOLIC PANEL - Abnormal; Notable for the following components:   Glucose, Bld 216 (*)    BUN 41 (*)    Creatinine, Ser 8.17 (*)    GFR calc non Af Amer 6 (*)    GFR calc Af Amer 7 (*)    All other components within normal limits  HEMOGLOBIN A1C - Abnormal; Notable for the following components:   Hgb A1c MFr Bld 7.9 (*)    All other components within normal limits  URINALYSIS, ROUTINE W REFLEX MICROSCOPIC - Abnormal; Notable for the following components:   Glucose, UA 150 (*)    Protein, ur 100 (*)    All other components within normal limits  SURGICAL PCR SCREEN  PROTIME-INR  TYPE AND SCREEN  ABO/RH     IMAGES: CTA abd/pelvis 09/16/19: IMPRESSION: - Infrarenal abdominal aortic aneurysm with the greatest axial measurement on today's CT estimated 6.1 cm, increased from September when the measurement was 5.5 cm. Aortic aneurysm NOS (ICD10-I71.9). - Moderate atherosclerotic changes of the lower thoracic and abdominal aorta with no high-grade stenosis.  Aortic Atherosclerosis (ICD10-I70.0). 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 - Left-sided iliac arterial disease without evidence of high-grade stenosis or occlusion of the CIA or EIA. There appears to be high-grade stenosis of the hypogastric artery origin. - Bilateral renal arterial disease. There appears to be greater than 50% narrowing at the origin the left renal artery, with greater  than 50% narrowing in the mid segment of the right renal artery secondary to calcified and soft plaque. - Additional ancillary findings as above.   EKG: 10/28/19: NSR   CV: Echo 09/28/19 IMPRESSIONS  1. Left ventricular ejection fraction, by estimation, is 60 to 65%. The  left ventricle has normal function. The left ventricle has no regional  wall motion abnormalities. There is mild left ventricular hypertrophy.  Left ventricular diastolic parameters  are consistent with Grade I diastolic dysfunction (impaired relaxation).  2. Right ventricular systolic function is normal. The right ventricular  size is normal.  3. The mitral valve is normal in structure. No evidence of mitral valve  regurgitation. No evidence of mitral stenosis.  4. The aortic valve is normal in structure. Aortic valve regurgitation is  not visualized. No aortic stenosis is present.  5. The inferior vena cava is normal in size with greater than 50%  respiratory variability, suggesting right atrial pressure of 3 mmHg.   Carotid US 10/27/19 IMPRESSION: - Color duplex indicates moderate heterogeneous and calcified plaque, with no hemodynamically significant stenosis by duplex criteria in the extracranial cerebrovascular circulation. -Systolic reversal of the left vertebral waveform indicating developing subclavian stenosis. Correlation with upper extremity blood pressures may be useful.   Past Medical History:  Diagnosis Date  . AAA (abdominal aortic aneurysm) ([place])   . Chronic kidney disease   . Diabetes mellitus without complication ([place])   . Hypertension   . Hypothyroidism   . PAD (peripheral artery disease) ([facility])    right EIA stent 02/22/14    Past Surgical History:  Procedure Laterality Date  . A/V FISTULAGRAM Left 10/14/2018   Procedure: A/V FISTULAGRAM;  Surgeon: Marty Heck, MD;  Location: [place] [facility];  Service: Cardiovascular;  Laterality: Left;  arm  . ABDOMINAL AORTAGRAM  N/A 02/22/2014   Procedure: ABDOMINAL Maxcine Ham;  Surgeon: Serafina Mitchell, MD;  Location: [facility] CATH LAB;  Service: Cardiovascular;  Laterality: N/A;  . AV FISTULA PLACEMENT Left 08/06/2018   Procedure: ARTERIOVENOUS (AV) FISTULA CREATION;  Surgeon: Serafina Mitchell, MD;  Location: [place];  Service: Vascular;  Laterality: Left;  . BACK SURGERY    . PERIPHERAL VASCULAR BALLOON ANGIOPLASTY Left 10/14/2018   Procedure: PERIPHERAL VASCULAR BALLOON ANGIOPLASTY;  Surgeon: Marty Heck, MD;  Location: [place] CV LAB;  Service: Cardiovascular;  Laterality: Left;  ARM FISTULA  . SPINE SURGERY      MEDICATIONS: . acetaminophen (TYLENOL) 325 MG tablet  . aspirin EC 81 MG tablet  . Cholecalciferol (VITAMIN D3) 50 MCG (2000 UT) TABS  . glipiZIDE (GLUCOTROL) 5 MG tablet  . labetalol (NORMODYNE) 200 MG tablet  . levothyroxine (SYNTHROID) 50 MCG tablet   No current facility-administered medications for this encounter.    Myra Gianotti, PA-C Surgical Short Stay/Anesthesiology [facility] Phone [phone] [facility] Phone [phone] 11/05/2019 10:15 AM

## 2019-11-05 NOTE — Anesthesia Preprocedure Evaluation (Addendum)
Anesthesia Evaluation  Patient identified by MRN, date of birth, ID band Patient awake    Reviewed: Allergy & Precautions, NPO status , Patient's Chart, lab work & pertinent test results, reviewed documented beta blocker date and time   History of Anesthesia Complications Negative for: history of anesthetic complications  Airway Mallampati: II  TM Distance: >3 FB Neck ROM: Full    Dental  (+) Edentulous Upper, Missing,    Pulmonary neg shortness of breath, neg COPD, neg recent URI, Current Smoker and Patient abstained from smoking.,    breath sounds clear to auscultation       Cardiovascular hypertension, Pt. on medications and Pt. on home beta blockers + Peripheral Vascular Disease   Rhythm:Regular  Echo 09/28/19 IMPRESSIONS  1. Left ventricular ejection fraction, by estimation, is 60 to 65%. The  left ventricle has normal function. The left ventricle has no regional  wall motion abnormalities. There is mild left ventricular hypertrophy.  Left ventricular diastolic parameters  are consistent with Grade I diastolic dysfunction (impaired relaxation).  2. Right ventricular systolic function is normal. The right ventricular  size is normal.  3. The mitral valve is normal in structure. No evidence of mitral valve  regurgitation. No evidence of mitral stenosis.  4. The aortic valve is normal in structure. Aortic valve regurgitation is  not visualized. No aortic stenosis is present.  5. The inferior vena cava is normal in size with greater than 50%  respiratory variability, suggesting right atrial pressure of 3 mmHg.   Carotid US 10/27/19 IMPRESSION: - Color duplex indicates moderate heterogeneous and calcified plaque, with no hemodynamically significant stenosis by duplex criteria in the extracranial cerebrovascular circulation. -Systolic reversal of the left vertebral waveform indicating developing subclavian stenosis.  Correlation with upper extremity blood pressures may be useful.   Neuro/Psych negative neurological ROS  negative psych ROS   GI/Hepatic   Endo/Other  diabetes, Oral Hypoglycemic AgentsHypothyroidism   Renal/GU ESRF and DialysisRenal diseaseHD T/Th/Sat     Musculoskeletal   Abdominal   Peds  Hematology  (+) Blood dyscrasia, anemia , ptt 40, plt 202, INR 1, Hgb 10   Anesthesia Other Findings   Reproductive/Obstetrics                           Anesthesia Physical Anesthesia Plan  ASA: III  Anesthesia Plan: General   Post-op Pain Management:    Induction: Intravenous  PONV Risk Score and Plan: 1 and Ondansetron  Airway Management Planned: Oral ETT  Additional Equipment: Arterial line, CVP and Ultrasound Guidance Line Placement  Intra-op Plan:   Post-operative Plan: Possible Post-op intubation/ventilation  Informed Consent: I have reviewed the patients History and Physical, chart, labs and discussed the procedure including the risks, benefits and alternatives for the proposed anesthesia with the patient or authorized representative who has indicated his/her understanding and acceptance.     Dental advisory given  Plan Discussed with: CRNA and Surgeon  Anesthesia Plan Comments: (PAT note written 11/05/2019 by Myra Gianotti, PA-C. )       Anesthesia Quick Evaluation

## 2019-11-06 DIAGNOSIS — N186 End stage renal disease: Secondary | ICD-10-CM | POA: Diagnosis not present

## 2019-11-06 DIAGNOSIS — Z992 Dependence on renal dialysis: Secondary | ICD-10-CM | POA: Diagnosis not present

## 2019-11-06 DIAGNOSIS — D631 Anemia in chronic kidney disease: Secondary | ICD-10-CM | POA: Diagnosis not present

## 2019-11-06 DIAGNOSIS — N2581 Secondary hyperparathyroidism of renal origin: Secondary | ICD-10-CM | POA: Diagnosis not present

## 2019-11-08 ENCOUNTER — Other Ambulatory Visit: Payer: Self-pay

## 2019-11-08 ENCOUNTER — Inpatient Hospital Stay (HOSPITAL_COMMUNITY): Payer: PPO

## 2019-11-08 ENCOUNTER — Encounter (HOSPITAL_COMMUNITY): Admission: RE | Disposition: E | Payer: Self-pay | Source: Home / Self Care | Attending: Vascular Surgery

## 2019-11-08 ENCOUNTER — Inpatient Hospital Stay (HOSPITAL_COMMUNITY)
Admission: RE | Admit: 2019-11-08 | Discharge: 2019-12-07 | DRG: 270 | Disposition: E | Payer: PPO | Attending: Vascular Surgery | Admitting: Vascular Surgery

## 2019-11-08 ENCOUNTER — Inpatient Hospital Stay (HOSPITAL_COMMUNITY): Payer: PPO | Admitting: Certified Registered Nurse Anesthetist

## 2019-11-08 ENCOUNTER — Encounter (HOSPITAL_COMMUNITY): Payer: Self-pay | Admitting: Vascular Surgery

## 2019-11-08 ENCOUNTER — Inpatient Hospital Stay (HOSPITAL_COMMUNITY): Payer: PPO | Admitting: Vascular Surgery

## 2019-11-08 DIAGNOSIS — K55029 Acute infarction of small intestine, extent unspecified: Secondary | ICD-10-CM | POA: Diagnosis not present

## 2019-11-08 DIAGNOSIS — Z79890 Hormone replacement therapy (postmenopausal): Secondary | ICD-10-CM

## 2019-11-08 DIAGNOSIS — R140 Abdominal distension (gaseous): Secondary | ICD-10-CM

## 2019-11-08 DIAGNOSIS — I4819 Other persistent atrial fibrillation: Secondary | ICD-10-CM | POA: Diagnosis not present

## 2019-11-08 DIAGNOSIS — Z515 Encounter for palliative care: Secondary | ICD-10-CM | POA: Diagnosis not present

## 2019-11-08 DIAGNOSIS — Z452 Encounter for adjustment and management of vascular access device: Secondary | ICD-10-CM | POA: Diagnosis not present

## 2019-11-08 DIAGNOSIS — E876 Hypokalemia: Secondary | ICD-10-CM | POA: Diagnosis not present

## 2019-11-08 DIAGNOSIS — R069 Unspecified abnormalities of breathing: Secondary | ICD-10-CM

## 2019-11-08 DIAGNOSIS — I469 Cardiac arrest, cause unspecified: Secondary | ICD-10-CM | POA: Diagnosis not present

## 2019-11-08 DIAGNOSIS — J449 Chronic obstructive pulmonary disease, unspecified: Secondary | ICD-10-CM | POA: Diagnosis present

## 2019-11-08 DIAGNOSIS — R579 Shock, unspecified: Secondary | ICD-10-CM | POA: Diagnosis not present

## 2019-11-08 DIAGNOSIS — Z4682 Encounter for fitting and adjustment of non-vascular catheter: Secondary | ICD-10-CM | POA: Diagnosis not present

## 2019-11-08 DIAGNOSIS — I471 Supraventricular tachycardia: Secondary | ICD-10-CM | POA: Diagnosis present

## 2019-11-08 DIAGNOSIS — I739 Peripheral vascular disease, unspecified: Secondary | ICD-10-CM | POA: Diagnosis not present

## 2019-11-08 DIAGNOSIS — I743 Embolism and thrombosis of arteries of the lower extremities: Secondary | ICD-10-CM | POA: Diagnosis present

## 2019-11-08 DIAGNOSIS — E861 Hypovolemia: Secondary | ICD-10-CM | POA: Diagnosis not present

## 2019-11-08 DIAGNOSIS — N179 Acute kidney failure, unspecified: Secondary | ICD-10-CM | POA: Diagnosis not present

## 2019-11-08 DIAGNOSIS — K55049 Acute infarction of large intestine, extent unspecified: Secondary | ICD-10-CM | POA: Diagnosis not present

## 2019-11-08 DIAGNOSIS — I480 Paroxysmal atrial fibrillation: Secondary | ICD-10-CM | POA: Diagnosis not present

## 2019-11-08 DIAGNOSIS — J9690 Respiratory failure, unspecified, unspecified whether with hypoxia or hypercapnia: Secondary | ICD-10-CM

## 2019-11-08 DIAGNOSIS — I9581 Postprocedural hypotension: Secondary | ICD-10-CM | POA: Diagnosis not present

## 2019-11-08 DIAGNOSIS — J9811 Atelectasis: Secondary | ICD-10-CM | POA: Diagnosis not present

## 2019-11-08 DIAGNOSIS — I714 Abdominal aortic aneurysm, without rupture, unspecified: Secondary | ICD-10-CM | POA: Diagnosis present

## 2019-11-08 DIAGNOSIS — I70211 Atherosclerosis of native arteries of extremities with intermittent claudication, right leg: Secondary | ICD-10-CM | POA: Diagnosis not present

## 2019-11-08 DIAGNOSIS — I700 Atherosclerosis of aorta: Secondary | ICD-10-CM | POA: Diagnosis not present

## 2019-11-08 DIAGNOSIS — E875 Hyperkalemia: Secondary | ICD-10-CM | POA: Diagnosis not present

## 2019-11-08 DIAGNOSIS — G9341 Metabolic encephalopathy: Secondary | ICD-10-CM | POA: Diagnosis not present

## 2019-11-08 DIAGNOSIS — R578 Other shock: Secondary | ICD-10-CM | POA: Diagnosis not present

## 2019-11-08 DIAGNOSIS — D631 Anemia in chronic kidney disease: Secondary | ICD-10-CM | POA: Diagnosis present

## 2019-11-08 DIAGNOSIS — K7200 Acute and subacute hepatic failure without coma: Secondary | ICD-10-CM | POA: Diagnosis not present

## 2019-11-08 DIAGNOSIS — K558 Other vascular disorders of intestine: Secondary | ICD-10-CM | POA: Diagnosis not present

## 2019-11-08 DIAGNOSIS — Z992 Dependence on renal dialysis: Secondary | ICD-10-CM | POA: Diagnosis not present

## 2019-11-08 DIAGNOSIS — J9601 Acute respiratory failure with hypoxia: Secondary | ICD-10-CM | POA: Diagnosis not present

## 2019-11-08 DIAGNOSIS — J9809 Other diseases of bronchus, not elsewhere classified: Secondary | ICD-10-CM | POA: Diagnosis not present

## 2019-11-08 DIAGNOSIS — N186 End stage renal disease: Secondary | ICD-10-CM | POA: Diagnosis present

## 2019-11-08 DIAGNOSIS — Z7982 Long term (current) use of aspirin: Secondary | ICD-10-CM

## 2019-11-08 DIAGNOSIS — E872 Acidosis: Secondary | ICD-10-CM | POA: Diagnosis not present

## 2019-11-08 DIAGNOSIS — Z8679 Personal history of other diseases of the circulatory system: Secondary | ICD-10-CM | POA: Diagnosis not present

## 2019-11-08 DIAGNOSIS — F17210 Nicotine dependence, cigarettes, uncomplicated: Secondary | ICD-10-CM | POA: Diagnosis present

## 2019-11-08 DIAGNOSIS — I70222 Atherosclerosis of native arteries of extremities with rest pain, left leg: Secondary | ICD-10-CM | POA: Diagnosis not present

## 2019-11-08 DIAGNOSIS — Z01818 Encounter for other preprocedural examination: Secondary | ICD-10-CM

## 2019-11-08 DIAGNOSIS — S31109A Unspecified open wound of abdominal wall, unspecified quadrant without penetration into peritoneal cavity, initial encounter: Secondary | ICD-10-CM | POA: Diagnosis not present

## 2019-11-08 DIAGNOSIS — J9602 Acute respiratory failure with hypercapnia: Secondary | ICD-10-CM | POA: Diagnosis not present

## 2019-11-08 DIAGNOSIS — I120 Hypertensive chronic kidney disease with stage 5 chronic kidney disease or end stage renal disease: Secondary | ICD-10-CM | POA: Diagnosis present

## 2019-11-08 DIAGNOSIS — D696 Thrombocytopenia, unspecified: Secondary | ICD-10-CM | POA: Diagnosis not present

## 2019-11-08 DIAGNOSIS — Z9889 Other specified postprocedural states: Secondary | ICD-10-CM

## 2019-11-08 DIAGNOSIS — E1151 Type 2 diabetes mellitus with diabetic peripheral angiopathy without gangrene: Secondary | ICD-10-CM | POA: Diagnosis not present

## 2019-11-08 DIAGNOSIS — K55039 Acute (reversible) ischemia of large intestine, extent unspecified: Secondary | ICD-10-CM | POA: Diagnosis not present

## 2019-11-08 DIAGNOSIS — N250 Renal osteodystrophy: Secondary | ICD-10-CM | POA: Diagnosis not present

## 2019-11-08 DIAGNOSIS — I97191 Other postprocedural cardiac functional disturbances following other surgery: Secondary | ICD-10-CM | POA: Diagnosis not present

## 2019-11-08 DIAGNOSIS — Z79899 Other long term (current) drug therapy: Secondary | ICD-10-CM

## 2019-11-08 DIAGNOSIS — D62 Acute posthemorrhagic anemia: Secondary | ICD-10-CM | POA: Diagnosis not present

## 2019-11-08 DIAGNOSIS — E039 Hypothyroidism, unspecified: Secondary | ICD-10-CM | POA: Diagnosis not present

## 2019-11-08 DIAGNOSIS — E1122 Type 2 diabetes mellitus with diabetic chronic kidney disease: Secondary | ICD-10-CM | POA: Diagnosis present

## 2019-11-08 DIAGNOSIS — I4891 Unspecified atrial fibrillation: Secondary | ICD-10-CM | POA: Diagnosis not present

## 2019-11-08 DIAGNOSIS — K529 Noninfective gastroenteritis and colitis, unspecified: Secondary | ICD-10-CM | POA: Diagnosis not present

## 2019-11-08 DIAGNOSIS — I998 Other disorder of circulatory system: Secondary | ICD-10-CM | POA: Diagnosis not present

## 2019-11-08 DIAGNOSIS — I953 Hypotension of hemodialysis: Secondary | ICD-10-CM | POA: Diagnosis not present

## 2019-11-08 DIAGNOSIS — E1165 Type 2 diabetes mellitus with hyperglycemia: Secondary | ICD-10-CM | POA: Diagnosis present

## 2019-11-08 DIAGNOSIS — J90 Pleural effusion, not elsewhere classified: Secondary | ICD-10-CM | POA: Diagnosis not present

## 2019-11-08 DIAGNOSIS — K661 Hemoperitoneum: Secondary | ICD-10-CM | POA: Diagnosis not present

## 2019-11-08 DIAGNOSIS — D638 Anemia in other chronic diseases classified elsewhere: Secondary | ICD-10-CM | POA: Diagnosis not present

## 2019-11-08 DIAGNOSIS — I745 Embolism and thrombosis of iliac artery: Secondary | ICD-10-CM | POA: Diagnosis present

## 2019-11-08 DIAGNOSIS — Z8249 Family history of ischemic heart disease and other diseases of the circulatory system: Secondary | ICD-10-CM

## 2019-11-08 DIAGNOSIS — K55019 Acute (reversible) ischemia of small intestine, extent unspecified: Secondary | ICD-10-CM | POA: Diagnosis not present

## 2019-11-08 DIAGNOSIS — K819 Cholecystitis, unspecified: Secondary | ICD-10-CM | POA: Diagnosis not present

## 2019-11-08 DIAGNOSIS — Z7984 Long term (current) use of oral hypoglycemic drugs: Secondary | ICD-10-CM

## 2019-11-08 DIAGNOSIS — K551 Chronic vascular disorders of intestine: Secondary | ICD-10-CM | POA: Diagnosis not present

## 2019-11-08 DIAGNOSIS — R0902 Hypoxemia: Secondary | ICD-10-CM

## 2019-11-08 DIAGNOSIS — K7589 Other specified inflammatory liver diseases: Secondary | ICD-10-CM | POA: Diagnosis not present

## 2019-11-08 DIAGNOSIS — K810 Acute cholecystitis: Secondary | ICD-10-CM | POA: Diagnosis not present

## 2019-11-08 DIAGNOSIS — R0602 Shortness of breath: Secondary | ICD-10-CM

## 2019-11-08 DIAGNOSIS — Z9289 Personal history of other medical treatment: Secondary | ICD-10-CM

## 2019-11-08 DIAGNOSIS — E1129 Type 2 diabetes mellitus with other diabetic kidney complication: Secondary | ICD-10-CM | POA: Diagnosis not present

## 2019-11-08 DIAGNOSIS — K650 Generalized (acute) peritonitis: Secondary | ICD-10-CM | POA: Diagnosis not present

## 2019-11-08 DIAGNOSIS — I10 Essential (primary) hypertension: Secondary | ICD-10-CM | POA: Diagnosis not present

## 2019-11-08 DIAGNOSIS — Z9911 Dependence on respirator [ventilator] status: Secondary | ICD-10-CM | POA: Diagnosis not present

## 2019-11-08 HISTORY — PX: ABDOMINAL AORTIC ANEURYSM REPAIR: SHX42

## 2019-11-08 LAB — BLOOD GAS, ARTERIAL
Acid-base deficit: 4.8 mmol/L — ABNORMAL HIGH (ref 0.0–2.0)
Bicarbonate: 20.1 mmol/L (ref 20.0–28.0)
FIO2: 36
O2 Saturation: 97.3 %
Patient temperature: 37.1
pCO2 arterial: 39.7 mmHg (ref 32.0–48.0)
pH, Arterial: 7.326 — ABNORMAL LOW (ref 7.350–7.450)
pO2, Arterial: 105 mmHg (ref 83.0–108.0)

## 2019-11-08 LAB — BASIC METABOLIC PANEL
Anion gap: 9 (ref 5–15)
BUN: 43 mg/dL — ABNORMAL HIGH (ref 8–23)
CO2: 21 mmol/L — ABNORMAL LOW (ref 22–32)
Calcium: 8.1 mg/dL — ABNORMAL LOW (ref 8.9–10.3)
Chloride: 107 mmol/L (ref 98–111)
Creatinine, Ser: 7.55 mg/dL — ABNORMAL HIGH (ref 0.61–1.24)
GFR calc Af Amer: 8 mL/min — ABNORMAL LOW (ref >60)
GFR calc non Af Amer: 7 mL/min — ABNORMAL LOW (ref >60)
Glucose, Bld: 202 mg/dL — ABNORMAL HIGH (ref 70–99)
Potassium: 5.8 mmol/L — ABNORMAL HIGH (ref 3.5–5.1)
Sodium: 137 mmol/L (ref 135–145)

## 2019-11-08 LAB — CBC
HCT: 32.9 % — ABNORMAL LOW (ref 39.0–52.0)
HCT: 36.4 % — ABNORMAL LOW (ref 39.0–52.0)
Hemoglobin: 10.8 g/dL — ABNORMAL LOW (ref 13.0–17.0)
Hemoglobin: 12.3 g/dL — ABNORMAL LOW (ref 13.0–17.0)
MCH: 27.7 pg (ref 26.0–34.0)
MCH: 27.9 pg (ref 26.0–34.0)
MCHC: 32.8 g/dL (ref 30.0–36.0)
MCHC: 33.8 g/dL (ref 30.0–36.0)
MCV: 84.4 fL (ref 80.0–100.0)
MCV: 82.5 fL (ref 80.0–100.0)
Platelets: 121 10*3/uL — ABNORMAL LOW (ref 150–400)
Platelets: 123 10*3/uL — ABNORMAL LOW (ref 150–400)
RBC: 3.9 MIL/uL — ABNORMAL LOW (ref 4.22–5.81)
RBC: 4.41 MIL/uL (ref 4.22–5.81)
RDW: 16.8 % — ABNORMAL HIGH (ref 11.5–15.5)
RDW: 16.8 % — ABNORMAL HIGH (ref 11.5–15.5)
WBC: 7.7 10*3/uL (ref 4.0–10.5)
WBC: 10.5 10*3/uL (ref 4.0–10.5)
nRBC: 0 % (ref 0.0–0.2)
nRBC: 0 % (ref 0.0–0.2)

## 2019-11-08 LAB — POCT I-STAT, CHEM 8
BUN: 38 mg/dL — ABNORMAL HIGH (ref 8–23)
BUN: 42 mg/dL — ABNORMAL HIGH (ref 8–23)
BUN: 42 mg/dL — ABNORMAL HIGH (ref 8–23)
Calcium, Ion: 1.15 mmol/L (ref 1.15–1.40)
Calcium, Ion: 1.08 mmol/L — ABNORMAL LOW (ref 1.15–1.40)
Calcium, Ion: 1.11 mmol/L — ABNORMAL LOW (ref 1.15–1.40)
Chloride: 102 mmol/L (ref 98–111)
Chloride: 105 mmol/L (ref 98–111)
Chloride: 106 mmol/L (ref 98–111)
Creatinine, Ser: 8.2 mg/dL — ABNORMAL HIGH (ref 0.61–1.24)
Creatinine, Ser: 7.3 mg/dL — ABNORMAL HIGH (ref 0.61–1.24)
Creatinine, Ser: 7.4 mg/dL — ABNORMAL HIGH (ref 0.61–1.24)
Glucose, Bld: 90 mg/dL (ref 70–99)
Glucose, Bld: 169 mg/dL — ABNORMAL HIGH (ref 70–99)
Glucose, Bld: 305 mg/dL — ABNORMAL HIGH (ref 70–99)
HCT: 27 % — ABNORMAL LOW (ref 39.0–52.0)
HCT: 27 % — ABNORMAL LOW (ref 39.0–52.0)
HCT: 27 % — ABNORMAL LOW (ref 39.0–52.0)
Hemoglobin: 9.2 g/dL — ABNORMAL LOW (ref 13.0–17.0)
Hemoglobin: 9.2 g/dL — ABNORMAL LOW (ref 13.0–17.0)
Hemoglobin: 9.2 g/dL — ABNORMAL LOW (ref 13.0–17.0)
Potassium: 4 mmol/L (ref 3.5–5.1)
Potassium: 6.8 mmol/L (ref 3.5–5.1)
Potassium: 7.4 mmol/L (ref 3.5–5.1)
Sodium: 139 mmol/L (ref 135–145)
Sodium: 135 mmol/L (ref 135–145)
Sodium: 136 mmol/L (ref 135–145)
TCO2: 25 mmol/L (ref 22–32)
TCO2: 24 mmol/L (ref 22–32)
TCO2: 26 mmol/L (ref 22–32)

## 2019-11-08 LAB — POCT I-STAT 7, (LYTES, BLD GAS, ICA,H+H)
Acid-Base Excess: 1 mmol/L (ref 0.0–2.0)
Acid-base deficit: 4 mmol/L — ABNORMAL HIGH (ref 0.0–2.0)
Acid-base deficit: 5 mmol/L — ABNORMAL HIGH (ref 0.0–2.0)
Acid-base deficit: 3 mmol/L — ABNORMAL HIGH (ref 0.0–2.0)
Bicarbonate: 25.6 mmol/L (ref 20.0–28.0)
Bicarbonate: 21.6 mmol/L (ref 20.0–28.0)
Bicarbonate: 22.5 mmol/L (ref 20.0–28.0)
Bicarbonate: 23 mmol/L (ref 20.0–28.0)
Calcium, Ion: 1.14 mmol/L — ABNORMAL LOW (ref 1.15–1.40)
Calcium, Ion: 1 mmol/L — ABNORMAL LOW (ref 1.15–1.40)
Calcium, Ion: 1.12 mmol/L — ABNORMAL LOW (ref 1.15–1.40)
Calcium, Ion: 1.12 mmol/L — ABNORMAL LOW (ref 1.15–1.40)
HCT: 25 % — ABNORMAL LOW (ref 39.0–52.0)
HCT: 23 % — ABNORMAL LOW (ref 39.0–52.0)
HCT: 24 % — ABNORMAL LOW (ref 39.0–52.0)
HCT: 28 % — ABNORMAL LOW (ref 39.0–52.0)
Hemoglobin: 8.5 g/dL — ABNORMAL LOW (ref 13.0–17.0)
Hemoglobin: 7.8 g/dL — ABNORMAL LOW (ref 13.0–17.0)
Hemoglobin: 8.2 g/dL — ABNORMAL LOW (ref 13.0–17.0)
Hemoglobin: 9.5 g/dL — ABNORMAL LOW (ref 13.0–17.0)
O2 Saturation: 100 %
O2 Saturation: 100 %
O2 Saturation: 100 %
O2 Saturation: 100 %
Patient temperature: 34.5
Patient temperature: 35.7
Patient temperature: 35.9
Patient temperature: 35.6
Potassium: 4.3 mmol/L (ref 3.5–5.1)
Potassium: 5.3 mmol/L — ABNORMAL HIGH (ref 3.5–5.1)
Potassium: 6.1 mmol/L — ABNORMAL HIGH (ref 3.5–5.1)
Potassium: 7.3 mmol/L (ref 3.5–5.1)
Sodium: 137 mmol/L (ref 135–145)
Sodium: 136 mmol/L (ref 135–145)
Sodium: 136 mmol/L (ref 135–145)
Sodium: 135 mmol/L (ref 135–145)
TCO2: 27 mmol/L (ref 22–32)
TCO2: 23 mmol/L (ref 22–32)
TCO2: 24 mmol/L (ref 22–32)
TCO2: 24 mmol/L (ref 22–32)
pCO2 arterial: 36.3 mmHg (ref 32.0–48.0)
pCO2 arterial: 43.4 mmHg (ref 32.0–48.0)
pCO2 arterial: 44.2 mmHg (ref 32.0–48.0)
pCO2 arterial: 43 mmHg (ref 32.0–48.0)
pH, Arterial: 7.446 (ref 7.350–7.450)
pH, Arterial: 7.291 — ABNORMAL LOW (ref 7.350–7.450)
pH, Arterial: 7.317 — ABNORMAL LOW (ref 7.350–7.450)
pH, Arterial: 7.33 — ABNORMAL LOW (ref 7.350–7.450)
pO2, Arterial: 422 mmHg — ABNORMAL HIGH (ref 83.0–108.0)
pO2, Arterial: 215 mmHg — ABNORMAL HIGH (ref 83.0–108.0)
pO2, Arterial: 240 mmHg — ABNORMAL HIGH (ref 83.0–108.0)
pO2, Arterial: 258 mmHg — ABNORMAL HIGH (ref 83.0–108.0)

## 2019-11-08 LAB — POCT ACTIVATED CLOTTING TIME
Activated Clotting Time: 191 seconds
Activated Clotting Time: 235 seconds
Activated Clotting Time: 241 seconds
Activated Clotting Time: 120 seconds

## 2019-11-08 LAB — PHOSPHORUS: Phosphorus: 6 mg/dL — ABNORMAL HIGH (ref 2.5–4.6)

## 2019-11-08 LAB — PROTIME-INR
INR: 1.3 — ABNORMAL HIGH (ref 0.8–1.2)
Prothrombin Time: 15.4 seconds — ABNORMAL HIGH (ref 11.4–15.2)

## 2019-11-08 LAB — HEMOGLOBIN A1C
Hgb A1c MFr Bld: 6.7 % — ABNORMAL HIGH (ref 4.8–5.6)
Mean Plasma Glucose: 145.59 mg/dL

## 2019-11-08 LAB — GLUCOSE, CAPILLARY
Glucose-Capillary: 98 mg/dL (ref 70–99)
Glucose-Capillary: 196 mg/dL — ABNORMAL HIGH (ref 70–99)
Glucose-Capillary: 193 mg/dL — ABNORMAL HIGH (ref 70–99)

## 2019-11-08 LAB — MAGNESIUM: Magnesium: 1.5 mg/dL — ABNORMAL LOW (ref 1.7–2.4)

## 2019-11-08 LAB — PREPARE RBC (CROSSMATCH)

## 2019-11-08 LAB — APTT: aPTT: 46 seconds — ABNORMAL HIGH (ref 24–36)

## 2019-11-08 SURGERY — ANEURYSM ABDOMINAL AORTIC REPAIR
Anesthesia: General | Site: Abdomen

## 2019-11-08 MED ORDER — SODIUM CHLORIDE 0.9 % IV SOLN: INTRAVENOUS | Status: DC | PRN

## 2019-11-08 MED ORDER — LIDOCAINE 2% (20 MG/ML) 5 ML SYRINGE
INTRAMUSCULAR | Status: DC | PRN
  Administered 2019-11-08: 60 mg via INTRAVENOUS

## 2019-11-08 MED ORDER — HYDROMORPHONE HCL 1 MG/ML IJ SOLN
INTRAMUSCULAR | Status: DC | PRN
  Administered 2019-11-08 (×2): .25 mg via INTRAVENOUS

## 2019-11-08 MED ORDER — SODIUM CHLORIDE 0.9 % IV SOLN
INTRAVENOUS | Status: DC | PRN
  Administered 2019-11-08: 500 mL

## 2019-11-08 MED ORDER — LABETALOL HCL 5 MG/ML IV SOLN
INTRAVENOUS | Status: AC
  Administered 2019-11-08: 10 mg via INTRAVENOUS
  Filled 2019-11-08: qty 4

## 2019-11-08 MED ORDER — CHLORHEXIDINE GLUCONATE CLOTH 2 % EX PADS: 6.0000 | MEDICATED_PAD | Freq: Once | CUTANEOUS | Status: DC

## 2019-11-08 MED ORDER — SODIUM CHLORIDE 0.9 % IV SOLN: INTRAVENOUS | Status: DC

## 2019-11-08 MED ORDER — GUAIFENESIN-DM 100-10 MG/5ML PO SYRP: 15.0000 mL | ORAL_SOLUTION | ORAL | Status: DC | PRN

## 2019-11-08 MED ORDER — GLYCOPYRROLATE 0.2 MG/ML IJ SOLN
INTRAMUSCULAR | Status: DC | PRN
Start: 2019-11-08 — End: 2019-11-08
  Administered 2019-11-08: .8 mg via INTRAVENOUS

## 2019-11-08 MED ORDER — ACETAMINOPHEN 500 MG PO TABS: 1000.0000 mg | ORAL_TABLET | Freq: Once | ORAL | Status: DC | PRN

## 2019-11-08 MED ORDER — MIDAZOLAM HCL 2 MG/2ML IJ SOLN
INTRAMUSCULAR | Status: AC
  Filled 2019-11-08: qty 2

## 2019-11-08 MED ORDER — CEFAZOLIN SODIUM-DEXTROSE 2-4 GM/100ML-% IV SOLN
2.0000 g | INTRAVENOUS | Status: AC
  Administered 2019-11-08: 2 g via INTRAVENOUS
  Filled 2019-11-08: qty 100

## 2019-11-08 MED ORDER — FENTANYL CITRATE (PF) 250 MCG/5ML IJ SOLN
INTRAMUSCULAR | Status: DC | PRN
  Administered 2019-11-08: 25 ug via INTRAVENOUS
  Administered 2019-11-08: 150 ug via INTRAVENOUS
  Administered 2019-11-08: 25 ug via INTRAVENOUS
  Administered 2019-11-08: 50 ug via INTRAVENOUS

## 2019-11-08 MED ORDER — 0.9 % SODIUM CHLORIDE (POUR BTL) OPTIME
TOPICAL | Status: DC | PRN
  Administered 2019-11-08: 1000 mL
  Administered 2019-11-08: 2000 mL

## 2019-11-08 MED ORDER — PROTAMINE SULFATE 10 MG/ML IV SOLN
INTRAVENOUS | Status: AC
  Filled 2019-11-08: qty 5

## 2019-11-08 MED ORDER — PANTOPRAZOLE SODIUM 40 MG IV SOLR
40.0000 mg | Freq: Every day | INTRAVENOUS | Status: DC
  Administered 2019-11-08 – 2019-11-15 (×8): 40 mg via INTRAVENOUS
  Filled 2019-11-08 (×8): qty 40

## 2019-11-08 MED ORDER — ACETAMINOPHEN 160 MG/5ML PO SOLN: 1000.0000 mg | Freq: Once | ORAL | Status: DC | PRN

## 2019-11-08 MED ORDER — HYDRALAZINE HCL 20 MG/ML IJ SOLN
INTRAMUSCULAR | Status: AC
  Administered 2019-11-08: 5 mg via INTRAVENOUS
  Filled 2019-11-08: qty 1

## 2019-11-08 MED ORDER — ACETAMINOPHEN 10 MG/ML IV SOLN: 1000.0000 mg | Freq: Once | INTRAVENOUS | Status: DC | PRN

## 2019-11-08 MED ORDER — PHENYLEPHRINE 40 MCG/ML (10ML) SYRINGE FOR IV PUSH (FOR BLOOD PRESSURE SUPPORT): PREFILLED_SYRINGE | INTRAVENOUS | Status: DC | PRN

## 2019-11-08 MED ORDER — VECURONIUM BROMIDE 10 MG IV SOLR
INTRAVENOUS | Status: DC | PRN
  Administered 2019-11-08: 3 mg via INTRAVENOUS
  Administered 2019-11-08: 4 mg via INTRAVENOUS
  Administered 2019-11-08: 3 mg via INTRAVENOUS
  Administered 2019-11-08: 7 mg via INTRAVENOUS

## 2019-11-08 MED ORDER — BISACODYL 10 MG RE SUPP: 10.0000 mg | Freq: Every day | RECTAL | Status: DC | PRN

## 2019-11-08 MED ORDER — LABETALOL HCL 5 MG/ML IV SOLN
0.5000 mg/min | INTRAVENOUS | Status: DC
  Administered 2019-11-08: 0.5 mg/min via INTRAVENOUS
  Filled 2019-11-08 (×2): qty 100

## 2019-11-08 MED ORDER — NEOSTIGMINE METHYLSULFATE 3 MG/3ML IV SOSY
PREFILLED_SYRINGE | INTRAVENOUS | Status: AC
  Filled 2019-11-08: qty 3

## 2019-11-08 MED ORDER — PHENYLEPHRINE HCL-NACL 10-0.9 MG/250ML-% IV SOLN
INTRAVENOUS | Status: DC | PRN
  Administered 2019-11-08: 25 ug/min via INTRAVENOUS

## 2019-11-08 MED ORDER — ONDANSETRON HCL 4 MG/2ML IJ SOLN: 4.0000 mg | Freq: Four times a day (QID) | INTRAMUSCULAR | Status: DC | PRN

## 2019-11-08 MED ORDER — DEXTROSE 50 % IV SOLN
INTRAVENOUS | Status: DC | PRN
  Administered 2019-11-08 (×2): 12.5 g via INTRAVENOUS

## 2019-11-08 MED ORDER — SODIUM CHLORIDE 0.9 % IV SOLN: 100.0000 mL | INTRAVENOUS | Status: DC | PRN

## 2019-11-08 MED ORDER — ACETAMINOPHEN 10 MG/ML IV SOLN
INTRAVENOUS | Status: DC | PRN
Start: 2019-11-08 — End: 2019-11-08
  Administered 2019-11-08: 1000 mg via INTRAVENOUS

## 2019-11-08 MED ORDER — PROTAMINE SULFATE 10 MG/ML IV SOLN
INTRAVENOUS | Status: DC | PRN
  Administered 2019-11-08: 10 mg via INTRAVENOUS
  Administered 2019-11-08: 20 mg via INTRAVENOUS
  Administered 2019-11-08: 40 mg via INTRAVENOUS

## 2019-11-08 MED ORDER — LIDOCAINE 2% (20 MG/ML) 5 ML SYRINGE
INTRAMUSCULAR | Status: AC
  Filled 2019-11-08: qty 5

## 2019-11-08 MED ORDER — ONDANSETRON HCL 4 MG/2ML IJ SOLN
INTRAMUSCULAR | Status: AC
  Filled 2019-11-08: qty 2

## 2019-11-08 MED ORDER — LIDOCAINE-PRILOCAINE 2.5-2.5 % EX CREA
1.0000 "application " | TOPICAL_CREAM | CUTANEOUS | Status: DC | PRN
  Filled 2019-11-08: qty 5

## 2019-11-08 MED ORDER — DEXTROSE 50 % IV SOLN
50.0000 mL | Freq: Once | INTRAVENOUS | Status: AC
  Administered 2019-11-08: 50 mL via INTRAVENOUS
  Filled 2019-11-08 (×2): qty 50

## 2019-11-08 MED ORDER — HYDROMORPHONE HCL 1 MG/ML IJ SOLN
INTRAMUSCULAR | Status: AC
  Filled 2019-11-08: qty 0.5

## 2019-11-08 MED ORDER — SODIUM CHLORIDE 0.9 % IV SOLN: 500.0000 mL | Freq: Once | INTRAVENOUS | Status: DC | PRN

## 2019-11-08 MED ORDER — HEPARIN SODIUM (PORCINE) 1000 UNIT/ML IJ SOLN
INTRAMUSCULAR | Status: DC | PRN
  Administered 2019-11-08: 2000 [IU] via INTRAVENOUS
  Administered 2019-11-08: 4000 [IU] via INTRAVENOUS
  Administered 2019-11-08: 8000 [IU] via INTRAVENOUS
  Administered 2019-11-08: 2000 [IU] via INTRAVENOUS

## 2019-11-08 MED ORDER — CALCIUM CHLORIDE 10 % IV SOLN
INTRAVENOUS | Status: DC | PRN
  Administered 2019-11-08: 200 mg via INTRAVENOUS
  Administered 2019-11-08: 100 mg via INTRAVENOUS
  Administered 2019-11-08 (×2): 200 mg via INTRAVENOUS

## 2019-11-08 MED ORDER — MAGNESIUM SULFATE 2 GM/50ML IV SOLN
2.0000 g | Freq: Once | INTRAVENOUS | Status: AC | PRN
  Administered 2019-11-09: 2 g via INTRAVENOUS
  Filled 2019-11-08: qty 50

## 2019-11-08 MED ORDER — ACETAMINOPHEN 325 MG PO TABS: 325.0000 mg | ORAL_TABLET | ORAL | Status: DC | PRN

## 2019-11-08 MED ORDER — CEFAZOLIN SODIUM-DEXTROSE 2-4 GM/100ML-% IV SOLN
2.0000 g | Freq: Three times a day (TID) | INTRAVENOUS | Status: AC
  Filled 2019-11-08: qty 100

## 2019-11-08 MED ORDER — HYDROMORPHONE HCL 1 MG/ML IJ SOLN
0.5000 mg | INTRAMUSCULAR | Status: DC | PRN
  Administered 2019-11-08 – 2019-11-09 (×2): 1 mg via INTRAVENOUS
  Filled 2019-11-08 (×3): qty 1

## 2019-11-08 MED ORDER — ALTEPLASE 2 MG IJ SOLR: 2.0000 mg | Freq: Once | INTRAMUSCULAR | Status: DC | PRN

## 2019-11-08 MED ORDER — HYDRALAZINE HCL 20 MG/ML IJ SOLN
INTRAMUSCULAR | Status: AC
  Filled 2019-11-08: qty 1

## 2019-11-08 MED ORDER — INSULIN ASPART 100 UNIT/ML IV SOLN
5.0000 [IU] | Freq: Once | INTRAVENOUS | Status: AC
  Administered 2019-11-08: 5 [IU] via INTRAVENOUS
  Filled 2019-11-08: qty 0.05

## 2019-11-08 MED ORDER — HYDRALAZINE HCL 20 MG/ML IJ SOLN
INTRAMUSCULAR | Status: DC | PRN
Start: 2019-11-08 — End: 2019-11-08
  Administered 2019-11-08: 10 mg via INTRAVENOUS

## 2019-11-08 MED ORDER — PROPOFOL 10 MG/ML IV BOLUS
INTRAVENOUS | Status: DC | PRN
  Administered 2019-11-08: 50 mg via INTRAVENOUS
  Administered 2019-11-08: 40 mg via INTRAVENOUS

## 2019-11-08 MED ORDER — FENTANYL CITRATE (PF) 100 MCG/2ML IJ SOLN: 25.0000 ug | INTRAMUSCULAR | Status: DC | PRN

## 2019-11-08 MED ORDER — SODIUM CHLORIDE 0.9 % IV SOLN
INTRAVENOUS | Status: AC
  Filled 2019-11-08: qty 1.2

## 2019-11-08 MED ORDER — CHLORHEXIDINE GLUCONATE CLOTH 2 % EX PADS
6.0000 | MEDICATED_PAD | Freq: Every day | CUTANEOUS | Status: DC
  Administered 2019-11-08 – 2019-11-16 (×8): 6 via TOPICAL

## 2019-11-08 MED ORDER — METOPROLOL TARTRATE 5 MG/5ML IV SOLN
2.0000 mg | INTRAVENOUS | Status: AC | PRN
  Administered 2019-11-08 (×2): 5 mg via INTRAVENOUS
  Filled 2019-11-08: qty 5

## 2019-11-08 MED ORDER — HEPARIN SODIUM (PORCINE) 5000 UNIT/ML IJ SOLN
5000.0000 [IU] | Freq: Three times a day (TID) | INTRAMUSCULAR | Status: DC
  Administered 2019-11-10 – 2019-11-12 (×6): 5000 [IU] via SUBCUTANEOUS
  Filled 2019-11-08 (×5): qty 1

## 2019-11-08 MED ORDER — PROPOFOL 10 MG/ML IV BOLUS
INTRAVENOUS | Status: AC
  Filled 2019-11-08: qty 40

## 2019-11-08 MED ORDER — INSULIN ASPART 100 UNIT/ML ~~LOC~~ SOLN
0.0000 [IU] | Freq: Three times a day (TID) | SUBCUTANEOUS | Status: DC
  Administered 2019-11-08: 3 [IU] via SUBCUTANEOUS

## 2019-11-08 MED ORDER — OXYCODONE HCL 5 MG/5ML PO SOLN: 5.0000 mg | Freq: Once | ORAL | Status: DC | PRN

## 2019-11-08 MED ORDER — METOPROLOL TARTRATE 5 MG/5ML IV SOLN
2.5000 mg | Freq: Four times a day (QID) | INTRAVENOUS | Status: DC
  Administered 2019-11-08 (×2): 2.5 mg via INTRAVENOUS
  Filled 2019-11-08 (×2): qty 5

## 2019-11-08 MED ORDER — LABETALOL HCL 5 MG/ML IV SOLN
INTRAVENOUS | Status: AC
  Filled 2019-11-08: qty 4

## 2019-11-08 MED ORDER — LABETALOL HCL 5 MG/ML IV SOLN
INTRAVENOUS | Status: DC | PRN
Start: 2019-11-08 — End: 2019-11-08
  Administered 2019-11-08: 5 mg via INTRAVENOUS
  Administered 2019-11-08: 10 mg via INTRAVENOUS
  Administered 2019-11-08: 5 mg via INTRAVENOUS
  Administered 2019-11-08: 10 mg via INTRAVENOUS
  Administered 2019-11-08: 5 mg via INTRAVENOUS
  Administered 2019-11-08: 10 mg via INTRAVENOUS
  Administered 2019-11-08: 5 mg via INTRAVENOUS

## 2019-11-08 MED ORDER — HYDRALAZINE HCL 20 MG/ML IJ SOLN
5.0000 mg | INTRAMUSCULAR | Status: AC | PRN
  Administered 2019-11-08: 5 mg via INTRAVENOUS
  Filled 2019-11-08: qty 1

## 2019-11-08 MED ORDER — HEPARIN SODIUM (PORCINE) 1000 UNIT/ML DIALYSIS: 1000.0000 [IU] | INTRAMUSCULAR | Status: DC | PRN

## 2019-11-08 MED ORDER — PENTAFLUOROPROP-TETRAFLUOROETH EX AERO: 1.0000 "application " | INHALATION_SPRAY | CUTANEOUS | Status: DC | PRN

## 2019-11-08 MED ORDER — GLYCOPYRROLATE PF 0.2 MG/ML IJ SOSY
PREFILLED_SYRINGE | INTRAMUSCULAR | Status: AC
  Filled 2019-11-08: qty 2

## 2019-11-08 MED ORDER — HEMOSTATIC AGENTS (NO CHARGE) OPTIME
TOPICAL | Status: DC | PRN
  Administered 2019-11-08 (×4): 1 via TOPICAL

## 2019-11-08 MED ORDER — ACETAMINOPHEN 325 MG RE SUPP: 325.0000 mg | RECTAL | Status: DC | PRN

## 2019-11-08 MED ORDER — INSULIN REGULAR(HUMAN) IN NACL 100-0.9 UT/100ML-% IV SOLN
INTRAVENOUS | Status: DC | PRN
  Administered 2019-11-08: 10 [IU]/h via INTRAVENOUS

## 2019-11-08 MED ORDER — ACETAMINOPHEN 10 MG/ML IV SOLN
INTRAVENOUS | Status: AC
  Filled 2019-11-08: qty 100

## 2019-11-08 MED ORDER — NEOSTIGMINE METHYLSULFATE 10 MG/10ML IV SOLN
INTRAVENOUS | Status: DC | PRN
Start: 2019-11-08 — End: 2019-11-08
  Administered 2019-11-08: 5 mg via INTRAVENOUS

## 2019-11-08 MED ORDER — SODIUM BICARBONATE 8.4 % IV SOLN
INTRAVENOUS | Status: DC | PRN
  Administered 2019-11-08 (×2): 25 meq via INTRAVENOUS

## 2019-11-08 MED ORDER — LABETALOL HCL 5 MG/ML IV SOLN
10.0000 mg | INTRAVENOUS | Status: AC | PRN
  Administered 2019-11-08 (×3): 10 mg via INTRAVENOUS
  Filled 2019-11-08: qty 4

## 2019-11-08 MED ORDER — METOPROLOL TARTRATE 5 MG/5ML IV SOLN
INTRAVENOUS | Status: DC | PRN
  Administered 2019-11-08 (×2): 2.5 mg via INTRAVENOUS

## 2019-11-08 MED ORDER — SODIUM BICARBONATE 8.4 % IV SOLN
100.0000 meq | Freq: Once | INTRAVENOUS | Status: AC
  Administered 2019-11-08: 100 meq via INTRAVENOUS
  Filled 2019-11-08: qty 100

## 2019-11-08 MED ORDER — SODIUM CHLORIDE 0.9 % IV SOLN
1.0000 g | Freq: Once | INTRAVENOUS | Status: AC
  Administered 2019-11-08: 1 g via INTRAVENOUS
  Filled 2019-11-08: qty 10

## 2019-11-08 MED ORDER — SODIUM CHLORIDE 0.9 % IV SOLN
10.0000 ug/kg/min | INTRAVENOUS | Status: AC
  Administered 2019-11-08: 10 ug/kg/min via INTRAVENOUS
  Filled 2019-11-08 (×2): qty 2

## 2019-11-08 MED ORDER — VECURONIUM BROMIDE 10 MG IV SOLR
INTRAVENOUS | Status: AC
  Filled 2019-11-08: qty 10

## 2019-11-08 MED ORDER — METOPROLOL TARTRATE 5 MG/5ML IV SOLN
INTRAVENOUS | Status: AC
  Filled 2019-11-08: qty 5

## 2019-11-08 MED ORDER — MIDAZOLAM HCL 2 MG/2ML IJ SOLN
INTRAMUSCULAR | Status: DC | PRN
  Administered 2019-11-08 (×2): 1 mg via INTRAVENOUS

## 2019-11-08 MED ORDER — ALBUMIN HUMAN 5 % IV SOLN: INTRAVENOUS | Status: DC | PRN

## 2019-11-08 MED ORDER — OXYCODONE HCL 5 MG PO TABS: 5.0000 mg | ORAL_TABLET | Freq: Once | ORAL | Status: DC | PRN

## 2019-11-08 MED ORDER — SODIUM CHLORIDE (PF) 0.9 % IJ SOLN
INTRAMUSCULAR | Status: AC
  Filled 2019-11-08: qty 20

## 2019-11-08 MED ORDER — ONDANSETRON HCL 4 MG/2ML IJ SOLN
INTRAMUSCULAR | Status: DC | PRN
  Administered 2019-11-08: 4 mg via INTRAVENOUS

## 2019-11-08 MED ORDER — POTASSIUM CHLORIDE CRYS ER 20 MEQ PO TBCR: 20.0000 meq | EXTENDED_RELEASE_TABLET | Freq: Once | ORAL | Status: DC | PRN

## 2019-11-08 MED ORDER — LIDOCAINE HCL (PF) 1 % IJ SOLN: 5.0000 mL | INTRAMUSCULAR | Status: DC | PRN

## 2019-11-08 MED ORDER — FENTANYL CITRATE (PF) 250 MCG/5ML IJ SOLN
INTRAMUSCULAR | Status: AC
  Filled 2019-11-08: qty 5

## 2019-11-08 MED ORDER — ORAL CARE MOUTH RINSE
15.0000 mL | Freq: Two times a day (BID) | OROMUCOSAL | Status: DC
  Administered 2019-11-08 – 2019-11-09 (×3): 15 mL via OROMUCOSAL

## 2019-11-08 MED ORDER — PHENOL 1.4 % MT LIQD: 1.0000 | OROMUCOSAL | Status: DC | PRN

## 2019-11-08 MED ORDER — GLYCOPYRROLATE PF 0.2 MG/ML IJ SOSY
PREFILLED_SYRINGE | INTRAMUSCULAR | Status: AC
  Filled 2019-11-08: qty 1

## 2019-11-08 SURGICAL SUPPLY — 64 items
ADH SKN CLS APL DERMABOND .7 (GAUZE/BANDAGES/DRESSINGS) ×3
AGENT HMST SPONGE THK3/8 (HEMOSTASIS)
BLADE CLIPPER SURG (BLADE) ×3 IMPLANT
CANISTER SUCT 3000ML PPV (MISCELLANEOUS) ×3 IMPLANT
CLIP VESOCCLUDE MED 24/CT (CLIP) ×5 IMPLANT
CLIP VESOCCLUDE SM WIDE 24/CT (CLIP) ×3 IMPLANT
DERMABOND ADVANCED (GAUZE/BANDAGES/DRESSINGS) ×6
DERMABOND ADVANCED .7 DNX12 (GAUZE/BANDAGES/DRESSINGS) IMPLANT
ELECT BLADE 4.0 EZ CLEAN MEGAD (MISCELLANEOUS) ×3
ELECT BLADE 6.5 EXT (BLADE) IMPLANT
ELECT CAUTERY BLADE 6.4 (BLADE) ×2 IMPLANT
ELECT REM PT RETURN 9FT ADLT (ELECTROSURGICAL) ×3
ELECTRODE BLDE 4.0 EZ CLN MEGD (MISCELLANEOUS) ×1 IMPLANT
ELECTRODE REM PT RTRN 9FT ADLT (ELECTROSURGICAL) ×1 IMPLANT
FELT TEFLON 1X6 (MISCELLANEOUS) ×3 IMPLANT
GAUZE 4X4 16PLY RFD (DISPOSABLE) ×2 IMPLANT
GLOVE BIO SURGEON STRL SZ 6.5 (GLOVE) ×4 IMPLANT
GLOVE BIO SURGEON STRL SZ7.5 (GLOVE) ×9 IMPLANT
GLOVE BIO SURGEONS STRL SZ 6.5 (GLOVE) ×4
GLOVE BIOGEL PI IND STRL 6.5 (GLOVE) IMPLANT
GLOVE BIOGEL PI IND STRL 7.0 (GLOVE) IMPLANT
GLOVE BIOGEL PI IND STRL 8 (GLOVE) ×1 IMPLANT
GLOVE BIOGEL PI INDICATOR 6.5 (GLOVE) ×4
GLOVE BIOGEL PI INDICATOR 7.0 (GLOVE) ×2
GLOVE BIOGEL PI INDICATOR 8 (GLOVE) ×2
GOWN STRL REUS W/ TWL LRG LVL3 (GOWN DISPOSABLE) ×3 IMPLANT
GOWN STRL REUS W/ TWL XL LVL3 (GOWN DISPOSABLE) ×1 IMPLANT
GOWN STRL REUS W/TWL LRG LVL3 (GOWN DISPOSABLE) ×18
GOWN STRL REUS W/TWL XL LVL3 (GOWN DISPOSABLE) ×3
GRAFT HEMASHIELD 20X10 (Vascular Products) ×2 IMPLANT
HEMOSTAT SNOW SURGICEL 2X4 (HEMOSTASIS) ×8 IMPLANT
HEMOSTAT SPONGE AVITENE ULTRA (HEMOSTASIS) IMPLANT
INSERT FOGARTY 61MM (MISCELLANEOUS) ×6 IMPLANT
INSERT FOGARTY SM (MISCELLANEOUS) ×12 IMPLANT
KIT BASIN OR (CUSTOM PROCEDURE TRAY) ×3 IMPLANT
KIT TURNOVER KIT B (KITS) ×3 IMPLANT
LOOP VESSEL MAXI BLUE (MISCELLANEOUS) ×2 IMPLANT
LOOP VESSEL MINI RED (MISCELLANEOUS) ×8 IMPLANT
NS IRRIG 1000ML POUR BTL (IV SOLUTION) ×8 IMPLANT
PACK AORTA (CUSTOM PROCEDURE TRAY) ×3 IMPLANT
PAD ARMBOARD 7.5X6 YLW CONV (MISCELLANEOUS) ×6 IMPLANT
PENCIL BUTTON HOLSTER BLD 10FT (ELECTRODE) ×2 IMPLANT
RETAINER VISCERA MED (MISCELLANEOUS) ×3 IMPLANT
SPONGE LAP 18X18 RF (DISPOSABLE) ×2 IMPLANT
STAPLER VISISTAT 35W (STAPLE) ×6 IMPLANT
SUT ETHIBOND 5 LR DA (SUTURE) IMPLANT
SUT MNCRL AB 4-0 PS2 18 (SUTURE) ×8 IMPLANT
SUT PDS AB 1 TP1 96 (SUTURE) ×8 IMPLANT
SUT PROLENE 2 0 SH DA (SUTURE) ×6 IMPLANT
SUT PROLENE 3 0 SH1 36 (SUTURE) ×7 IMPLANT
SUT PROLENE 5 0 C 1 24 (SUTURE) ×12 IMPLANT
SUT PROLENE 5 0 C 1 36 (SUTURE) ×2 IMPLANT
SUT PROLENE 6 0 CC (SUTURE) ×4 IMPLANT
SUT SILK 2 0 SH CR/8 (SUTURE) ×3 IMPLANT
SUT VIC AB 2-0 CT1 27 (SUTURE) ×6
SUT VIC AB 2-0 CT1 36 (SUTURE) ×6 IMPLANT
SUT VIC AB 2-0 CT1 TAPERPNT 27 (SUTURE) IMPLANT
SUT VIC AB 3-0 SH 27 (SUTURE) ×18
SUT VIC AB 3-0 SH 27X BRD (SUTURE) IMPLANT
TOWEL GREEN STERILE (TOWEL DISPOSABLE) ×3 IMPLANT
TOWEL GREEN STERILE FF (TOWEL DISPOSABLE) ×2 IMPLANT
TOWEL SURG RFD BLUE STRL DISP (DISPOSABLE) ×8 IMPLANT
TRAY FOLEY MTR SLVR 16FR STAT (SET/KITS/TRAYS/PACK) ×3 IMPLANT
WATER STERILE IRR 1000ML POUR (IV SOLUTION) ×6 IMPLANT

## 2019-11-08 NOTE — Op Note (Signed)
Procedure: Bifemoral bypass for repair of abdominal aortic aneurysm  Preoperative diagnosis: Abdominal aortic aneurysm  Postoperative diagnosis: Same  Anesthesia: General  Co-surgeon: Clark MD  Operative findings: 20 x 10 mm dacron graft  Operative details: Patient was taken the operating room.  I acted as co-surgeon with Dr. Carlis Abbott.  I assisted in the exposure of a difficult aortic neck that required extensive mobilization of the renal vein ligation of this structure as well as exposure of the superior mesenteric and both renal arteries.  Also acted as co-surgeon for obstruction of the proximal anastomosis exposure of the remainder of abdominal aorta.  I also was responsible for the left femoral anastomosis.  My role as co-surgeon expedited the procedure.  Ruta Hinds, MD [facility] [place] [facility] Office: [phone]

## 2019-11-08 NOTE — Op Note (Signed)
Date: Nov 08, 2019  Preoperative diagnosis:  1. 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 Marty Heck, MD  Co-surgeon: Dr. Ruta Hinds, MD  Indications: Patient is a 69 year old male with end-stage renal disease that was seen for evaluation of abdominal aortic aneurysm.  Ultimately CT imaging showed that this was greater than 6 cm and unfortunately he was not a candidate for stent graft repair given juxtarenal location and the aorta being degenerated to just below the level of the SMA.  He presents today for planned open repair with a bifurcated graft after risks and benefits are discussed.  Aortobifemoral bypass was performed given significant occlusive disease in both iliac arteries, it was felt the graft would be more durable by going to the common femorals bilaterally.  Findings: Proximal graft sewn using an infrarenal clamp just below the level of the SMA since the renal came off almost at the same level as the SMA.  Ultimately a felt strip was used for the proximal anastomosis and this included buttressing posterior ulcerated segment in the neck that was degenerated.  A end to end proximal anastomosis was performed.  Ultimately bifurcated graft was tunneled under both ureters and sewn to the common femoral arteries in both groins with ligation of both common iliac arteries.  He had brisk posterior tibial Doppler signals at completion of the case.  Anesthesia: General  Resuscitation: 1900 crystalloid, 500 albumin, 3 u pRBC, 2 u FFP, 380 cell saver  Details: Patient was taken to the operating room after informed consent was obtained.  Placed on the operative table supine position general endotracheal anesthesia was induced.  Patient got preoperative antibiotics.  Preop  timeout was performed to identify patient, procedure and site.  Initially vertical incisions were made in both groins over the common femoral arteries.  Dr. Eden Lathe started in the left groin and I started in the right groin and ultimately the common femoral artery as well as the SFA and profunda were dissected out and controlled with Vesseloops.  These arteries were very soft with no significant disease.  In each groin we dissected under the inguinal ligament and got control of the circumflex vein branch that was then tied off with 2-0 silk ties and divided.  Tunnels were made over top of both external iliac arteries into the pelvis bluntly. Turned our attention to the abdomen where a midline incision was made from the xiphoid to just above the pubis.  Ultimately subcutaneous tissue was divided and the midline fascia was divided with Bovie cautery and the peritoneal cavity was entered.  We explored all quadrants of the peritoneal cavity and there was no abnormal findings.  Anesthesia placed an NG tube which we were able to palpate in the stomach.  The transverse colon was then moved cephalad and the small bowel was eviscerated in the right upper quadrant.  Ultimately we mobilized the duodenum to the right by dividing the retroperitoneum between the duodenum and the IMV.  The IMV was ligated between two 2-0 silk ties and divided.  Ultimately we dissected down to the aneurysm and it was dissected on its anterior and lateral walls.  We moved cephalad until we identified the renal vein.  That point time fixed retractor was placed with body walls on both sides and splanchnic retractor cephalad and fence in  the right upper quadrant.  Ultimately the renal vein was circumferentially mobilized and then clamped with Henley clamps and divided in order to give adequate exposure of the neck of the aneurysm.  Each end was then oversewn with 5-0 Prolene in running format.  We continued to mobilize cranially until we could get to  the base of the SMA and identify the right and left renal arteries that came off essentially at the level of the SMA.  We were then able to get control behind the neck of the aneurysm and able to pass a umbilical tape.  We then carried our dissection caudal and the IMA was circumferentially dissected and controlled with a small loop and we got control of both common iliac arteries bilaterally and got Vesseloops around each.  Then created tunnels over both common iliac arteries under the ureter to the groin and passed nontraumatic clamp and passed umbilical tapes for our tunnels.  That point in time the patient was given 100 units/kg heparin.  ACTs were checked to maintain greater than 250.  Ultimately we clamped the common iliacs first with our vessel loops as well as additional Fogarty clamps.  The aneurysm was controlled proximally by clamping just below the renals and SMA with a DeBakey aortic clamp.  That point in time the aorta was open longitudinally with Bovie cautery.  All the thrombus within the aneurysm was then removed and passed off the field.  There were no overt lumbars backbleeding need to be repaired.  We then basically cut the neck and the aneurysm for a nice sewing ring.  Felt strip was brought on the field as well as a 20 mm x 10 mm bifurcated Dacron graft.  An end-to-end anastomosis was then performed with 3-0 Prolene in running fashion using the felt strip to the infra-renal aorta.  We buttressed an area of degenerated aorta posteriorly with large bites on the neck.  Once we completed the anastomosis and came off clamp we had a good seal proximally.  Both limbs were flushed with good pulsatile inflow.  We then tunneled each limb under the ureter over the iliac arteries into both groins.  Dr. Oneida Alar worked in left groin while worked in the right groin and ultimately end to side anastomosis was performed from the bifurcated limb of the graft onto the common femoral artery in end-to-side fashion  once it was pulled to the appropriate length.  These anastomosis was sewn with 5-0 Prolene after the common femorals was opened in longitudinal fashion with 11 blade scalpel extended with Potts scissors.  Both limbs were de-aired as well as making sure he had adequate backbleeding prior to completion of each groin anastomosis.  We then checked the feet and he had posterior tibial signals.  Patient was given protamine for reversal and we used Surgicel snow for hemostasis.  The peritoneal cavity was then copiously irrigated and we were very careful to ensure a good hemostasis proximally.  One additional pledgeted suture with 3-0 Prolene was placed in the proximal anastomosis.  Distally there was no backbleeding from the iliac arteries and we appear to have good hemostasis after ligated both common iliac arteries.  The aneurysm sac was then closed with a running 3-0 Vicryl over top of the bifurcated Dacron graft.  Then closed the retroperitoneum with a running 3-0 Vicryl.  The midline fascia was closed with a #1 double-stranded PDS.  Ultimately the skin was closed with 4-0 Monocryl Dermabond.  Both groins were then copiously irrigated and  closed in multiple layers of 2-0 Vicryl, 3-0 Vicryl, 4-0 Monocryl and Dermabond.  Patient had very brisk posterior tibial signals in both feet at the completion of the case.  Extubated and taken to PACU in stable condition.   Complications: None  Condition: Stable  Marty Heck, MD [facility] [place] Office: [place]

## 2019-11-08 NOTE — Anesthesia Procedure Notes (Signed)
Arterial Line Insertion Start/End05/23/2021 7:01 AM, 11/28/2019 7:01 AM Performed by: Kathryne Hitch, CRNA, CRNA  Patient location: Pre-op. Preanesthetic checklist: patient identified, IV checked, site marked, risks and benefits discussed, surgical consent, monitors and equipment checked, pre-op evaluation and timeout performed Lidocaine 1% used for infiltration Right, radial was placed Catheter size: 20 Fr Hand hygiene performed  and maximum sterile barriers used   Attempts: 1 Procedure performed without using ultrasound guided technique. Following insertion, dressing applied and Biopatch. Post procedure assessment: normal and unchanged  Patient tolerated the procedure well with no immediate complications.

## 2019-11-08 NOTE — Transfer of Care (Signed)
Immediate Anesthesia Transfer of Care Note  Patient: AMEN STASZAK  Procedure(s) Performed: OPEN JUXTARENAL ABDOMINAL AORTIC ANEURYSM REPAIR using 68mm x 16mm BIFURCATED HEMASHIELD GOLD GRAFT (N/A Abdomen)  Patient Location: PACU  Anesthesia Type:General  Level of Consciousness: drowsy and patient cooperative  Airway & Oxygen Therapy: Patient Spontanous Breathing and Patient connected to face mask oxygen  Post-op Assessment: Report given to RN and Post -op Vital signs reviewed and stable  Post vital signs: Reviewed and stable  Last Vitals:  Vitals Value Taken Time  BP 159/63 11/29/2019 1333  Temp    Pulse 71 11/19/2019 1335  Resp 21 11/21/2019 1335  SpO2 99 % 11/06/2019 1335  Vitals shown include unvalidated device data.  Last Pain: There were no vitals filed for this visit.    Patients Stated Pain Goal: 2 (43/[id number]/91 [id number])  Complications: No apparent anesthesia complications

## 2019-11-08 NOTE — Anesthesia Procedure Notes (Signed)
Procedure Name: Intubation Date/Time: 11/28/2019 8:41 AM Performed by: Kathryne Hitch, CRNA Pre-anesthesia Checklist: Patient identified, Emergency Drugs available, Suction available and Patient being monitored Patient Re-evaluated:Patient Re-evaluated prior to induction Oxygen Delivery Method: Circle system utilized Preoxygenation: Pre-oxygenation with 100% oxygen Induction Type: IV induction Ventilation: Mask ventilation without difficulty Laryngoscope Size: Miller and 3 Grade View: Grade I Tube type: Oral Tube size: 7.5 mm Number of attempts: 1 Airway Equipment and Method: Stylet and Oral airway Placement Confirmation: ETT inserted through vocal cords under direct vision,  positive ETCO2 and breath sounds checked- equal and bilateral Secured at: 22 cm Tube secured with: Tape Dental Injury: Teeth and Oropharynx as per pre-operative assessment

## 2019-11-08 NOTE — H&P (Signed)
History and Physical Interval Note:  11/07/2019 7:26 AM  Juan Bass  has presented today for surgery, with the diagnosis of ABDOMINAL AORTIC ANEURYSM.  The various methods of treatment have been discussed with the patient and family. After consideration of risks, benefits and other options for treatment, the patient has consented to  Procedure(s): OPEN ANEURYSM ABDOMINAL AORTIC REPAIR (N/A) as a surgical intervention.  The patient's history has been reviewed, patient examined, no change in status, stable for surgery.  I have reviewed the patient's chart and labs.  Questions were answered to the patient's satisfaction.     As previously discussed he has a very large complex abdominal aneurysm that is degenerated up to his SMA.  I do not think he has any options for stent graft repair and this will be an open repair.  I have reviewed imaging with multiple partnerss.  He did see cardiology and does not look like they have any concerns and have cleared him to proceed with surgery.  Likely will be a aortobifemoral bypass to the groins.  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  I discussed all the risks and benefits with the patient including high risk for major complication and or death.   Marty Heck  Patient name: Juan Bass MRN: [id number] DOB: August 04, 1950 Sex: male  REASON FOR CONSULT: F/U after CTA abdomen/pelvis to discuss repair of abdominal aortic aneurysm  HPI:  Juan Bass is a 69 y.o. male, with history of hypertension, DM, and ESRD (dialysis M/W/F) that presents to discuss 5.5 cm AAA after CTA. As previously noted, he had a CT abdomen pelvis without contrast on 03/18/2019 and this noted aneurysm was 5.5 cm. No abdominal or back pain. Still smoking at least a pack a day. He denies any previous surgical history in his abdomen. He was evaluated for  transplant given his end-stage renal disease and was not a candidate due to his aneurysm and then referred back to [facility] for further evaluation by nephrology.  Patient is well-known to our practice and has had a left brachiocephalic AV fistula by Dr. Trula Slade for access and I have performed fistulogram and intervention on this. He has also had his right iliac stenting in the setting of remote claudication.  I sent him for contrasted study with CTA to further evaluate options for repair.      Past Medical History:  Diagnosis Date  . AAA (abdominal aortic aneurysm) ([place])   . Chronic kidney disease   . Diabetes mellitus without complication ([place])   . Hypertension         Past Surgical History:  Procedure Laterality Date  . A/V FISTULAGRAM Left 10/14/2018   Procedure: A/V FISTULAGRAM; Surgeon: Marty Heck, MD; Location: [facility]; Service: Cardiovascular; Laterality: Left; arm  . ABDOMINAL AORTAGRAM N/A 02/22/2014   Procedure: ABDOMINAL Maxcine Ham; Surgeon: Serafina Mitchell, MD; Location: [facility] CATH LAB; Service: Cardiovascular; Laterality: N/A;  . AV FISTULA PLACEMENT Left 08/06/2018   Procedure: ARTERIOVENOUS (AV) FISTULA CREATION; Surgeon: Serafina Mitchell, MD; Location: [facility]; Service: Vascular; Laterality: Left;  . BACK SURGERY    . PERIPHERAL VASCULAR BALLOON ANGIOPLASTY Left 10/14/2018   Procedure: PERIPHERAL VASCULAR BALLOON ANGIOPLASTY; Surgeon: Marty Heck, MD; Location: [facility]; Service: Cardiovascular; Laterality: Left; ARM FISTULA  . SPINE SURGERY  Family History  Problem Relation Age of Onset  . Hypertension Mother   . Deep vein thrombosis Father   . Hypertension Father   . Hypertension Sister   . Hypertension Brother    SOCIAL HISTORY:  Social History        Socioeconomic History  . Marital status: Married    Spouse name: Not on file  . Number of children: Not on file  . Years of education: Not on file  . Highest education level:  Not on file  Occupational History  . Not on file  Tobacco Use  . Smoking status: Current Every Day Smoker    Packs/day: 0.50    Years: 40.00    Pack years: 20.00    Types: Cigarettes    Last attempt to quit: 07/24/2018    Years since quitting: 1.1  . Smokeless tobacco: Never Used  Substance and Sexual Activity  . Alcohol use: Yes    Alcohol/week: 7.0 standard drinks    Types: 7 Cans of beer per week    Comment: 1 can of beer daily  . Drug use: No  . Sexual activity: Not on file  Other Topics Concern  . Not on file  Social History Narrative  . Not on file   Social Determinants of Health      Financial Resource Strain: Low Risk   . Difficulty of Paying Living Expenses: Not very hard  Food Insecurity: No Food Insecurity  . Worried About Charity fundraiser in the Last Year: Never true  . Ran Out of Food in the Last Year: Never true  Transportation Needs: No Transportation Needs  . Lack of Transportation (Medical): No  . Lack of Transportation (Non-Medical): No  Physical Activity: Insufficiently Active  . Days of Exercise per Week: 3 days  . Minutes of Exercise per Session: 30 min  Stress: No Stress Concern Present  . Feeling of Stress : Only a little  Social Connections: Slightly Isolated  . Frequency of Communication with Friends and Family: More than three times a week  . Frequency of Social Gatherings with Friends and Family: More than three times a week  . Attends Religious Services: 1 to 4 times per year  . Active Member of Clubs or Organizations: No  . Attends Archivist Meetings: Never  . Marital Status: Married  Human resources officer Violence: Not At Risk  . Fear of Current or Ex-Partner: No  . Emotionally Abused: No  . Physically Abused: No  . Sexually Abused: No   No Known Allergies        Current Outpatient Medications  Medication Sig Dispense Refill  . acetaminophen (TYLENOL) 325 MG tablet Take 2 tablets (650 mg total) by mouth every 6 (six) hours  as needed for mild pain (or Fever >/= 101). 12 tablet 0  . amLODipine (NORVASC) 10 MG tablet Take 1 tablet (10 mg total) by mouth daily. 30 tablet 3  . aspirin EC 81 MG tablet Take 1 tablet (81 mg total) by mouth daily with breakfast. 30 tablet 2  . atorvastatin (LIPITOR) 40 MG tablet Take 1 tablet (40 mg total) by mouth daily. 30 tablet 2  . Cholecalciferol (VITAMIN D3) 50 MCG (2000 UT) TABS Take 2,000 Units by mouth 4 (four) times a week.    Marland Kitchen glipiZIDE (GLUCOTROL XL) 2.5 MG 24 hr tablet Take 1 tablet (2.5 mg total) by mouth daily with breakfast. 60 tablet 2  . hydrALAZINE (APRESOLINE) 25 MG tablet Take 1 tablet (25  mg total) by mouth 3 (three) times daily. For BP 90 tablet 3  . labetalol (NORMODYNE) 200 MG tablet Take 2 tablets (400 mg total) by mouth 2 (two) times daily. 120 tablet 3  . levothyroxine (SYNTHROID) 50 MCG tablet Take 1 tablet (50 mcg total) by mouth daily before breakfast. 30 tablet 3  . ondansetron (ZOFRAN ODT) 4 MG disintegrating tablet Take 1 tablet (4 mg total) by mouth every 8 (eight) hours as needed for nausea or vomiting. 20 tablet 0   No current facility-administered medications for this visit.   REVIEW OF SYSTEMS:  [X]  denotes positive finding, [ ]  denotes negative finding  Cardiac  Comments:  Chest pain or chest pressure:    Shortness of breath upon exertion:    Short of breath when lying flat:    Irregular heart rhythm:        Vascular    Pain in calf, thigh, or hip brought on by ambulation:    Pain in feet at night that wakes you up from your sleep:     Blood clot in your veins:    Leg swelling:         Pulmonary    Oxygen at home:    Productive cough:     Wheezing:         Neurologic    Sudden weakness in arms or legs:     Sudden numbness in arms or legs:     Sudden onset of difficulty speaking or slurred speech:    Temporary loss of vision in one eye:     Problems with dizziness:         Gastrointestinal    Blood in stool:     Vomited blood:          Genitourinary    Burning when urinating:     Blood in urine:        Psychiatric    Major depression:         Hematologic    Bleeding problems:    Problems with blood clotting too easily:        Skin    Rashes or ulcers:        Constitutional    Fever or chills:    PHYSICAL EXAM:     Vitals:   09/21/19 1512  BP: 128/72  Pulse: 81  Resp: 16  Temp: (!) 97.3 F (36.3 C)  TempSrc: Temporal  SpO2: 99%  Weight: 163 lb (73.9 kg)  Height: 5\' 10"  (1.778 m)   GENERAL: The patient is a well-nourished male, in no acute distress. The vital signs are documented above.  CARDIAC: There is a regular rate and rhythm.  VASCULAR:  Left brachiocephalic AVF with good thrill  Palpable femoral pulses bilaterally  PULMONARY: There is good air exchange bilaterally without wheezing or rales.  ABDOMEN: Soft and non-tender with normal pitched bowel sounds.  Umbilical hernia  No pain with palpation of aneurysm.  MUSCULOSKELETAL: There are no major deformities or cyanosis.  NEUROLOGIC: No focal weakness or paresthesias are detected.  SKIN: There are no ulcers or rashes noted.  PSYCHIATRIC: The patient has a normal affect.  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 Essentially the aorta is degenerated up to the level of the SMA and right renal artery. Left renal comes off posterior and superior to SMA. His right iliac stent looks widely patent.  Assessment/Plan:  69 year old male with end-stage renal  disease, diabetes and hypertension that was initially evaluated for asymptomatic 5.5 cm abdominal aortic aneurysm. I discussed with him and his sister in detail that after review of his CTA to evaluate options for repair this shows that his aneurysm has increased to 6.1 cm. This is a juxtarenal aneurysm and degenerated up to the level of his SMA. I do not see any options for endovascular repair and we even evaluated covering his  renal arteries with stent graft given his end-stage renal. I think he is likely going to need open repair with tube graft and and I did quote him and his sister 33% chance of major complication. Ultimately patient and his sister state they need some time to think about this. They would like to proceed with getting an echocardiogram and cardiac clearance. He denies any chest pain or shortness of breath and has no cardiologist at this time. I will follow-up with him later this week or early next week by phone to see what he has decided about proceeding with repair. I did quote them a 15% rupture risk/year at this time. He understands this would be high cardiac risk operation and difficult repair given juxtarenal location.  Marty Heck, MD  [facility]  Office: [phone]

## 2019-11-08 NOTE — Progress Notes (Signed)
  Day of Surgery Note    Subjective:  Sleepy but follows commands   Vitals:   12/04/2019 1400 11/21/2019 1415  BP: 116/61 113/60  Pulse: 65 65  Resp: 18 17  Temp:    SpO2: 99% 99%    Incisions:   Midline and bilateral groins are clean and dry Extremities:  +doppler signals right PT/peroneal and left DP/PT; motor in tact Cardiac:  regular Lungs:  Non labored Abdomen:  soft   Assessment/Plan:  This is a 69 y.o. male who is s/p  Open AAA repair-aortobifemoral bypass grafting  -pt doing well in recovery-he has +doppler signals right PT/peroneal and left DP/PT; motor in tact -pt with ESRD with hyperkalemia-for dialysis in the unit this afternoon per RN report   Leontine Locket, PA-C 11/29/2019 2:32 PM [phone]

## 2019-11-08 NOTE — Consult Note (Addendum)
[facility] Nephrology Consultation Note  Requesting MD: Dr Carlis Abbott, C Reason for consult: ESRD on HD  HPI:  Juan Bass is a 69 y.o. male with history of HTN, DM, PAD, AAA, ESRD on HD, is status post repair of abdominal aneurysm surgery today seen as a consultation for the management of ESRD. Patient was seen and examined in PACU.  He is still quite sedated under the effect of anesthesia.  Review of system and detail information unable to obtain from the patient. He gets dialysis at [facility] via left upper extremity AV fistula. He was admitted for AAA surgery.  He underwent juxta renal abdominal aortic aneurysm repair with aortobifemoral bypass using graft and ligation of bilateral common iliac arteries.  The initial labs showed potassium level of 4.3.  Patient received 3 units of red blood cell transfusion and then repeat potassium level elevated to 7.4.   The blood pressure is in acceptable range.  Using 2 L of oxygen with acceptable oxygen saturation. Unable to obtain ROS.  PMHx:   Past Medical History:  Diagnosis Date  . AAA (abdominal aortic aneurysm) ([place])   . Chronic kidney disease   . Diabetes mellitus without complication ([place])   . Hypertension   . Hypothyroidism   . PAD (peripheral artery disease) ([facility])    right EIA stent 02/22/14    Past Surgical History:  Procedure Laterality Date  . A/V FISTULAGRAM Left 10/14/2018   Procedure: A/V FISTULAGRAM;  Surgeon: Marty Heck, MD;  Location: [facility];  Service: Cardiovascular;  Laterality: Left;  arm  . ABDOMINAL AORTAGRAM N/A 02/22/2014   Procedure: ABDOMINAL Maxcine Ham;  Surgeon: Serafina Mitchell, MD;  Location: [facility] CATH LAB;  Service: Cardiovascular;  Laterality: N/A;  . AV FISTULA PLACEMENT Left 08/06/2018   Procedure: ARTERIOVENOUS (AV) FISTULA CREATION;  Surgeon: Serafina Mitchell, MD;  Location: [facility];  Service: Vascular;  Laterality: Left;  . BACK SURGERY    . PERIPHERAL VASCULAR  BALLOON ANGIOPLASTY Left 10/14/2018   Procedure: PERIPHERAL VASCULAR BALLOON ANGIOPLASTY;  Surgeon: Marty Heck, MD;  Location: [facility];  Service: Cardiovascular;  Laterality: Left;  ARM FISTULA  . SPINE SURGERY      Family Hx:  Family History  Problem Relation Age of Onset  . Hypertension Mother   . Deep vein thrombosis Father   . Hypertension Father   . Hypertension Sister   . Hypertension Brother     Social History:  reports that he has been smoking cigarettes. He has a 20.00 pack-year smoking history. He has never used smokeless tobacco. He reports current alcohol use of about 1.0 standard drinks of alcohol per week. He reports that he does not use drugs.  Allergies: No Known Allergies  Medications: Prior to Admission medications   Medication Sig Start Date End Date Taking? Authorizing Provider  aspirin EC 81 MG tablet Take 1 tablet (81 mg total) by mouth daily with breakfast. 03/19/19  Yes Emokpae, Courage, MD  Cholecalciferol (VITAMIN D3) 50 MCG (2000 UT) TABS Take 2,000 Units by mouth 4 (four) times a week.   Yes [provider]  glipiZIDE (GLUCOTROL) 5 MG tablet Take 5 mg by mouth 2 (two) times daily. 10/19/19  Yes [provider]  labetalol (NORMODYNE) 200 MG tablet Take 2 tablets (400 mg total) by mouth 2 (two) times daily. Patient taking differently: Take 200 mg by mouth 2 (two) times daily.  03/19/19  Yes Roxan Hockey, MD  levothyroxine (SYNTHROID) 50  MCG tablet Take 1 tablet (50 mcg total) by mouth daily before breakfast. 03/19/19  Yes Emokpae, Courage, MD  acetaminophen (TYLENOL) 325 MG tablet Take 2 tablets (650 mg total) by mouth every 6 (six) hours as needed for mild pain (or Fever >/= 101). 03/19/19   Roxan Hockey, MD    I have reviewed the patient's current medications.  Labs:  Results for orders placed or performed during the hospital encounter of 11/21/2019 (from the past 48 hour(s))  Glucose, capillary     Status: None    Collection Time: 11/09/2019  6:27 AM  Result Value Ref Range   Glucose-Capillary 98 70 - 99 mg/dL    Comment: Glucose reference range applies only to samples taken after fasting for at least 8 hours.  Prepare RBC (crossmatch)     Status: None   Collection Time: 12/06/2019  6:28 AM  Result Value Ref Range   Order Confirmation      ORDER PROCESSED BY BLOOD BANK Performed at [facility], [place] [address], [place], [place] [address]   I-STAT, [place] 8     Status: Abnormal   Collection Time: 11/07/2019  7:05 AM  Result Value Ref Range   Sodium 139 135 - 145 mmol/L   Potassium 4.0 3.5 - 5.1 mmol/L   Chloride 102 98 - 111 mmol/L   BUN 38 (H) 8 - 23 mg/dL   Creatinine, Ser 8.20 (H) 0.61 - 1.24 mg/dL   Glucose, Bld 90 70 - 99 mg/dL    Comment: Glucose reference range applies only to samples taken after fasting for at least 8 hours.   Calcium, Ion 1.15 1.15 - 1.40 mmol/L   TCO2 25 22 - 32 mmol/L   Hemoglobin 9.2 (L) 13.0 - 17.0 g/dL   HCT 27.0 (L) 39.0 - 52.0 %  Prepare fresh frozen plasma     Status: None (Preliminary result)   Collection Time: 12/06/2019  7:08 AM  Result Value Ref Range   Unit Number [id number]    Blood Component Type THW PLS APHR    Unit division B0    Status of Unit ISSUED    Transfusion Status OK TO TRANSFUSE    Unit Number [id number]    Blood Component Type THW PLS APHR    Unit division A0    Status of Unit ISSUED    Transfusion Status      OK TO TRANSFUSE Performed at [facility], [address], [place], [place] [address]   I-STAT 7, (LYTES, BLD GAS, ICA, H+H)     Status: Abnormal   Collection Time: 12/02/2019  8:57 AM  Result Value Ref Range   pH, Arterial 7.446 7.350 - 7.450   pCO2 arterial 36.3 32.0 - 48.0 mmHg   pO2, Arterial 422 (H) 83.0 - 108.0 mmHg   Bicarbonate 25.6 20.0 - 28.0 mmol/L   TCO2 27 22 - 32 mmol/L   O2 Saturation 100.0 %   Acid-Base Excess 1.0 0.0 - 2.0 mmol/L   Sodium 137 135 - 145 mmol/L   Potassium 4.3 3.5 - 5.1 mmol/L    Calcium, Ion 1.14 (L) 1.15 - 1.40 mmol/L   HCT 25.0 (L) 39.0 - 52.0 %   Hemoglobin 8.5 (L) 13.0 - 17.0 g/dL   Patient temperature 34.5 C    Sample type ARTERIAL   POCT Activated clotting time     Status: None   Collection Time: 11/27/2019 10:37 AM  Result Value Ref Range   Activated Clotting Time 191  seconds  POCT Activated clotting time     Status: None   Collection Time: 11/28/2019 10:46 AM  Result Value Ref Range   Activated Clotting Time 241 seconds  I-STAT 7, (LYTES, BLD GAS, ICA, H+H)     Status: Abnormal   Collection Time: 11/13/2019 10:53 AM  Result Value Ref Range   pH, Arterial 7.317 (L) 7.350 - 7.450   pCO2 arterial 43.4 32.0 - 48.0 mmHg   pO2, Arterial 215 (H) 83.0 - 108.0 mmHg   Bicarbonate 22.5 20.0 - 28.0 mmol/L   TCO2 24 22 - 32 mmol/L   O2 Saturation 100.0 %   Acid-base deficit 4.0 (H) 0.0 - 2.0 mmol/L   Sodium 136 135 - 145 mmol/L   Potassium 5.3 (H) 3.5 - 5.1 mmol/L   Calcium, Ion 1.12 (L) 1.15 - 1.40 mmol/L   HCT 24.0 (L) 39.0 - 52.0 %   Hemoglobin 8.2 (L) 13.0 - 17.0 g/dL   Patient temperature 35.7 C    Sample type ARTERIAL   I-STAT 7, (LYTES, BLD GAS, ICA, H+H)     Status: Abnormal   Collection Time: 11/24/2019 11:18 AM  Result Value Ref Range   pH, Arterial 7.291 (L) 7.350 - 7.450   pCO2 arterial 44.2 32.0 - 48.0 mmHg   pO2, Arterial 240 (H) 83.0 - 108.0 mmHg   Bicarbonate 21.6 20.0 - 28.0 mmol/L   TCO2 23 22 - 32 mmol/L   O2 Saturation 100.0 %   Acid-base deficit 5.0 (H) 0.0 - 2.0 mmol/L   Sodium 136 135 - 145 mmol/L   Potassium 6.1 (H) 3.5 - 5.1 mmol/L   Calcium, Ion 1.00 (L) 1.15 - 1.40 mmol/L   HCT 23.0 (L) 39.0 - 52.0 %   Hemoglobin 7.8 (L) 13.0 - 17.0 g/dL   Patient temperature 35.9 C    Collection site Radial    Drawn by Nurse    Sample type ARTERIAL   POCT Activated clotting time     Status: None   Collection Time: 11/29/2019 11:29 AM  Result Value Ref Range   Activated Clotting Time 235 seconds  POCT Activated clotting time     Status: None    Collection Time: 11/20/2019 12:08 PM  Result Value Ref Range   Activated Clotting Time 120 seconds  I-STAT 7, (LYTES, BLD GAS, ICA, H+H)     Status: Abnormal   Collection Time: 11/23/2019 12:34 PM  Result Value Ref Range   pH, Arterial 7.330 (L) 7.350 - 7.450   pCO2 arterial 43.0 32.0 - 48.0 mmHg   pO2, Arterial 258 (H) 83.0 - 108.0 mmHg   Bicarbonate 23.0 20.0 - 28.0 mmol/L   TCO2 24 22 - 32 mmol/L   O2 Saturation 100.0 %   Acid-base deficit 3.0 (H) 0.0 - 2.0 mmol/L   Sodium 135 135 - 145 mmol/L   Potassium 7.3 (HH) 3.5 - 5.1 mmol/L   Calcium, Ion 1.12 (L) 1.15 - 1.40 mmol/L   HCT 28.0 (L) 39.0 - 52.0 %   Hemoglobin 9.5 (L) 13.0 - 17.0 g/dL   Patient temperature 35.6 C    Sample type ARTERIAL   I-STAT, chem 8     Status: Abnormal   Collection Time: 11/24/2019 12:47 PM  Result Value Ref Range   Sodium 136 135 - 145 mmol/L   Potassium 7.4 (HH) 3.5 - 5.1 mmol/L   Chloride 106 98 - 111 mmol/L   BUN 42 (H) 8 - 23 mg/dL   Creatinine, Ser 7.40 (H) 0.61 -  1.24 mg/dL   Glucose, Bld 169 (H) 70 - 99 mg/dL    Comment: Glucose reference range applies only to samples taken after fasting for at least 8 hours.   Calcium, Ion 1.08 (L) 1.15 - 1.40 mmol/L   TCO2 24 22 - 32 mmol/L   Hemoglobin 9.2 (L) 13.0 - 17.0 g/dL   HCT 27.0 (L) 39.0 - 52.0 %  I-STAT, chem 8     Status: Abnormal   Collection Time: 12/05/2019  1:05 PM  Result Value Ref Range   Sodium 135 135 - 145 mmol/L   Potassium 6.8 (HH) 3.5 - 5.1 mmol/L   Chloride 105 98 - 111 mmol/L   BUN 42 (H) 8 - 23 mg/dL   Creatinine, Ser 7.30 (H) 0.61 - 1.24 mg/dL   Glucose, Bld 305 (H) 70 - 99 mg/dL    Comment: Glucose reference range applies only to samples taken after fasting for at least 8 hours.   Calcium, Ion 1.11 (L) 1.15 - 1.40 mmol/L   TCO2 26 22 - 32 mmol/L   Hemoglobin 9.2 (L) 13.0 - 17.0 g/dL   HCT 27.0 (L) 39.0 - 52.0 %  Glucose, capillary     Status: Abnormal   Collection Time: 11/11/2019  1:31 PM  Result Value Ref Range    Glucose-Capillary 196 (H) 70 - 99 mg/dL    Comment: Glucose reference range applies only to samples taken after fasting for at least 8 hours.     ROS: Unable to obtain ROS Physical Exam: Vitals:   11/24/2019 1335 11/15/2019 1350  BP: (!) 159/63 121/61  Pulse: 71 66  Resp: 20 19  Temp:    SpO2: 99% 99%     General exam: Not in distress, quite lethargic under the influence of anesthesia Respiratory system: Clear to auscultation. Respiratory effort normal. No wheezing or crackle Cardiovascular system: S1 & S2 heard, RRR.  No pedal edema. Gastrointestinal system: Surgical scar present, no sign of bleeding. Central nervous system: Quite sedated. Extremities: No edema noted. Skin: No rashes, lesions or ulcers Psychiatry: Under the influence of anesthesia. Dialysis Access: Left upper extremity AV fistula has good thrill and bruit  Assessment/Plan: #AAA: Is status post abdominal aortic aneurysm repair by vascular surgery.  Currently in postop area and will go 2H for further care.  As per vascular surgery.  #Hyperkalemia: Order dextrose, insulin, calcium and bicarbonate while waiting for dialysis.  HD today with low potassium bath.  #ESRD on HD: Plan for urgent HD today in ICU because of hyperkalemia.  Use 1K bath for first hour and then 2K bath.  Need to obtain outpatient dialysis record as patient cannot provide detailed information.  AV fistula for the access.  #Hypertension: BP acceptable, goal UF 1 kg as tolerated.  Avoid hypotensive episode.  #CKD MBD: Check phosphorus level.  #Anemia of CKD: Hemoglobin dropped to 7.8 due to acute blood loss.  Received 3 units of blood transfusion with hemoglobin 9.2.  Monitor hemoglobin.  Discussed with Dr. Carlis Abbott and dialysis nurse.  [facility] 11/22/2019, 1:52 PM  [facility].

## 2019-11-08 NOTE — Discharge Instructions (Signed)
 [facility]  Discharge Instructions   Open Aortic Surgery  Please refer to the following instructions for your post-procedure care. Your surgeon or Physician Assistant will discuss any changes with you.  Activity  Avoid lifting more than eight pounds (a gallon of milk) until after your first post-operative visit. You are encouraged to walk as much as you can. You can slowly return to normal activities but must avoid strenuous activity and heavy lifting until your doctor tells you it's okay. Heavy lifting can hurt the incision and cause a hernia. Avoid activities such as vacuuming or swinging a golf club. It is normal to feel tired for several weeks after your surgery. Do not drive until your doctor gives the okay and you are no longer taking prescription pain medications. It is also normal to have difficulty with sleep habits, eating and bowl movements after surgery. These will go away with time.  Bathing/Showering  Shower daily after you go home. Do not soak in a bathtub, hot tub, or swim until the incision heals.  Incision Care  Shower every day. Clean your incision with mild soap and water. Pat the area dry with a clean towel. You do not need a bandage unless otherwise instructed. Do not apply any ointments or creams to your incision. You may have skin glue on your incision. Do not peel it off. It will come off on its own in about one week. If you have staples or sutures along your incision, they will be removed at your post op appointment.  If you have groin incisions, wash the groin wounds with soap and water daily and pat dry. (No tub bath-only shower)  Then put a dry gauze or washcloth in the groin to keep this area dry to help prevent wound infection.  Do this daily and as needed.  Do not use Vaseline or neosporin on your incisions.  Only use soap and water on your incisions and then protect and keep dry.  Diet  Resume your normal diet. There are no  special food restriction following this procedure. A low fat/low cholesterol diet is recommended for all patients with vascular disease. After your aortic surgery, it's normal to feel full faster than usual and to not feel as hungry as you normally would. You will probably lose weight initially following your surgery. It's best to eat small, frequent meals over the course of the day. Call the office if you find that you are unable to eat even small meals.   In order to heal from your surgery, it is CRITICAL to get adequate nutrition. Your body requires vitamins, minerals, and protein. Vegetables are the best source of vitamins and minerals. If you have pain, you may take over-the-counter pain reliever such as acetaminophen (Tylenol). If you were prescribed a stronger pain medication, please be aware these medication can cause nausea and constipation. Prevent nausea by taking the medication with a snack or meal. Avoid constipation by drinking plenty of fluids and eating foods with a high amount of fiber, such as fruits, vegetables and grains. Take 100mg of the over-the-counter stool softener Colace twice a day as needed to help with constipation. A laxative, such as Milk of Magnesia, may be recommended for you at this time. Do not take a laxative unless your surgeon or P.A. tells you it's OK.  Do not take Tylenol if you are taking stronger pain medications (such as Percocet).  Follow Up  Our office will schedule a follow up   appointment 2-3 weeks after discharge.  Please call us immediately for any of the following conditions    .     Severe or worsening pain in your legs or feet or in your abdomen back or chest. Increased pain, redness drainage (pus) from your incision site. Increased abdominal pain, bloating, nausea, vomiting, or persistent diarrhea. Fever of 101 degrees or higher. Swelling in your leg (s).  Reduce your risk of vascular disease  Stop smoking. If you would like help, call  [facility] at 1-800-QUIT-NOW (1-800-784-8669) or [place] at 336-586-4000. Manage your cholesterol Maintain a desired weight Control your diabetes Keep your blood pressure down  If you have any questions please call the office at [phone].   

## 2019-11-09 ENCOUNTER — Inpatient Hospital Stay (HOSPITAL_COMMUNITY): Payer: PPO

## 2019-11-09 ENCOUNTER — Encounter: Payer: Self-pay | Admitting: *Deleted

## 2019-11-09 DIAGNOSIS — J9601 Acute respiratory failure with hypoxia: Secondary | ICD-10-CM | POA: Diagnosis not present

## 2019-11-09 LAB — POCT I-STAT 7, (LYTES, BLD GAS, ICA,H+H)
Acid-Base Excess: 1 mmol/L (ref 0.0–2.0)
Acid-base deficit: 2 mmol/L (ref 0.0–2.0)
Acid-base deficit: 2 mmol/L (ref 0.0–2.0)
Acid-base deficit: 2 mmol/L (ref 0.0–2.0)
Acid-base deficit: 3 mmol/L — ABNORMAL HIGH (ref 0.0–2.0)
Acid-base deficit: 6 mmol/L — ABNORMAL HIGH (ref 0.0–2.0)
Bicarbonate: 24.6 mmol/L (ref 20.0–28.0)
Bicarbonate: 24 mmol/L (ref 20.0–28.0)
Bicarbonate: 23.4 mmol/L (ref 20.0–28.0)
Bicarbonate: 23.5 mmol/L (ref 20.0–28.0)
Bicarbonate: 21.6 mmol/L (ref 20.0–28.0)
Bicarbonate: 20.4 mmol/L (ref 20.0–28.0)
Calcium, Ion: 1 mmol/L — ABNORMAL LOW (ref 1.15–1.40)
Calcium, Ion: 1 mmol/L — ABNORMAL LOW (ref 1.15–1.40)
Calcium, Ion: 1 mmol/L — ABNORMAL LOW (ref 1.15–1.40)
Calcium, Ion: 0.96 mmol/L — ABNORMAL LOW (ref 1.15–1.40)
Calcium, Ion: 0.91 mmol/L — ABNORMAL LOW (ref 1.15–1.40)
Calcium, Ion: 0.96 mmol/L — ABNORMAL LOW (ref 1.15–1.40)
HCT: 31 % — ABNORMAL LOW (ref 39.0–52.0)
HCT: 32 % — ABNORMAL LOW (ref 39.0–52.0)
HCT: 33 % — ABNORMAL LOW (ref 39.0–52.0)
HCT: 32 % — ABNORMAL LOW (ref 39.0–52.0)
HCT: 29 % — ABNORMAL LOW (ref 39.0–52.0)
HCT: 24 % — ABNORMAL LOW (ref 39.0–52.0)
Hemoglobin: 10.5 g/dL — ABNORMAL LOW (ref 13.0–17.0)
Hemoglobin: 10.9 g/dL — ABNORMAL LOW (ref 13.0–17.0)
Hemoglobin: 11.2 g/dL — ABNORMAL LOW (ref 13.0–17.0)
Hemoglobin: 10.9 g/dL — ABNORMAL LOW (ref 13.0–17.0)
Hemoglobin: 9.9 g/dL — ABNORMAL LOW (ref 13.0–17.0)
Hemoglobin: 8.2 g/dL — ABNORMAL LOW (ref 13.0–17.0)
O2 Saturation: 91 %
O2 Saturation: 73 %
O2 Saturation: 92 %
O2 Saturation: 95 %
O2 Saturation: 96 %
O2 Saturation: 94 %
Patient temperature: 99.3
Patient temperature: 98.2
Patient temperature: 98.6
Patient temperature: 97.7
Patient temperature: 98.6
Patient temperature: 33.6
Potassium: 4.2 mmol/L (ref 3.5–5.1)
Potassium: 4.8 mmol/L (ref 3.5–5.1)
Potassium: 5.1 mmol/L (ref 3.5–5.1)
Potassium: 5.9 mmol/L — ABNORMAL HIGH (ref 3.5–5.1)
Potassium: 7.1 mmol/L (ref 3.5–5.1)
Potassium: 7.2 mmol/L (ref 3.5–5.1)
Sodium: 138 mmol/L (ref 135–145)
Sodium: 138 mmol/L (ref 135–145)
Sodium: 138 mmol/L (ref 135–145)
Sodium: 137 mmol/L (ref 135–145)
Sodium: 134 mmol/L — ABNORMAL LOW (ref 135–145)
Sodium: 135 mmol/L (ref 135–145)
TCO2: 26 mmol/L (ref 22–32)
TCO2: 25 mmol/L (ref 22–32)
TCO2: 25 mmol/L (ref 22–32)
TCO2: 25 mmol/L (ref 22–32)
TCO2: 23 mmol/L (ref 22–32)
TCO2: 22 mmol/L (ref 22–32)
pCO2 arterial: 36.2 mmHg (ref 32.0–48.0)
pCO2 arterial: 44.7 mmHg (ref 32.0–48.0)
pCO2 arterial: 42 mmHg (ref 32.0–48.0)
pCO2 arterial: 40.7 mmHg (ref 32.0–48.0)
pCO2 arterial: 35.6 mmHg (ref 32.0–48.0)
pCO2 arterial: 35.4 mmHg (ref 32.0–48.0)
pH, Arterial: 7.443 (ref 7.350–7.450)
pH, Arterial: 7.336 — ABNORMAL LOW (ref 7.350–7.450)
pH, Arterial: 7.353 (ref 7.350–7.450)
pH, Arterial: 7.368 (ref 7.350–7.450)
pH, Arterial: 7.392 (ref 7.350–7.450)
pH, Arterial: 7.352 (ref 7.350–7.450)
pO2, Arterial: 60 mmHg — ABNORMAL LOW (ref 83.0–108.0)
pO2, Arterial: 41 mmHg — ABNORMAL LOW (ref 83.0–108.0)
pO2, Arterial: 66 mmHg — ABNORMAL LOW (ref 83.0–108.0)
pO2, Arterial: 78 mmHg — ABNORMAL LOW (ref 83.0–108.0)
pO2, Arterial: 83 mmHg (ref 83.0–108.0)
pO2, Arterial: 62 mmHg — ABNORMAL LOW (ref 83.0–108.0)

## 2019-11-09 LAB — CBC
HCT: 34.7 % — ABNORMAL LOW (ref 39.0–52.0)
HCT: 34.5 % — ABNORMAL LOW (ref 39.0–52.0)
HCT: 34.7 % — ABNORMAL LOW (ref 39.0–52.0)
Hemoglobin: 11.8 g/dL — ABNORMAL LOW (ref 13.0–17.0)
Hemoglobin: 11.5 g/dL — ABNORMAL LOW (ref 13.0–17.0)
Hemoglobin: 11.5 g/dL — ABNORMAL LOW (ref 13.0–17.0)
MCH: 27.5 pg (ref 26.0–34.0)
MCH: 28 pg (ref 26.0–34.0)
MCH: 28 pg (ref 26.0–34.0)
MCHC: 34 g/dL (ref 30.0–36.0)
MCHC: 33.3 g/dL (ref 30.0–36.0)
MCHC: 33.1 g/dL (ref 30.0–36.0)
MCV: 80.9 fL (ref 80.0–100.0)
MCV: 83.9 fL (ref 80.0–100.0)
MCV: 84.6 fL (ref 80.0–100.0)
Platelets: 111 10*3/uL — ABNORMAL LOW (ref 150–400)
Platelets: 107 10*3/uL — ABNORMAL LOW (ref 150–400)
Platelets: 103 K/uL — ABNORMAL LOW (ref 150–400)
RBC: 4.29 MIL/uL (ref 4.22–5.81)
RBC: 4.11 MIL/uL — ABNORMAL LOW (ref 4.22–5.81)
RBC: 4.1 MIL/uL — ABNORMAL LOW (ref 4.22–5.81)
RDW: 16.9 % — ABNORMAL HIGH (ref 11.5–15.5)
RDW: 17.4 % — ABNORMAL HIGH (ref 11.5–15.5)
RDW: 17.8 % — ABNORMAL HIGH (ref 11.5–15.5)
WBC: 6.7 10*3/uL (ref 4.0–10.5)
WBC: 11.5 10*3/uL — ABNORMAL HIGH (ref 4.0–10.5)
WBC: 11.8 K/uL — ABNORMAL HIGH (ref 4.0–10.5)
nRBC: 0 % (ref 0.0–0.2)
nRBC: 0 % (ref 0.0–0.2)
nRBC: 0 % (ref 0.0–0.2)

## 2019-11-09 LAB — BASIC METABOLIC PANEL WITH GFR
Anion gap: 10 (ref 5–15)
Anion gap: 15 (ref 5–15)
BUN: 31 mg/dL — ABNORMAL HIGH (ref 8–23)
BUN: 38 mg/dL — ABNORMAL HIGH (ref 8–23)
CO2: 23 mmol/L (ref 22–32)
CO2: 21 mmol/L — ABNORMAL LOW (ref 22–32)
Calcium: 7.5 mg/dL — ABNORMAL LOW (ref 8.9–10.3)
Calcium: 7.3 mg/dL — ABNORMAL LOW (ref 8.9–10.3)
Chloride: 105 mmol/L (ref 98–111)
Chloride: 105 mmol/L (ref 98–111)
Creatinine, Ser: 6.62 mg/dL — ABNORMAL HIGH (ref 0.61–1.24)
Creatinine, Ser: 7.14 mg/dL — ABNORMAL HIGH (ref 0.61–1.24)
GFR calc Af Amer: 9 mL/min — ABNORMAL LOW
GFR calc Af Amer: 8 mL/min — ABNORMAL LOW
GFR calc non Af Amer: 8 mL/min — ABNORMAL LOW
GFR calc non Af Amer: 7 mL/min — ABNORMAL LOW
Glucose, Bld: 110 mg/dL — ABNORMAL HIGH (ref 70–99)
Glucose, Bld: 118 mg/dL — ABNORMAL HIGH (ref 70–99)
Potassium: 4.9 mmol/L (ref 3.5–5.1)
Potassium: 6 mmol/L — ABNORMAL HIGH (ref 3.5–5.1)
Sodium: 138 mmol/L (ref 135–145)
Sodium: 141 mmol/L (ref 135–145)

## 2019-11-09 LAB — RENAL FUNCTION PANEL
Albumin: 2.2 g/dL — ABNORMAL LOW (ref 3.5–5.0)
Anion gap: 14 (ref 5–15)
BUN: 33 mg/dL — ABNORMAL HIGH (ref 8–23)
CO2: 19 mmol/L — ABNORMAL LOW (ref 22–32)
Calcium: 7.2 mg/dL — ABNORMAL LOW (ref 8.9–10.3)
Chloride: 105 mmol/L (ref 98–111)
Creatinine, Ser: 5.47 mg/dL — ABNORMAL HIGH (ref 0.61–1.24)
GFR calc Af Amer: 12 mL/min — ABNORMAL LOW (ref >60)
GFR calc non Af Amer: 10 mL/min — ABNORMAL LOW (ref >60)
Glucose, Bld: 131 mg/dL — ABNORMAL HIGH (ref 70–99)
Phosphorus: 8.4 mg/dL — ABNORMAL HIGH (ref 2.5–4.6)
Potassium: >7.5 mmol/L (ref 3.5–5.1)
Sodium: 138 mmol/L (ref 135–145)

## 2019-11-09 LAB — GLUCOSE, CAPILLARY
Glucose-Capillary: 250 mg/dL — ABNORMAL HIGH (ref 70–99)
Glucose-Capillary: 33 mg/dL — CL (ref 70–99)
Glucose-Capillary: 47 mg/dL — ABNORMAL LOW (ref 70–99)
Glucose-Capillary: 74 mg/dL (ref 70–99)
Glucose-Capillary: 83 mg/dL (ref 70–99)
Glucose-Capillary: 84 mg/dL (ref 70–99)
Glucose-Capillary: 85 mg/dL (ref 70–99)
Glucose-Capillary: 87 mg/dL (ref 70–99)
Glucose-Capillary: 96 mg/dL (ref 70–99)

## 2019-11-09 LAB — BPAM FFP
Blood Product Expiration Date: 202105082359
Blood Product Expiration Date: 202105082359
ISSUE DATE / TIME: 202105030808
ISSUE DATE / TIME: 202105030808
Unit Type and Rh: 8400
Unit Type and Rh: 8400

## 2019-11-09 LAB — COMPREHENSIVE METABOLIC PANEL
ALT: 259 U/L — ABNORMAL HIGH (ref 0–44)
AST: 303 U/L — ABNORMAL HIGH (ref 15–41)
Albumin: 2.8 g/dL — ABNORMAL LOW (ref 3.5–5.0)
Alkaline Phosphatase: 38 U/L (ref 38–126)
Anion gap: 12 (ref 5–15)
BUN: 24 mg/dL — ABNORMAL HIGH (ref 8–23)
CO2: 23 mmol/L (ref 22–32)
Calcium: 7.8 mg/dL — ABNORMAL LOW (ref 8.9–10.3)
Chloride: 104 mmol/L (ref 98–111)
Creatinine, Ser: 5.14 mg/dL — ABNORMAL HIGH (ref 0.61–1.24)
GFR calc Af Amer: 12 mL/min — ABNORMAL LOW (ref >60)
GFR calc non Af Amer: 11 mL/min — ABNORMAL LOW (ref >60)
Glucose, Bld: 77 mg/dL (ref 70–99)
Potassium: 3.5 mmol/L (ref 3.5–5.1)
Sodium: 139 mmol/L (ref 135–145)
Total Bilirubin: 1.7 mg/dL — ABNORMAL HIGH (ref 0.3–1.2)
Total Protein: 4.7 g/dL — ABNORMAL LOW (ref 6.5–8.1)

## 2019-11-09 LAB — PREPARE FRESH FROZEN PLASMA

## 2019-11-09 LAB — LACTIC ACID, PLASMA
Lactic Acid, Venous: 3.5 mmol/L (ref 0.5–1.9)
Lactic Acid, Venous: 5.6 mmol/L (ref 0.5–1.9)
Lactic Acid, Venous: 8.2 mmol/L (ref 0.5–1.9)

## 2019-11-09 LAB — HEPATITIS B SURFACE ANTIGEN: Hepatitis B Surface Ag: NONREACTIVE

## 2019-11-09 LAB — TROPONIN I (HIGH SENSITIVITY)
Troponin I (High Sensitivity): 23 ng/L — ABNORMAL HIGH
Troponin I (High Sensitivity): 93 ng/L — ABNORMAL HIGH (ref <18)

## 2019-11-09 LAB — PHOSPHORUS: Phosphorus: 2.9 mg/dL (ref 2.5–4.6)

## 2019-11-09 LAB — AMYLASE: Amylase: 258 U/L — ABNORMAL HIGH (ref 28–100)

## 2019-11-09 LAB — BRAIN NATRIURETIC PEPTIDE: B Natriuretic Peptide: 271.6 pg/mL — ABNORMAL HIGH (ref 0.0–100.0)

## 2019-11-09 LAB — MAGNESIUM: Magnesium: 1.5 mg/dL — ABNORMAL LOW (ref 1.7–2.4)

## 2019-11-09 MED ORDER — DEXMEDETOMIDINE HCL IN NACL 400 MCG/100ML IV SOLN
0.0000 ug/kg/h | INTRAVENOUS | Status: DC
  Administered 2019-11-09 (×2): 1.2 ug/kg/h via INTRAVENOUS
  Filled 2019-11-09 (×3): qty 100

## 2019-11-09 MED ORDER — PHENYLEPHRINE CONCENTRATED 100MG/250ML (0.4 MG/ML) INFUSION SIMPLE
0.0000 ug/min | INTRAVENOUS | Status: DC
  Filled 2019-11-09: qty 250

## 2019-11-09 MED ORDER — SODIUM CHLORIDE 0.9 % IV SOLN
2.0000 g | INTRAVENOUS | Status: DC
  Administered 2019-11-09 – 2019-11-15 (×7): 2 g via INTRAVENOUS
  Filled 2019-11-09 (×3): qty 2
  Filled 2019-11-09: qty 20
  Filled 2019-11-09 (×3): qty 2
  Filled 2019-11-09: qty 20
  Filled 2019-11-09: qty 2

## 2019-11-09 MED ORDER — PHENYLEPHRINE HCL-NACL 10-0.9 MG/250ML-% IV SOLN
0.0000 ug/min | INTRAVENOUS | Status: DC
  Administered 2019-11-09: 200 ug/min via INTRAVENOUS
  Administered 2019-11-09: 10 ug/min via INTRAVENOUS
  Filled 2019-11-09 (×2): qty 250

## 2019-11-09 MED ORDER — DEXMEDETOMIDINE HCL IN NACL 400 MCG/100ML IV SOLN: 0.4000 ug/kg/h | INTRAVENOUS | Status: DC

## 2019-11-09 MED ORDER — INSULIN ASPART 100 UNIT/ML ~~LOC~~ SOLN
10.0000 [IU] | Freq: Once | SUBCUTANEOUS | Status: AC
  Administered 2019-11-09: 10 [IU] via INTRAVENOUS

## 2019-11-09 MED ORDER — CALCIUM GLUCONATE-NACL 1-0.675 GM/50ML-% IV SOLN: 1.0000 g | Freq: Once | INTRAVENOUS | Status: DC

## 2019-11-09 MED ORDER — SODIUM BICARBONATE 8.4 % IV SOLN
INTRAVENOUS | Status: AC
  Filled 2019-11-09: qty 50

## 2019-11-09 MED ORDER — CHLORHEXIDINE GLUCONATE 0.12 % MT SOLN
15.0000 mL | Freq: Two times a day (BID) | OROMUCOSAL | Status: DC
  Administered 2019-11-09 (×3): 15 mL via OROMUCOSAL
  Filled 2019-11-09: qty 15

## 2019-11-09 MED ORDER — PRISMASOL BGK 4/2.5 32-4-2.5 MEQ/L REPLACEMENT SOLN: Status: DC

## 2019-11-09 MED ORDER — FENTANYL CITRATE (PF) 100 MCG/2ML IJ SOLN
25.0000 ug | INTRAMUSCULAR | Status: DC | PRN
  Filled 2019-11-09: qty 2

## 2019-11-09 MED ORDER — MIDAZOLAM HCL 2 MG/2ML IJ SOLN
INTRAMUSCULAR | Status: AC
  Filled 2019-11-09: qty 2

## 2019-11-09 MED ORDER — NOREPINEPHRINE 16 MG/250ML-% IV SOLN
0.0000 ug/min | INTRAVENOUS | Status: DC
  Administered 2019-11-09: 5 ug/min via INTRAVENOUS
  Administered 2019-11-10: 30 ug/min via INTRAVENOUS
  Administered 2019-11-11: 6 ug/min via INTRAVENOUS
  Administered 2019-11-13: 2 ug/min via INTRAVENOUS
  Filled 2019-11-09 (×5): qty 250

## 2019-11-09 MED ORDER — PRISMASOL BGK 4/2.5 32-4-2.5 MEQ/L IV SOLN: INTRAVENOUS | Status: DC

## 2019-11-09 MED ORDER — POLYETHYLENE GLYCOL 3350 17 G PO PACK: 17.0000 g | PACK | Freq: Every day | ORAL | Status: DC

## 2019-11-09 MED ORDER — DOCUSATE SODIUM 50 MG/5ML PO LIQD: 100.0000 mg | Freq: Two times a day (BID) | ORAL | Status: DC

## 2019-11-09 MED ORDER — DEXTROSE 50 % IV SOLN
INTRAVENOUS | Status: AC
  Administered 2019-11-09: 50 mL
  Filled 2019-11-09: qty 50

## 2019-11-09 MED ORDER — FENTANYL CITRATE (PF) 100 MCG/2ML IJ SOLN: 25.0000 ug | Freq: Once | INTRAMUSCULAR | Status: AC

## 2019-11-09 MED ORDER — DEXTROSE 50 % IV SOLN
INTRAVENOUS | Status: AC
  Filled 2019-11-09: qty 50

## 2019-11-09 MED ORDER — FENTANYL CITRATE (PF) 100 MCG/2ML IJ SOLN
INTRAMUSCULAR | Status: AC
  Filled 2019-11-09: qty 2

## 2019-11-09 MED ORDER — HEPARIN (PORCINE) 2000 UNITS/L FOR CRRT
INTRAVENOUS_CENTRAL | Status: DC | PRN
  Filled 2019-11-09: qty 1000

## 2019-11-09 MED ORDER — IOHEXOL 350 MG/ML SOLN
100.0000 mL | Freq: Once | INTRAVENOUS | Status: AC | PRN
  Administered 2019-11-09: 100 mL via INTRAVENOUS

## 2019-11-09 MED ORDER — DEXTROSE 50 % IV SOLN
1.0000 | Freq: Once | INTRAVENOUS | Status: AC
  Administered 2019-11-09: 50 mL via INTRAVENOUS

## 2019-11-09 MED ORDER — METRONIDAZOLE IN NACL 5-0.79 MG/ML-% IV SOLN
500.0000 mg | Freq: Three times a day (TID) | INTRAVENOUS | Status: DC
  Administered 2019-11-09 – 2019-11-16 (×20): 500 mg via INTRAVENOUS
  Filled 2019-11-09 (×18): qty 100

## 2019-11-09 MED ORDER — VASOPRESSIN 20 UNIT/ML IV SOLN
0.0100 [IU]/min | INTRAVENOUS | Status: DC
  Administered 2019-11-09 – 2019-11-11 (×2): 0.01 [IU]/min via INTRAVENOUS
  Filled 2019-11-09 (×2): qty 2

## 2019-11-09 MED ORDER — FENTANYL BOLUS VIA INFUSION
25.0000 ug | INTRAVENOUS | Status: DC | PRN
  Administered 2019-11-13 – 2019-11-14 (×3): 25 ug via INTRAVENOUS
  Filled 2019-11-09: qty 25

## 2019-11-09 MED ORDER — SODIUM CHLORIDE 0.9% FLUSH
10.0000 mL | Freq: Two times a day (BID) | INTRAVENOUS | Status: DC
  Administered 2019-11-09: 40 mL
  Administered 2019-11-09: 20 mL
  Administered 2019-11-10: 10 mL
  Administered 2019-11-11: 40 mL
  Administered 2019-11-13 – 2019-11-14 (×2): 10 mL
  Administered 2019-11-15: 40 mL
  Administered 2019-11-15: 10 mL

## 2019-11-09 MED ORDER — CALCIUM CHLORIDE 10 % IV SOLN
INTRAVENOUS | Status: AC
  Administered 2019-11-09: 1000 mg
  Filled 2019-11-09: qty 10

## 2019-11-09 MED ORDER — ALBUMIN HUMAN 5 % IV SOLN
12.5000 g | Freq: Once | INTRAVENOUS | Status: AC
  Administered 2019-11-09: 12.5 g via INTRAVENOUS
  Filled 2019-11-09: qty 250

## 2019-11-09 MED ORDER — SODIUM BICARBONATE 8.4 % IV SOLN
50.0000 meq | Freq: Once | INTRAVENOUS | Status: AC
  Administered 2019-11-09: 50 meq via INTRAVENOUS

## 2019-11-09 MED ORDER — SODIUM CHLORIDE 0.9% FLUSH: 10.0000 mL | INTRAVENOUS | Status: DC | PRN

## 2019-11-09 MED ORDER — HEPARIN SODIUM (PORCINE) 1000 UNIT/ML DIALYSIS
1000.0000 [IU] | INTRAMUSCULAR | Status: DC | PRN
  Administered 2019-11-10: 3000 [IU] via INTRAVENOUS_CENTRAL
  Administered 2019-11-14: 2800 [IU] via INTRAVENOUS_CENTRAL
  Filled 2019-11-09: qty 4
  Filled 2019-11-09 (×3): qty 6

## 2019-11-09 MED ORDER — ORAL CARE MOUTH RINSE
15.0000 mL | Freq: Two times a day (BID) | OROMUCOSAL | Status: DC
  Administered 2019-11-09 (×2): 15 mL via OROMUCOSAL

## 2019-11-09 MED ORDER — SODIUM CHLORIDE 0.9 % IV SOLN: 1.0000 g | Freq: Once | INTRAVENOUS | Status: DC

## 2019-11-09 MED ORDER — SODIUM CHLORIDE 0.9 % IV BOLUS
1000.0000 mL | Freq: Once | INTRAVENOUS | Status: AC
  Administered 2019-11-09: 1000 mL via INTRAVENOUS

## 2019-11-09 MED ORDER — FENTANYL CITRATE (PF) 100 MCG/2ML IJ SOLN
25.0000 ug | INTRAMUSCULAR | Status: DC | PRN
  Administered 2019-11-09 (×2): 100 ug via INTRAVENOUS
  Administered 2019-11-09: 50 ug via INTRAVENOUS
  Filled 2019-11-09: qty 2

## 2019-11-09 MED ORDER — FENTANYL 2500MCG IN NS 250ML (10MCG/ML) PREMIX INFUSION
25.0000 ug/h | INTRAVENOUS | Status: DC
  Administered 2019-11-09: 100 ug/h via INTRAVENOUS
  Administered 2019-11-10: 150 ug/h via INTRAVENOUS
  Administered 2019-11-11: 100 ug/h via INTRAVENOUS
  Administered 2019-11-12 – 2019-11-16 (×8): 200 ug/h via INTRAVENOUS
  Filled 2019-11-09 (×10): qty 250

## 2019-11-09 NOTE — Procedures (Signed)
Intubation Procedure Note JAMERION CABELLO  [id number]  04/02/1951    Procedure: Intubation Indications: Airway protection and maintenance   Procedure Details Consent: Unable to obtain consent because of emergent medical necessity. Time Out: Not performed due to emergent nature of procedure   Maximum clean technique was used including gloves, hand hygiene and mask  Laryngoscopy equipment used: MAC 4 Glidescope   Medications: Fentanyl 0 mcg, Versed 0 mg, Etomidate 0 mg, Rocuronium 0 mg   Grade 3 airway view   Evaluation Hemodynamic Status: BP stable throughout, stable throughout Patient's Current Condition: unstable Patient did tolerate procedure well Chest X-ray ordered to verify placement.  pending  Georgann Housekeeper, AGACNP-BC [place] Pager [phone] or [phone]  11/09/2019 1:56 PM

## 2019-11-09 NOTE — Progress Notes (Signed)
Pt extubated yesterday and ABG order placed in event of pt being on vent overnight.  RT will not draw gas this AM based on order directions.  RT will continue to monitor.

## 2019-11-09 NOTE — Progress Notes (Signed)
RT note-Placed on HFNC salter at 12L.min post ABG, sp02 now 95%

## 2019-11-09 NOTE — Progress Notes (Signed)
Critical care attending progress note.  Following brief arrest patient brought to CT which showed no PE or dissection. No leak from repair site. Dilated distal colon.  Discussed with Dr Loletta Specter - we remain unsure of the cause for deterioration.  Will cover with empiric antibiotics.  Flotrac indicates adequate SV and CI. Low SVR consistent with distributive shock.  Vasopressin added.  K: 7.1 - bicarb, D50W and insulin given and CRRT started.   CRITICAL CARE Performed by: Kipp Brood   Total critical care time: 60 minutes additional time spent.  Critical care time was exclusive of separately billable procedures and treating other patients.  Critical care was necessary to treat or prevent imminent or life-threatening deterioration.  Critical care was time spent personally by me on the following activities: development of treatment plan with patient and/or surrogate as well as nursing, discussions with consultants, evaluation of patient's response to treatment, examination of patient, obtaining history from patient or surrogate, ordering and performing treatments and interventions, ordering and review of laboratory studies, ordering and review of radiographic studies, pulse oximetry, re-evaluation of patient's condition and participation in multidisciplinary rounds.  Kipp Brood, MD [facility] ICU Physician [place]  Pager: [phone] Mobile: [phone] After hours: [phone].

## 2019-11-09 NOTE — Progress Notes (Signed)
[facility] Progress Note Patient Name: Juan Bass DOB: [birth year] MRN: [id number]   Date of Service  11/09/2019  HPI/Events of Note  Decreased pulses to bilateral LE. Some mottling of bilateral thighs noted. CVP = 5. LVEF = 60-65%. Last K+ = 7.1. K+ already being addressed by medications and CRRT.   eICU Interventions  Will order: 1. Bolus with 0.9 NaCl 1 liter IV over 1 hour now.  2. Wean Norepinephrine IV infusion as tolerated.      Intervention Category Major Interventions: Hypovolemia - evaluation and treatment with fluids  Karthikeya Funke [place] 11/09/2019, 8:39 PM

## 2019-11-09 NOTE — Progress Notes (Signed)
Notified by PCCM of brief cardiac arrest.  Given ongoing hypotension and now with VDRF will plan to initiate CRRT with volume removal as tolerated.

## 2019-11-09 NOTE — Progress Notes (Signed)
[name], male   DOB: 1950/09/19, 69 y.o.   MRN: [id number] S: Developed some shortness of breath this am with increased work of breathing, currently resting comfortably and feels a little better.  He had urgent HD yesterday due to hyperkalemia.  Only able to UF 1 liter due to hypotension.  O:BP (!) 144/62   Pulse 81   Temp 98.6 F (37 C) (Oral)   Resp (!) 26   Ht 5\' 9"  (1.753 m)   Wt 75 kg   SpO2 92%   BMI 24.42 kg/m   Intake/Output Summary (Last 24 hours) at 11/09/2019 1013 Last data filed at 11/09/2019 0600 Gross per 24 hour  Intake 5464.88 ml  Output 1108 ml  Net 4356.88 ml   Intake/Output: I/O last 3 completed shifts: In: 5564.9 [I.V.:2972.7; Blood:1722; NG/GT:10; IV Piggyback:860.2] Out: 1288 [Urine:380; Emesis/NG output:50; Other:58; Blood:800]  Intake/Output this shift:  No intake/output data recorded. Weight change: 0 kg Gen: lying in bed wearing oxygen CVS: RRR, no rub Resp: cta Abd: +BS, mildly tender, surgical scar present Ext: no edema, LUE AVF +T/B  Recent Labs  Lab 11/04/19 1000 11/04/19 1000 11/18/2019 0705 11/07/2019 0857 11/11/2019 1118 12/02/2019 1234 11/20/2019 1247 11/27/2019 1305 11/24/2019 1334 11/09/19 0145 11/09/19 0937  NA 140   < > 139   < > 136 135 136 135 137 139 138  K 4.5   < > 4.0   < > 6.1* 7.3* 7.4* 6.8* 5.8* 3.5 4.8  CL 99  --  102  --   --   --  106 105 107 104  --   CO2 29  --   --   --   --   --   --   --  21* 23  --   GLUCOSE 216*  --  90  --   --   --  169* 305* 202* 77  --   BUN 41*  --  38*  --   --   --  42* 42* 43* 24*  --   CREATININE 8.17*  --  8.20*  --   --   --  7.40* 7.30* 7.55* 5.14*  --   ALBUMIN 3.9  --   --   --   --   --   --   --   --  2.8*  --   CALCIUM 9.3  --   --   --   --   --   --   --  8.1* 7.8*  --   PHOS  --   --   --   --   --   --   --   --  6.0* 2.9  --   AST 24  --   --   --   --   --   --   --   --  303*  --   ALT 29  --   --   --   --   --   --   --   --  259*  --    < > = values in this  interval not displayed.   Liver Function Tests: Recent Labs  Lab 11/04/19 1000 11/09/19 0145  AST 24 303*  ALT 29 259*  ALKPHOS 62 38  BILITOT 0.9 1.7*  PROT 6.6 4.7*  ALBUMIN 3.9 2.8*   Recent Labs  Lab 11/09/19 0145  AMYLASE 258*   No results for input(s): AMMONIA in the last 168  hours. CBC: Recent Labs  Lab 11/04/19 1000 12/02/2019 0705 12/03/2019 1334 11/09/2019 1334 11/11/2019 1941 11/09/19 0145 11/09/19 0937  WBC 8.6  --  7.7  --  10.5 6.7  --   HGB 10.0*   < > 10.8*   < > 12.3* 11.8* 10.9*  HCT 32.0*   < > 32.9*   < > 36.4* 34.7* 32.0*  MCV 82.7  --  84.4  --  82.5 80.9  --   PLT 202  --  121*  --  123* 111*  --    < > = values in this interval not displayed.   Cardiac Enzymes: No results for input(s): CKTOTAL, CKMB, CKMBINDEX, TROPONINI in the last 168 hours. CBG: Recent Labs  Lab 11/22/2019 1609 11/09/19 0136 11/09/19 0329 11/09/19 0644 11/09/19 0838  GLUCAP 193* 87 74 84 96    Iron Studies: No results for input(s): IRON, TIBC, TRANSFERRIN, FERRITIN in the last 72 hours. Studies/Results: DG Abd 1 View  Result Date: 11/09/2019 CLINICAL DATA:  Abdominal distension EXAM: ABDOMEN - 1 VIEW COMPARISON:  09/08/2019 FINDINGS: NG tube extends into the stomach. There is gas and stool in the rectosigmoid colon which is somewhat featureless. No small bowel obstruction identified. IMPRESSION: NG tube in stomach.  Prominent gas and stool-filled sigmoid colon. Electronically Signed   By: Suzy Bouchard M.D.   On: 11/09/2019 08:54   DG Chest Port 1 View  Result Date: 11/09/2019 CLINICAL DATA:  Status post aortic aneurysm repair. EXAM: PORTABLE CHEST 1 VIEW COMPARISON:  Nov 08, 2019. FINDINGS: Stable cardiomediastinal silhouette. Nasogastric tube is unchanged in position. Right internal jugular sheath is stable. Hypoinflation of the lungs is noted with minimal bibasilar subsegmental atelectasis. No pneumothorax or pleural effusion is noted. Bony thorax is unremarkable.  IMPRESSION: Hypoinflation of the lungs with minimal bibasilar subsegmental atelectasis. Electronically Signed   By: Marijo Conception M.D.   On: 11/09/2019 08:54   DG CHEST PORT 1 VIEW  Result Date: 11/09/2019 CLINICAL DATA:  Abnormal respiration. EXAM: PORTABLE CHEST 1 VIEW COMPARISON:  11/25/2019. FINDINGS: NG tube and right IJ sheath in stable position. Stable cardiomegaly. No pulmonary venous congestion. Low lung volumes with bibasilar atelectasis again noted. Tiny left pleural effusion cannot be excluded. No pneumothorax. Degenerative change thoracic spine. IMPRESSION: 1.  NG tube right IJ sheath in stable position. 2.  Stable cardiomegaly. 3. Low lung volumes with bibasilar atelectasis again noted. Tiny left pleural effusion cannot be excluded. Electronically Signed   By: Marcello Moores  Register   On: 11/09/2019 08:44   DG Chest Port 1 View  Result Date: 12/05/2019 CLINICAL DATA:  Postop repair of abdominal aortic aneurysm. EXAM: PORTABLE CHEST 1 VIEW COMPARISON:  Radiograph 12/21/2004 FINDINGS: 1354 hours. Right IJ sheath projects to the level of the upper SVC. Enteric tube projects to the level of the mid stomach. There are low lung volumes with bibasilar atelectasis. No confluent airspace opacity, pneumothorax or significant pleural effusion. The heart size and mediastinal contours are stable for the degree of inspiration. No acute osseous findings are seen. Surgical clips are seen in the upper abdomen. IMPRESSION: Low lung volumes with bibasilar atelectasis. No pneumothorax or other acute process identified. Electronically Signed   By: Richardean Sale M.D.   On: 11/20/2019 14:06   DG Abd Portable 1V  Result Date: 11/24/2019 CLINICAL DATA:  Status post AAA repair EXAM: PORTABLE ABDOMEN - 1 VIEW COMPARISON:  None. FINDINGS: There is no bowel dilatation to suggest obstruction. There is no evidence of pneumoperitoneum,  portal venous gas or pneumatosis. There are no pathologic calcifications along the expected  course of the ureters. There is a right common iliac and external iliac stent graft noted. The osseous structures are unremarkable. IMPRESSION: No acute abdominal abnormality. Electronically Signed   By: Kathreen Devoid   On: 11/29/2019 14:06   . chlorhexidine  15 mL Mouth Rinse BID  . Chlorhexidine Gluconate Cloth  6 each Topical Q0600  . dextrose      . fentaNYL      . [START ON 11/06/2019] heparin  5,000 Units Subcutaneous Q8H  . insulin aspart  0-15 Units Subcutaneous TID WC  . mouth rinse  15 mL Mouth Rinse BID  . mouth rinse  15 mL Mouth Rinse q12n4p  . metoprolol tartrate  2.5 mg Intravenous Q6H  . midazolam      . pantoprazole (PROTONIX) IV  40 mg Intravenous QHS  . sodium chloride flush  10-40 mL Intracatheter Q12H    BMET    Component Value Date/Time   NA 138 11/09/2019 0937   K 4.8 11/09/2019 0937   CL 104 11/09/2019 0145   CO2 23 11/09/2019 0145   GLUCOSE 77 11/09/2019 0145   BUN 24 (H) 11/09/2019 0145   CREATININE 5.14 (H) 11/09/2019 0145   CALCIUM 7.8 (L) 11/09/2019 0145   GFRNONAA 11 (L) 11/09/2019 0145   GFRAA 12 (L) 11/09/2019 0145   CBC    Component Value Date/Time   WBC 6.7 11/09/2019 0145   RBC 4.29 11/09/2019 0145   HGB 10.9 (L) 11/09/2019 0937   HCT 32.0 (L) 11/09/2019 0937   PLT 111 (L) 11/09/2019 0145   MCV 80.9 11/09/2019 0145   MCH 27.5 11/09/2019 0145   MCHC 34.0 11/09/2019 0145   RDW 16.9 (H) 11/09/2019 0145   LYMPHSABS 0.7 03/18/2019 0849   MONOABS 0.3 03/18/2019 0849   EOSABS 0.0 03/18/2019 0849   BASOSABS 0.0 03/18/2019 0849     Assessment/Plan:  1. AAA- s/p repair 11/30/2019  2. ESRD- plan for HD tomorrow. 3. Hyperkalemia- improved with HD  4. SOB- does not appear volume overloaded on exam or CXR.  Discussed case with PCCM.  Will continue to follow for now.  No indication for HD or CRRT at this time.  BP too soft for IHD and UF.  PCCM following now.  5. Anemia- s/p 3 units of PRBC's yesterday.  Follow H/H 6. HTN- low bp today.  Continue  to follow. 7. CKD MBD- low phos, hold binders  Donetta Potts, MD [employer] [phone]

## 2019-11-09 NOTE — Progress Notes (Signed)
Chaplain engaged in initial visit with Juan Bass, his sister Juan Bass, and his grandson [name].  Juan Bass described [name] as a "good man."  He is one of ten boys and three girls.  She also expressed that he is her television partner and that they often watch "Wheel of Fortune" together. Chaplain listened and provided presence as she learned about [name].  Chaplain offered support and prayer over [name] and his family.    Chaplain will continue to follow-up.

## 2019-11-09 NOTE — Progress Notes (Signed)
Hypotension:  Discussed hypotensive episodes with Dr. Oneida Alar during HD run.  Currently, gave patient 500 ml bolus and SBP's are now in the 160's.  Goal per VORB from Dr. Oneida Alar is SBP between 170 - 100.  Will attempt to pull with HD again when BP's are stable.

## 2019-11-09 NOTE — Progress Notes (Signed)
Paged MD for loss of Lower Extremity pulses. Dr.Dickson to come to bedside

## 2019-11-09 NOTE — Progress Notes (Signed)
Consulted critical care to help with management.  Very soft Bp overnight, Hgb stable.  Got IV pain medicine this am and Bp dropped significantly and now on neo drip.  This complicated by increased WOB and CXR looks wet in setting of ESRD.  Marty Heck, MD [facility] [place] Office: [place]

## 2019-11-09 NOTE — Progress Notes (Signed)
Pt transported to CT and back to ICU room with full O2 tanks. No complications noted.

## 2019-11-09 NOTE — Procedures (Signed)
Hemodialysis Catheter Insertion Procedure Note Juan Bass [id number] October 17, 1950  Procedure: Insertion of Hemodialysis Catheter Indications: Dialysis Access   Procedure Details Consent: Risks of procedure as well as the alternatives and risks of each were explained to the (patient/caregiver).  Consent for procedure obtained. Time Out: Verified patient identification, verified procedure, site/side was marked, verified correct patient position, special equipment/implants available, medications/allergies/relevent history reviewed, required imaging and test results available.  Performed  Maximum sterile technique was used including antiseptics, cap, gloves, gown, hand hygiene, mask and sheet. Skin prep: Chlorhexidine; local anesthetic administered Triple lumen hemodialysis catheter was inserted into left subclavian vein using the Seldinger technique.  Unable to cannulate L IJ, guidewire coiled on to itself. Withdrawn and pressure held.   L subclavian easily cannulated but difficult to advance catheter despite several dilatations.   Good blood flow from all ports.  Evaluation Blood flow good Complications: No apparent complications Patient did tolerate procedure well. Chest X-ray ordered to verify placement.  CXR: normal. No PTX and tip projects at brachiocephalic SVC junction.    Juan Bass 11/09/2019

## 2019-11-09 NOTE — Progress Notes (Addendum)
Progress Note    11/09/2019 7:26 AM 1 Day Post-Op  Subjective:  Stable overnight. He looks uncomfortable and is needing something for pain>> Held due to hypotension during HD last night   Vitals:   11/09/19 0530 11/09/19 0630  BP: (!) 97/53 (!) 93/51  Pulse: 85 83  Resp: (!) 31 (!) 34  Temp:    SpO2: 92% 91%    Physical Exam: General: alert and oriented; mild increased WOB Cardiac:  RRR Lungs:  Tachypnic. No crackles or wheezes Incisions:  Midline abd and bil groin incisions well-approximated without bleeding, drainage or hematoma Extremities:  Feet are a little cool to touch. +AROM and 5/5 dorsiflexion. Doppler signals of left PT and peroneal.  Right DP,PT and peroneal Good bruit in LUE AVF. Abdomen:  Mild distension, quiet, minimal NGT output    CBC    Component Value Date/Time   WBC 6.7 11/09/2019 0145   RBC 4.29 11/09/2019 0145   HGB 11.8 (L) 11/09/2019 0145   HCT 34.7 (L) 11/09/2019 0145   PLT 111 (L) 11/09/2019 0145   MCV 80.9 11/09/2019 0145   MCH 27.5 11/09/2019 0145   MCHC 34.0 11/09/2019 0145   RDW 16.9 (H) 11/09/2019 0145   LYMPHSABS 0.7 03/18/2019 0849   MONOABS 0.3 03/18/2019 0849   EOSABS 0.0 03/18/2019 0849   BASOSABS 0.0 03/18/2019 0849    BMET    Component Value Date/Time   NA 139 11/09/2019 0145   K 3.5 11/09/2019 0145   CL 104 11/09/2019 0145   CO2 23 11/09/2019 0145   GLUCOSE 77 11/09/2019 0145   BUN 24 (H) 11/09/2019 0145   CREATININE 5.14 (H) 11/09/2019 0145   CALCIUM 7.8 (L) 11/09/2019 0145   GFRNONAA 11 (L) 11/09/2019 0145   GFRAA 12 (L) 11/09/2019 0145     Intake/Output Summary (Last 24 hours) at 11/09/2019 0726 Last data filed at 11/09/2019 0600 Gross per 24 hour  Intake 5564.88 ml  Output 1288 ml  Net 4276.88 ml    HOSPITAL MEDICATIONS Scheduled Meds: . chlorhexidine  15 mL Mouth Rinse BID  . Chlorhexidine Gluconate Cloth  6 each Topical Q0600  . dextrose      . [START ON 12/03/2019] heparin  5,000 Units Subcutaneous  Q8H  . insulin aspart  0-15 Units Subcutaneous TID WC  . mouth rinse  15 mL Mouth Rinse BID  . mouth rinse  15 mL Mouth Rinse q12n4p  . metoprolol tartrate  2.5 mg Intravenous Q6H  . pantoprazole (PROTONIX) IV  40 mg Intravenous QHS  . sodium chloride flush  10-40 mL Intracatheter Q12H   Continuous Infusions: . sodium chloride    . sodium chloride 75 mL/hr at 11/09/19 0600  . sodium chloride    . sodium chloride    .  ceFAZolin (ANCEF) IV    . labetalol (NORMODYNE) infusion Stopped (11/23/2019 1851)   PRN Meds:.sodium chloride, sodium chloride, sodium chloride, acetaminophen **OR** acetaminophen, alteplase, bisacodyl, guaiFENesin-dextromethorphan, heparin, HYDROmorphone (DILAUDID) injection, lidocaine (PF), lidocaine-prilocaine, ondansetron, pentafluoroprop-tetrafluoroeth, phenol, potassium chloride, sodium chloride flush  Assessment:  69 y.o. male is s/p: aortobifemoral bypass (hx AAA). BP running a low side this am. Hemoglobin stable. Good pedal Doppler signals  Increased WOB but maintaining SaO2 on 2L [place].  Likely due to pain. Discussed pain med administration with attendant RN.  ESRD>>HD yesterday via LUE AVF  1 Day Post-Op  Plan: -Watch BP while providing better pain control -DVT prophylaxis:  SCDs and hep [place]   Risa Grill, PA-C [facility] [phone] 11/09/2019  7:26 AM   I have seen and evaluated the patient. I agree with the PA note as documented above. POD#1 s/p open juxtarenal AAA repair.  Initially very hypertensive postop and was on labetalol drip.  Got dialysis last night for hyperkalemia.  Unable to pull volume off volume but K normal and had volatile pressures overnight.  Now pressures on the soft side.  This will be difficult given his ESRD and will be balancing act from fluid balance standpoint.      N: GCS 15, moving all extremities CV:  Hgb stable 11.8, pressures soft, may need some neo today Pulm: Oxygen saturations 88%, will get CXR,  check blood gas, likely volume overload GI: NPO, NG to ILWS FEN: NS 75, lytes ok, got dialysis last night Renal: ESRD, appreciate nephrology input ID: no active infectious concerns  Left peroneal/PT signal, right DP/PT signal, feet warm, motor and sensory intact    Marty Heck, MD [facility] [place] Office: [phone]

## 2019-11-09 NOTE — Consult Note (Signed)
NAME:  Juan Bass, MRN:  [id number], DOB:  1951/04/22, LOS: 1 ADMISSION DATE:  12/01/2019, CONSULTATION DATE: 11/09/2019 REFERRING MD: Vascular surgery, CHIEF COMPLAINT: Hypoxia hypotension  Brief History   70 year old end-stage renal disease status post AAA repair 11/29/2019 with increasing work of breathing and hypotension following surgery.  History of present illness   69 year old male with known history of end-stage renal disease with a right bicep graft along with hypertension hypothyroidism COPD with chronic long-term tobacco abuse.  Known 6 cm aortic abdominal aneurysm for which he underwent a AAA repair 11/13/2019 along with aortobifem for his vascular disease.  He was extubating in the PACU on 11/22/2019 was not initially hypertensive and required a labetalol drip underwent hemodialysis became hypotensive.  Initially placed on Neo-Synephrine drip did not elevated to a Levophed drip.  Also noted to have some hypoxia with increasing oxygen needs chest x-ray was remarkable for hypoventilation.  Pulmonary critical care was called to bedside for possible interventions.  He is able to vocalize he is not using accessory muscles at the time of examination.  But ends this weakened state and inability to take deep breaths and underlying COPD he may require intubation in the near future.  Past Medical History  End-stage renal disease COPD Diabetes Hypertension Hypothyroidism  Significant Hospital Events   11/09/2019 found to be hypoxic  Consults:  11/22/2019 nephrology for end-stage renal disease 11/09/2019 pulmonary critical care for hypoxia  Procedures:  11/13/2019 status post aortobifem and AAA repair  Significant Diagnostic Tests:  11/09/2019 chest x-ray with decreased excursion with bilateral atelectasis  Micro Data:    Antimicrobials:  Preop Ancef  Interim history/subjective:  69 year old male with end-stage renal disease aortic abdominal aneurysm status post repair 11/15/2019 with  reported increased work of breathing and hypoxia.  Pulmonary critical care called to bedside to evaluate.  Objective   Blood pressure (!) 93/51, pulse 83, temperature 98.6 F (37 C), temperature source Oral, resp. rate (!) 34, height 5\' 9"  (1.753 m), weight 75 kg, SpO2 95 %. CVP:  [2 mmHg-10 mmHg] 5 mmHg      Intake/Output Summary (Last 24 hours) at 11/09/2019 0910 Last data filed at 11/09/2019 0600 Gross per 24 hour  Intake 5464.88 ml  Output 1138 ml  Net 4326.88 ml   Filed Weights   11/13/2019 0626 11/09/2019 1600  Weight: 75 kg 75 kg    Examination: General: Thin male who does not appear in acute respiratory distress. HENT: No JVD or lymphadenopathy is appreciated Lungs: Decreased breath sounds throughout especially at the bases.  Currently on salter 12 L flow sats initially 100% and intermittently dropping will confirm whether this is machine or or man Cardiovascular: Heart sounds are regular regular rate and rhythm sinus rhythm 69 currently on Levophed with a blood pressure 118/49 Abdomen: Distended no bowel sounds are heard NG tube is in place with scant drainage.  Abdominal wound and femoral wounds with dressings intact Extremities: Positive dorsalis pedis pulses Neuro: Answers questions follow commands orientated but is somewhat stunned GU: End-stage renal disease he does not make urine  Resolved Hospital Problem list     Assessment & Plan:  Respiratory distress in the setting of lifelong smoker, recent extensive AAA aortic bifemoral repair on 11/17/2019 with increasing hypoxia requiring high flow FiO2. Oxygen as needed Pulmonary toilet as tolerated chest x-ray reveals low lung volumes May need intubation Volume reduction via hemodialysis or CRRT if possible although chest x-ray does not look wet at this time. We will utilize  noninvasive mechanical ventilatory support Repeat ABGs Consider CTA chest rule out PE but if he goes to radiology he needs to go intubated. CHECK 12 LEAD  Check trop 2 d echo    Aortic bifemoral along with AAA repair 11/07/2019 Per vascular surgeon Monitor abdominal girth Noted to have abdominal distention may be impeding ability to ventilate properly  End-stage renal disease with hemodialysis performed on 11/13/2019 via left bicep graft..  Now with hypotension following episode of hypertension. May need temporary hemodialysis catheter for CRRT We will follow nephrology's direction Potassium is normalized 11/09/2019 does not need hemodialysis at this time.  Have discussed this with nephrology.  Will reevaluate 11/24/2019 and if CRRT is needed will place temporary hemodialysis catheter. Recheck CBC to rule out blood loss anemia  Chronic hypertension now with hypotension Vasopressor support as needed Attempt to avoid fluid resuscitation and end-stage renal disease patient who is now hypotensive  Diabetes mellitus CBG (last 3)  Recent Labs    11/09/19 0329 11/09/19 0644 11/09/19 0838  GLUCAP 74 84 96    Sliding scale insulin protocol  History of hypothyroidism Check TSH May need Synthroid via IV route  Best practice:  Diet: NPO Pain/Anxiety/Delirium protocol (if indicated): Narcotics as ordered VAP protocol (if indicated): Not indicated currently DVT prophylaxis: In place GI prophylaxis: In place Glucose control: Sliding scale insulin protocol Mobility: Bedrest Code Status: Full Family Communication: Patient updated at the bedside 11/09/2019 Disposition: Intensive care unit status post AAA repair  Labs   CBC: Recent Labs  Lab 11/04/19 1000 11/27/2019 0705 11/15/2019 1247 11/09/2019 1305 11/06/2019 1334 11/13/2019 1941 11/09/19 0145  WBC 8.6  --   --   --  7.7 10.5 6.7  HGB 10.0*   < > 9.2* 9.2* 10.8* 12.3* 11.8*  HCT 32.0*   < > 27.0* 27.0* 32.9* 36.4* 34.7*  MCV 82.7  --   --   --  84.4 82.5 80.9  PLT 202  --   --   --  121* 123* 111*   < > = values in this interval not displayed.    Basic Metabolic Panel: Recent Labs  Lab  11/04/19 1000 11/04/19 1000 11/29/2019 0705 12/03/2019 0857 11/09/2019 1234 11/09/2019 1247 11/30/2019 1305 11/24/2019 1334 11/09/19 0145  NA 140   < > 139   < > 135 136 135 137 139  K 4.5   < > 4.0   < > 7.3* 7.4* 6.8* 5.8* 3.5  CL 99   < > 102  --   --  106 105 107 104  CO2 29  --   --   --   --   --   --  21* 23  GLUCOSE 216*   < > 90  --   --  169* 305* 202* 77  BUN 41*   < > 38*  --   --  42* 42* 43* 24*  CREATININE 8.17*   < > 8.20*  --   --  7.40* 7.30* 7.55* 5.14*  CALCIUM 9.3  --   --   --   --   --   --  8.1* 7.8*  MG  --   --   --   --   --   --   --  1.5* 1.5*  PHOS  --   --   --   --   --   --   --  6.0* 2.9   < > = values in this interval not displayed.  GFR: Estimated Creatinine Clearance: 13.9 mL/min (A) (by C-G formula based on SCr of 5.14 mg/dL (H)). Recent Labs  Lab 11/04/19 1000 11/20/2019 1334 11/09/2019 1941 11/09/19 0145  WBC 8.6 7.7 10.5 6.7    Liver Function Tests: Recent Labs  Lab 11/04/19 1000 11/09/19 0145  AST 24 303*  ALT 29 259*  ALKPHOS 62 38  BILITOT 0.9 1.7*  PROT 6.6 4.7*  ALBUMIN 3.9 2.8*   Recent Labs  Lab 11/09/19 0145  AMYLASE 258*   No results for input(s): AMMONIA in the last 168 hours.  ABG    Component Value Date/Time   PHART 7.326 (L) 11/19/2019 1325   PCO2ART 39.7 11/11/2019 1325   PO2ART 105 11/06/2019 1325   HCO3 20.1 11/18/2019 1325   TCO2 26 11/15/2019 1305   ACIDBASEDEF 4.8 (H) 11/25/2019 1325   O2SAT 97.3 11/07/2019 1325     Coagulation Profile: Recent Labs  Lab 11/04/19 1000 11/24/2019 1334  INR 1.0 1.3*    Cardiac Enzymes: No results for input(s): CKTOTAL, CKMB, CKMBINDEX, TROPONINI in the last 168 hours.  HbA1C: Hgb A1c MFr Bld  Date/Time Value Ref Range Status  12/02/2019 07:41 PM 6.7 (H) 4.8 - 5.6 % Final    Comment:    (NOTE) Pre diabetes:          5.7%-6.4% Diabetes:              >6.4% Glycemic control for   <7.0% adults with diabetes   11/04/2019 10:00 AM 7.9 (H) 4.8 - 5.6 % Final     Comment:    (NOTE) Pre diabetes:          5.7%-6.4% Diabetes:              >6.4% Glycemic control for   <7.0% adults with diabetes     CBG: Recent Labs  Lab 11/29/2019 1609 11/09/19 0136 11/09/19 0329 11/09/19 0644 11/09/19 0838  GLUCAP 193* 87 74 84 96    Review of Systems:   10 point review of system taken, please see HPI for positives and negatives.   Past Medical History  He,  has a past medical history of AAA (abdominal aortic aneurysm) ([place]), Chronic kidney disease, Diabetes mellitus without complication ([place]), Hypertension, Hypothyroidism, and PAD (peripheral artery disease) ([place]).   Surgical History    Past Surgical History:  Procedure Laterality Date  . A/V FISTULAGRAM Left 10/14/2018   Procedure: A/V FISTULAGRAM;  Surgeon: Marty Heck, MD;  Location: [place] [facility];  Service: Cardiovascular;  Laterality: Left;  arm  . ABDOMINAL AORTAGRAM N/A 02/22/2014   Procedure: ABDOMINAL Maxcine Ham;  Surgeon: Serafina Mitchell, MD;  Location[facility] CATH LAB;  Service: Cardiovascular;  Laterality: N/A;  . AV FISTULA PLACEMENT Left 08/06/2018   Procedure: ARTERIOVENOUS (AV) FISTULA CREATION;  Surgeon: Serafina Mitchell, MD;  Location: [place];  Service: Vascular;  Laterality: Left;  . BACK SURGERY    . PERIPHERAL VASCULAR BALLOON ANGIOPLASTY Left 10/14/2018   Procedure: PERIPHERAL VASCULAR BALLOON ANGIOPLASTY;  Surgeon: Marty Heck, MD;  Location: [place] [facility] LAB;  Service: Cardiovascular;  Laterality: Left;  ARM FISTULA  . SPINE SURGERY       Social History   reports that he has been smoking cigarettes. He has a 20.00 pack-year smoking history. He has never used smokeless tobacco. He reports current alcohol use of about 1.0 standard drinks of alcohol per week. He reports that he does not use drugs.   Family History   His family history  includes Deep vein thrombosis in his father; Hypertension in his brother, father, mother, and sister.   Allergies No Known  Allergies   Home Medications  Prior to Admission medications   Medication Sig Start Date End Date Taking? Authorizing Provider  aspirin EC 81 MG tablet Take 1 tablet (81 mg total) by mouth daily with breakfast. 03/19/19  Yes Emokpae, Courage, MD  Cholecalciferol (VITAMIN D3) 50 MCG (2000 UT) TABS Take 2,000 Units by mouth 4 (four) times a week.   Yes [provider]  glipiZIDE (GLUCOTROL) 5 MG tablet Take 5 mg by mouth 2 (two) times daily. 10/19/19  Yes [provider]  labetalol (NORMODYNE) 200 MG tablet Take 2 tablets (400 mg total) by mouth 2 (two) times daily. Patient taking differently: Take 200 mg by mouth 2 (two) times daily.  03/19/19  Yes Roxan Hockey, MD  levothyroxine (SYNTHROID) 50 MCG tablet Take 1 tablet (50 mcg total) by mouth daily before breakfast. 03/19/19  Yes Emokpae, Courage, MD  acetaminophen (TYLENOL) 325 MG tablet Take 2 tablets (650 mg total) by mouth every 6 (six) hours as needed for mild pain (or Fever >/= 101). 03/19/19   Roxan Hockey, MD     Critical care time: [place] Jaedin Regina ACNP Acute Care Nurse Practitioner [place] Please consult Amion 11/09/2019, 9:13 AM

## 2019-11-09 NOTE — Progress Notes (Signed)
Alerted MD [place] to elevated K of 7.1. Urgent meds were ordered and adminstered. Will continue to monitor

## 2019-11-09 NOTE — Progress Notes (Signed)
PT Cancellation Note  Patient Details Name: Juan Bass MRN: [id number] DOB: 16-Mar-1951   Cancelled Treatment:    Reason Eval/Treat Not Completed: Patient not medically ready(BP drop and WOB not currently appropriate)   Breanah Faddis B Darcy Cordner 11/09/2019, 8:56 AM  [name], PT Acute Rehabilitation Services Pager: [phone] Office: [phone]

## 2019-11-09 NOTE — Progress Notes (Signed)
[facility] Progress Note Patient Name: Juan Bass DOB: [birth year] MRN: [id number]   Date of Service  11/09/2019  HPI/Events of Note  Last Lactic Acid = 5.6 and K+ = 7.2.  eICU Interventions  Will order: 1. Lactic Acid and BMP STAT.     Intervention Category Major Interventions: Acid-Base disturbance - evaluation and management;Electrolyte abnormality - evaluation and management  Tyller Bowlby [place] 11/09/2019, 11:28 PM

## 2019-11-09 NOTE — Progress Notes (Signed)
VASCULAR SURGERY:  Called to see because of loss of Doppler signals in both feet.  Patient had a cardiac arrest postoperatively and is now intubated on Levophed and vasopressin.  He is also on CRT.   On my exam he has palpable femoral pulses.  I was able to obtain a posterior tibial and anterior tibial signal on the left although the left leg does have some mottling up into the thigh.  I am unable to obtain Doppler signals in the right foot he has some mottling in the right thigh also.  His aortofemoral bypass graft is patent.  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  I think his loss of signals is related to pressors.  His CVP is 5.  We will try some volume in hopes that we can wean him off the pressor some which should certainly help with his circulation.  I do not think there is any indication for return to the operating room at this point.  I have discussed this with the family member who was at the bedside.  Deitra Mayo, MD Office: [phone]

## 2019-11-09 NOTE — Progress Notes (Signed)
OT Cancellation Note  Patient Details Name: NEALE MARZETTE MRN: [id number] DOB: [birth year]   Cancelled Treatment:    Reason Eval/Treat Not Completed: Patient not medically ready(Pt on pressers and possible re-intubation. )   OT to continue to follow.  Jefferey Pica, OTR/L Acute Rehabilitation Services Pager: [phone] Office: [place] [facility] 11/09/2019, 9:06 AM

## 2019-11-10 ENCOUNTER — Inpatient Hospital Stay (HOSPITAL_COMMUNITY): Payer: PPO

## 2019-11-10 ENCOUNTER — Inpatient Hospital Stay (HOSPITAL_COMMUNITY): Payer: PPO | Admitting: Certified Registered"

## 2019-11-10 ENCOUNTER — Other Ambulatory Visit (HOSPITAL_COMMUNITY): Payer: PPO

## 2019-11-10 ENCOUNTER — Encounter (HOSPITAL_COMMUNITY): Admission: RE | Disposition: E | Payer: Self-pay | Source: Home / Self Care | Attending: Vascular Surgery

## 2019-11-10 DIAGNOSIS — I998 Other disorder of circulatory system: Secondary | ICD-10-CM

## 2019-11-10 DIAGNOSIS — I743 Embolism and thrombosis of arteries of the lower extremities: Secondary | ICD-10-CM

## 2019-11-10 DIAGNOSIS — J9601 Acute respiratory failure with hypoxia: Secondary | ICD-10-CM | POA: Diagnosis not present

## 2019-11-10 HISTORY — PX: APPLICATION OF WOUND VAC: SHX5189

## 2019-11-10 HISTORY — PX: FASCIOTOMY: SHX132

## 2019-11-10 HISTORY — PX: COLON RESECTION SIGMOID: SHX6737

## 2019-11-10 HISTORY — PX: THROMBECTOMY OF BYPASS GRAFT FEMORAL- POPLITEAL ARTERY: SHX6902

## 2019-11-10 HISTORY — PX: PATCH ANGIOPLASTY: SHX6230

## 2019-11-10 HISTORY — PX: ABDOMINAL AORTIC ANEURYSM REPAIR: SHX42

## 2019-11-10 LAB — POCT I-STAT 7, (LYTES, BLD GAS, ICA,H+H)
Acid-base deficit: 2 mmol/L (ref 0.0–2.0)
Acid-base deficit: 4 mmol/L — ABNORMAL HIGH (ref 0.0–2.0)
Acid-base deficit: 4 mmol/L — ABNORMAL HIGH (ref 0.0–2.0)
Acid-base deficit: 4 mmol/L — ABNORMAL HIGH (ref 0.0–2.0)
Acid-base deficit: 4 mmol/L — ABNORMAL HIGH (ref 0.0–2.0)
Acid-base deficit: 4 mmol/L — ABNORMAL HIGH (ref 0.0–2.0)
Bicarbonate: 23.5 mmol/L (ref 20.0–28.0)
Bicarbonate: 23.2 mmol/L (ref 20.0–28.0)
Bicarbonate: 22 mmol/L (ref 20.0–28.0)
Bicarbonate: 22.1 mmol/L (ref 20.0–28.0)
Bicarbonate: 22.3 mmol/L (ref 20.0–28.0)
Bicarbonate: 22.4 mmol/L (ref 20.0–28.0)
Calcium, Ion: 0.94 mmol/L — ABNORMAL LOW (ref 1.15–1.40)
Calcium, Ion: 0.92 mmol/L — ABNORMAL LOW (ref 1.15–1.40)
Calcium, Ion: 0.83 mmol/L — CL (ref 1.15–1.40)
Calcium, Ion: 1.02 mmol/L — ABNORMAL LOW (ref 1.15–1.40)
Calcium, Ion: 0.95 mmol/L — ABNORMAL LOW (ref 1.15–1.40)
Calcium, Ion: 0.93 mmol/L — ABNORMAL LOW (ref 1.15–1.40)
HCT: 28 % — ABNORMAL LOW (ref 39.0–52.0)
HCT: 27 % — ABNORMAL LOW (ref 39.0–52.0)
HCT: 25 % — ABNORMAL LOW (ref 39.0–52.0)
HCT: 23 % — ABNORMAL LOW (ref 39.0–52.0)
HCT: 30 % — ABNORMAL LOW (ref 39.0–52.0)
HCT: 29 % — ABNORMAL LOW (ref 39.0–52.0)
Hemoglobin: 9.5 g/dL — ABNORMAL LOW (ref 13.0–17.0)
Hemoglobin: 9.2 g/dL — ABNORMAL LOW (ref 13.0–17.0)
Hemoglobin: 8.5 g/dL — ABNORMAL LOW (ref 13.0–17.0)
Hemoglobin: 7.8 g/dL — ABNORMAL LOW (ref 13.0–17.0)
Hemoglobin: 10.2 g/dL — ABNORMAL LOW (ref 13.0–17.0)
Hemoglobin: 9.9 g/dL — ABNORMAL LOW (ref 13.0–17.0)
O2 Saturation: 95 %
O2 Saturation: 89 %
O2 Saturation: 91 %
O2 Saturation: 94 %
O2 Saturation: 90 %
O2 Saturation: 91 %
Patient temperature: 36.3
Patient temperature: 36.3
Patient temperature: 36.5
Patient temperature: 35.8
Patient temperature: 36.7
Potassium: 6 mmol/L — ABNORMAL HIGH (ref 3.5–5.1)
Potassium: 5.7 mmol/L — ABNORMAL HIGH (ref 3.5–5.1)
Potassium: 5.8 mmol/L — ABNORMAL HIGH (ref 3.5–5.1)
Potassium: 5.7 mmol/L — ABNORMAL HIGH (ref 3.5–5.1)
Potassium: 6.3 mmol/L (ref 3.5–5.1)
Potassium: 5.7 mmol/L — ABNORMAL HIGH (ref 3.5–5.1)
Sodium: 138 mmol/L (ref 135–145)
Sodium: 139 mmol/L (ref 135–145)
Sodium: 140 mmol/L (ref 135–145)
Sodium: 140 mmol/L (ref 135–145)
Sodium: 138 mmol/L (ref 135–145)
Sodium: 139 mmol/L (ref 135–145)
TCO2: 25 mmol/L (ref 22–32)
TCO2: 25 mmol/L (ref 22–32)
TCO2: 23 mmol/L (ref 22–32)
TCO2: 23 mmol/L (ref 22–32)
TCO2: 24 mmol/L (ref 22–32)
TCO2: 24 mmol/L (ref 22–32)
pCO2 arterial: 39.6 mmHg (ref 32.0–48.0)
pCO2 arterial: 50.2 mmHg — ABNORMAL HIGH (ref 32.0–48.0)
pCO2 arterial: 42.1 mmHg (ref 32.0–48.0)
pCO2 arterial: 41.8 mmHg (ref 32.0–48.0)
pCO2 arterial: 42.3 mmHg (ref 32.0–48.0)
pCO2 arterial: 43.8 mmHg (ref 32.0–48.0)
pH, Arterial: 7.379 (ref 7.350–7.450)
pH, Arterial: 7.273 — ABNORMAL LOW (ref 7.350–7.450)
pH, Arterial: 7.323 — ABNORMAL LOW (ref 7.350–7.450)
pH, Arterial: 7.329 — ABNORMAL LOW (ref 7.350–7.450)
pH, Arterial: 7.323 — ABNORMAL LOW (ref 7.350–7.450)
pH, Arterial: 7.316 — ABNORMAL LOW (ref 7.350–7.450)
pO2, Arterial: 76 mmHg — ABNORMAL LOW (ref 83.0–108.0)
pO2, Arterial: 64 mmHg — ABNORMAL LOW (ref 83.0–108.0)
pO2, Arterial: 63 mmHg — ABNORMAL LOW (ref 83.0–108.0)
pO2, Arterial: 75 mmHg — ABNORMAL LOW (ref 83.0–108.0)
pO2, Arterial: 60 mmHg — ABNORMAL LOW (ref 83.0–108.0)
pO2, Arterial: 67 mmHg — ABNORMAL LOW (ref 83.0–108.0)

## 2019-11-10 LAB — RENAL FUNCTION PANEL
Albumin: 3 g/dL — ABNORMAL LOW (ref 3.5–5.0)
Albumin: 2.8 g/dL — ABNORMAL LOW (ref 3.5–5.0)
Anion gap: 17 — ABNORMAL HIGH (ref 5–15)
Anion gap: 14 (ref 5–15)
BUN: 33 mg/dL — ABNORMAL HIGH (ref 8–23)
BUN: 37 mg/dL — ABNORMAL HIGH (ref 8–23)
CO2: 20 mmol/L — ABNORMAL LOW (ref 22–32)
CO2: 21 mmol/L — ABNORMAL LOW (ref 22–32)
Calcium: 8 mg/dL — ABNORMAL LOW (ref 8.9–10.3)
Calcium: 7 mg/dL — ABNORMAL LOW (ref 8.9–10.3)
Chloride: 104 mmol/L (ref 98–111)
Chloride: 105 mmol/L (ref 98–111)
Creatinine, Ser: 5.28 mg/dL — ABNORMAL HIGH (ref 0.61–1.24)
Creatinine, Ser: 4.74 mg/dL — ABNORMAL HIGH (ref 0.61–1.24)
GFR calc Af Amer: 12 mL/min — ABNORMAL LOW (ref >60)
GFR calc Af Amer: 14 mL/min — ABNORMAL LOW (ref >60)
GFR calc non Af Amer: 10 mL/min — ABNORMAL LOW (ref >60)
GFR calc non Af Amer: 12 mL/min — ABNORMAL LOW (ref >60)
Glucose, Bld: 66 mg/dL — ABNORMAL LOW (ref 70–99)
Glucose, Bld: 104 mg/dL — ABNORMAL HIGH (ref 70–99)
Phosphorus: 8.6 mg/dL — ABNORMAL HIGH (ref 2.5–4.6)
Phosphorus: 8.7 mg/dL — ABNORMAL HIGH (ref 2.5–4.6)
Potassium: 6.7 mmol/L (ref 3.5–5.1)
Potassium: 5.9 mmol/L — ABNORMAL HIGH (ref 3.5–5.1)
Sodium: 141 mmol/L (ref 135–145)
Sodium: 140 mmol/L (ref 135–145)

## 2019-11-10 LAB — HEPATIC FUNCTION PANEL
ALT: 1016 U/L — ABNORMAL HIGH (ref 0–44)
AST: 1810 U/L — ABNORMAL HIGH (ref 15–41)
Albumin: 3.1 g/dL — ABNORMAL LOW (ref 3.5–5.0)
Alkaline Phosphatase: 52 U/L (ref 38–126)
Bilirubin, Direct: 1.2 mg/dL — ABNORMAL HIGH (ref 0.0–0.2)
Indirect Bilirubin: 1 mg/dL — ABNORMAL HIGH (ref 0.3–0.9)
Total Bilirubin: 2.2 mg/dL — ABNORMAL HIGH (ref 0.3–1.2)
Total Protein: 5.1 g/dL — ABNORMAL LOW (ref 6.5–8.1)

## 2019-11-10 LAB — BASIC METABOLIC PANEL
Anion gap: 16 — ABNORMAL HIGH (ref 5–15)
Anion gap: 16 — ABNORMAL HIGH (ref 5–15)
BUN: 33 mg/dL — ABNORMAL HIGH (ref 8–23)
BUN: 33 mg/dL — ABNORMAL HIGH (ref 8–23)
CO2: 17 mmol/L — ABNORMAL LOW (ref 22–32)
CO2: 20 mmol/L — ABNORMAL LOW (ref 22–32)
Calcium: 7.3 mg/dL — ABNORMAL LOW (ref 8.9–10.3)
Calcium: 7.4 mg/dL — ABNORMAL LOW (ref 8.9–10.3)
Chloride: 105 mmol/L (ref 98–111)
Chloride: 103 mmol/L (ref 98–111)
Creatinine, Ser: 5.38 mg/dL — ABNORMAL HIGH (ref 0.61–1.24)
Creatinine, Ser: 5.08 mg/dL — ABNORMAL HIGH (ref 0.61–1.24)
GFR calc Af Amer: 12 mL/min — ABNORMAL LOW (ref >60)
GFR calc Af Amer: 13 mL/min — ABNORMAL LOW (ref >60)
GFR calc non Af Amer: 10 mL/min — ABNORMAL LOW (ref >60)
GFR calc non Af Amer: 11 mL/min — ABNORMAL LOW (ref >60)
Glucose, Bld: 89 mg/dL (ref 70–99)
Glucose, Bld: 85 mg/dL (ref 70–99)
Potassium: >7.5 mmol/L (ref 3.5–5.1)
Potassium: 6.2 mmol/L — ABNORMAL HIGH (ref 3.5–5.1)
Sodium: 138 mmol/L (ref 135–145)
Sodium: 139 mmol/L (ref 135–145)

## 2019-11-10 LAB — CBC
HCT: 31.6 % — ABNORMAL LOW (ref 39.0–52.0)
Hemoglobin: 10.4 g/dL — ABNORMAL LOW (ref 13.0–17.0)
MCH: 28 pg (ref 26.0–34.0)
MCHC: 32.9 g/dL (ref 30.0–36.0)
MCV: 84.9 fL (ref 80.0–100.0)
Platelets: 89 10*3/uL — ABNORMAL LOW (ref 150–400)
RBC: 3.72 MIL/uL — ABNORMAL LOW (ref 4.22–5.81)
RDW: 16.6 % — ABNORMAL HIGH (ref 11.5–15.5)
WBC: 3.9 10*3/uL — ABNORMAL LOW (ref 4.0–10.5)
nRBC: 0.8 % — ABNORMAL HIGH (ref 0.0–0.2)

## 2019-11-10 LAB — CBC WITH DIFFERENTIAL/PLATELET
Abs Immature Granulocytes: 0.06 10*3/uL (ref 0.00–0.07)
Basophils Absolute: 0 10*3/uL (ref 0.0–0.1)
Basophils Relative: 1 %
Eosinophils Absolute: 0 10*3/uL (ref 0.0–0.5)
Eosinophils Relative: 0 %
HCT: 29.7 % — ABNORMAL LOW (ref 39.0–52.0)
Hemoglobin: 9.6 g/dL — ABNORMAL LOW (ref 13.0–17.0)
Immature Granulocytes: 1 %
Lymphocytes Relative: 13 %
Lymphs Abs: 0.8 10*3/uL (ref 0.7–4.0)
MCH: 27.5 pg (ref 26.0–34.0)
MCHC: 32.3 g/dL (ref 30.0–36.0)
MCV: 85.1 fL (ref 80.0–100.0)
Monocytes Absolute: 0.3 10*3/uL (ref 0.1–1.0)
Monocytes Relative: 4 %
Neutro Abs: 5.1 10*3/uL (ref 1.7–7.7)
Neutrophils Relative %: 81 %
Platelets: 89 10*3/uL — ABNORMAL LOW (ref 150–400)
RBC: 3.49 MIL/uL — ABNORMAL LOW (ref 4.22–5.81)
RDW: 18 % — ABNORMAL HIGH (ref 11.5–15.5)
WBC Morphology: INCREASED
WBC: 6.3 10*3/uL (ref 4.0–10.5)
nRBC: 0.5 % — ABNORMAL HIGH (ref 0.0–0.2)

## 2019-11-10 LAB — COMPREHENSIVE METABOLIC PANEL
ALT: 848 U/L — ABNORMAL HIGH (ref 0–44)
AST: 2133 U/L — ABNORMAL HIGH (ref 15–41)
Albumin: 2.6 g/dL — ABNORMAL LOW (ref 3.5–5.0)
Alkaline Phosphatase: 52 U/L (ref 38–126)
Anion gap: 17 — ABNORMAL HIGH (ref 5–15)
BUN: 39 mg/dL — ABNORMAL HIGH (ref 8–23)
CO2: 19 mmol/L — ABNORMAL LOW (ref 22–32)
Calcium: 7.1 mg/dL — ABNORMAL LOW (ref 8.9–10.3)
Chloride: 103 mmol/L (ref 98–111)
Creatinine, Ser: 5.17 mg/dL — ABNORMAL HIGH (ref 0.61–1.24)
GFR calc Af Amer: 12 mL/min — ABNORMAL LOW (ref >60)
GFR calc non Af Amer: 11 mL/min — ABNORMAL LOW (ref >60)
Glucose, Bld: 88 mg/dL (ref 70–99)
Potassium: 6.4 mmol/L (ref 3.5–5.1)
Sodium: 139 mmol/L (ref 135–145)
Total Bilirubin: 2 mg/dL — ABNORMAL HIGH (ref 0.3–1.2)
Total Protein: 4.5 g/dL — ABNORMAL LOW (ref 6.5–8.1)

## 2019-11-10 LAB — LACTIC ACID, PLASMA
Lactic Acid, Venous: 8.1 mmol/L (ref 0.5–1.9)
Lactic Acid, Venous: 6.2 mmol/L (ref 0.5–1.9)
Lactic Acid, Venous: 5.5 mmol/L (ref 0.5–1.9)

## 2019-11-10 LAB — PHOSPHORUS: Phosphorus: 7.6 mg/dL — ABNORMAL HIGH (ref 2.5–4.6)

## 2019-11-10 LAB — POCT ACTIVATED CLOTTING TIME: Activated Clotting Time: 268 seconds

## 2019-11-10 LAB — MAGNESIUM: Magnesium: 2.4 mg/dL (ref 1.7–2.4)

## 2019-11-10 LAB — PROTIME-INR
INR: 2.1 — ABNORMAL HIGH (ref 0.8–1.2)
INR: 2.1 — ABNORMAL HIGH (ref 0.8–1.2)
Prothrombin Time: 22.6 seconds — ABNORMAL HIGH (ref 11.4–15.2)
Prothrombin Time: 22.9 seconds — ABNORMAL HIGH (ref 11.4–15.2)

## 2019-11-10 LAB — GLUCOSE, CAPILLARY
Glucose-Capillary: 102 mg/dL — ABNORMAL HIGH (ref 70–99)
Glucose-Capillary: 88 mg/dL (ref 70–99)
Glucose-Capillary: 88 mg/dL (ref 70–99)
Glucose-Capillary: 96 mg/dL (ref 70–99)
Glucose-Capillary: 96 mg/dL (ref 70–99)
Glucose-Capillary: 98 mg/dL (ref 70–99)

## 2019-11-10 LAB — COOXEMETRY PANEL
Carboxyhemoglobin: 0.3 % — ABNORMAL LOW (ref 0.5–1.5)
Methemoglobin: 0.9 % (ref 0.0–1.5)
O2 Saturation: 67.4 %
Total hemoglobin: 8.9 g/dL — ABNORMAL LOW (ref 12.0–16.0)

## 2019-11-10 LAB — PREPARE RBC (CROSSMATCH)

## 2019-11-10 SURGERY — THROMBECTOMY OF BYPASS GRAFT FEMORAL-POPLITEAL ARTERY
Anesthesia: General | Site: Leg Lower | Laterality: Right

## 2019-11-10 MED ORDER — PRISMASOL BGK 0/2.5 32-2.5 MEQ/L REPLACEMENT SOLN
Status: DC
  Filled 2019-11-10 (×9): qty 5000

## 2019-11-10 MED ORDER — SODIUM CHLORIDE 0.9 % IV SOLN
INTRAVENOUS | Status: AC
  Filled 2019-11-10: qty 1.2

## 2019-11-10 MED ORDER — FENTANYL CITRATE (PF) 250 MCG/5ML IJ SOLN
INTRAMUSCULAR | Status: AC
  Filled 2019-11-10: qty 5

## 2019-11-10 MED ORDER — PROPOFOL 10 MG/ML IV BOLUS
INTRAVENOUS | Status: AC
  Filled 2019-11-10: qty 20

## 2019-11-10 MED ORDER — SODIUM CHLORIDE 0.9 % IV SOLN
INTRAVENOUS | Status: DC | PRN
  Administered 2019-11-10 (×2): 500 mL

## 2019-11-10 MED ORDER — SODIUM BICARBONATE 8.4 % IV SOLN
50.0000 meq | Freq: Once | INTRAVENOUS | Status: DC
  Filled 2019-11-10: qty 50

## 2019-11-10 MED ORDER — SODIUM CHLORIDE 0.9% IV SOLUTION: Freq: Once | INTRAVENOUS | Status: AC

## 2019-11-10 MED ORDER — SODIUM CHLORIDE 0.9 % IV SOLN: INTRAVENOUS | Status: DC | PRN

## 2019-11-10 MED ORDER — PROTAMINE SULFATE 10 MG/ML IV SOLN
INTRAVENOUS | Status: DC | PRN
  Administered 2019-11-10: 50 mg via INTRAVENOUS

## 2019-11-10 MED ORDER — MIDAZOLAM HCL 5 MG/5ML IJ SOLN
INTRAMUSCULAR | Status: DC | PRN
Start: 2019-11-10 — End: 2019-11-10
  Administered 2019-11-10: 1 mg via INTRAVENOUS

## 2019-11-10 MED ORDER — ROCURONIUM BROMIDE 10 MG/ML (PF) SYRINGE
PREFILLED_SYRINGE | INTRAVENOUS | Status: DC | PRN
  Administered 2019-11-10: 40 mg via INTRAVENOUS
  Administered 2019-11-10: 80 mg via INTRAVENOUS
  Administered 2019-11-10: 30 mg via INTRAVENOUS

## 2019-11-10 MED ORDER — EPINEPHRINE HCL 5 MG/250ML IV SOLN IN NS: 0.5000 ug/min | INTRAVENOUS | Status: DC

## 2019-11-10 MED ORDER — 0.9 % SODIUM CHLORIDE (POUR BTL) OPTIME
TOPICAL | Status: DC | PRN
  Administered 2019-11-10 (×4): 1000 mL

## 2019-11-10 MED ORDER — PHENYLEPHRINE 40 MCG/ML (10ML) SYRINGE FOR IV PUSH (FOR BLOOD PRESSURE SUPPORT)
PREFILLED_SYRINGE | INTRAVENOUS | Status: DC | PRN
  Administered 2019-11-10: 120 ug via INTRAVENOUS

## 2019-11-10 MED ORDER — CALCIUM CHLORIDE 10 % IV SOLN
INTRAVENOUS | Status: DC | PRN
  Administered 2019-11-10 (×3): 100 mg via INTRAVENOUS
  Administered 2019-11-10: 200 mg via INTRAVENOUS
  Administered 2019-11-10: 100 mg via INTRAVENOUS
  Administered 2019-11-10: 200 mg via INTRAVENOUS
  Administered 2019-11-10: 100 mg via INTRAVENOUS
  Administered 2019-11-10: 200 mg via INTRAVENOUS
  Administered 2019-11-10: 100 mg via INTRAVENOUS

## 2019-11-10 MED ORDER — CALCIUM GLUCONATE-NACL 1-0.675 GM/50ML-% IV SOLN: 1.0000 g | Freq: Once | INTRAVENOUS | Status: DC

## 2019-11-10 MED ORDER — MIDAZOLAM HCL 2 MG/2ML IJ SOLN
INTRAMUSCULAR | Status: AC
  Filled 2019-11-10: qty 2

## 2019-11-10 MED ORDER — ORAL CARE MOUTH RINSE
15.0000 mL | OROMUCOSAL | Status: DC
  Administered 2019-11-10 – 2019-11-16 (×63): 15 mL via OROMUCOSAL

## 2019-11-10 MED ORDER — DEXTROSE 50 % IV SOLN
INTRAVENOUS | Status: AC
  Filled 2019-11-10: qty 50

## 2019-11-10 MED ORDER — HEMOSTATIC AGENTS (NO CHARGE) OPTIME
TOPICAL | Status: DC | PRN
  Administered 2019-11-10: 1 via TOPICAL

## 2019-11-10 MED ORDER — INSULIN ASPART 100 UNIT/ML ~~LOC~~ SOLN: 0.0000 [IU] | Freq: Four times a day (QID) | SUBCUTANEOUS | Status: DC

## 2019-11-10 MED ORDER — PRISMASOL BGK 0/2.5 32-2.5 MEQ/L REPLACEMENT SOLN
Status: DC
  Filled 2019-11-10 (×6): qty 5000

## 2019-11-10 MED ORDER — CHLORHEXIDINE GLUCONATE 0.12 % MT SOLN
15.0000 mL | Freq: Two times a day (BID) | OROMUCOSAL | Status: DC
  Administered 2019-11-10 – 2019-11-16 (×12): 15 mL via OROMUCOSAL
  Filled 2019-11-10: qty 15

## 2019-11-10 MED ORDER — PRISMASOL BGK 0/2.5 32-2.5 MEQ/L IV SOLN
INTRAVENOUS | Status: DC
  Filled 2019-11-10 (×18): qty 5000

## 2019-11-10 MED ORDER — DEXTROSE-NACL 5-0.9 % IV SOLN: INTRAVENOUS | Status: AC

## 2019-11-10 MED ORDER — CALCIUM GLUCONATE-NACL 2-0.675 GM/100ML-% IV SOLN
2.0000 g | Freq: Once | INTRAVENOUS | Status: AC
  Administered 2019-11-10: 2000 mg via INTRAVENOUS
  Filled 2019-11-10: qty 100

## 2019-11-10 MED ORDER — ALBUMIN HUMAN 5 % IV SOLN
12.5000 g | Freq: Once | INTRAVENOUS | Status: AC
  Administered 2019-11-10: 12.5 g via INTRAVENOUS
  Filled 2019-11-10: qty 250

## 2019-11-10 MED ORDER — HEPARIN SODIUM (PORCINE) 1000 UNIT/ML IJ SOLN
INTRAMUSCULAR | Status: DC | PRN
  Administered 2019-11-10: 9000 [IU] via INTRAVENOUS

## 2019-11-10 MED FILL — Medication: Qty: 1 | Status: AC

## 2019-11-10 SURGICAL SUPPLY — 102 items
ADH SKN CLS APL DERMABOND .7 (GAUZE/BANDAGES/DRESSINGS) ×3
AGENT HMST SPONGE THK3/8 (HEMOSTASIS)
BAG ISL DRAPE 18X18 STRL (DRAPES) ×6
BAG ISOLATION DRAPE 18X18 (DRAPES) IMPLANT
BANDAGE ESMARK 6X9 LF (GAUZE/BANDAGES/DRESSINGS) IMPLANT
BLADE CLIPPER SURG (BLADE) ×3 IMPLANT
BNDG CMPR 9X6 STRL LF SNTH (GAUZE/BANDAGES/DRESSINGS)
BNDG ESMARK 6X9 LF (GAUZE/BANDAGES/DRESSINGS)
CANISTER SUCT 3000ML PPV (MISCELLANEOUS) ×5 IMPLANT
CANISTER WOUND CARE 500ML ATS (WOUND CARE) ×2 IMPLANT
CATH EMB 3FR 80CM (CATHETERS) ×4 IMPLANT
CATH EMB 4FR 80CM (CATHETERS) ×4 IMPLANT
CATH EMB 5FR 80CM (CATHETERS) IMPLANT
CLIP FOGARTY SPRING 6M (CLIP) ×5 IMPLANT
CLIP VESOCCLUDE MED 24/CT (CLIP) ×5 IMPLANT
CLIP VESOCCLUDE MED 6/CT (CLIP) ×4 IMPLANT
CLIP VESOCCLUDE SM WIDE 24/CT (CLIP) ×5 IMPLANT
CLIP VESOCCLUDE SM WIDE 6/CT (CLIP) ×4 IMPLANT
COVER PROBE W GEL 5X96 (DRAPES) ×3 IMPLANT
COVER SURGICAL LIGHT HANDLE (MISCELLANEOUS) ×4 IMPLANT
COVER WAND RF STERILE (DRAPES) ×3 IMPLANT
CUFF TOURN SGL QUICK 24 (TOURNIQUET CUFF)
CUFF TOURN SGL QUICK 34 (TOURNIQUET CUFF)
CUFF TOURN SGL QUICK 42 (TOURNIQUET CUFF) IMPLANT
CUFF TRNQT CYL 24X4X16.5-23 (TOURNIQUET CUFF) IMPLANT
CUFF TRNQT CYL 34X4.125X (TOURNIQUET CUFF) IMPLANT
DERMABOND ADVANCED (GAUZE/BANDAGES/DRESSINGS) ×2
DERMABOND ADVANCED .7 DNX12 (GAUZE/BANDAGES/DRESSINGS) ×3 IMPLANT
DRAIN CHANNEL 15F RND FF W/TCR (WOUND CARE) IMPLANT
DRAPE C-ARM 42X72 X-RAY (DRAPES) ×3 IMPLANT
DRAPE HALF SHEET 40X57 (DRAPES) IMPLANT
DRAPE ISOLATION BAG 18X18 (DRAPES) ×10
DRSG COVADERM 4X10 (GAUZE/BANDAGES/DRESSINGS) ×4 IMPLANT
ELECT BLADE 4.0 EZ CLEAN MEGAD (MISCELLANEOUS) ×5
ELECT BLADE 6.5 EXT (BLADE) IMPLANT
ELECT REM PT RETURN 9FT ADLT (ELECTROSURGICAL) ×5
ELECTRODE BLDE 4.0 EZ CLN MEGD (MISCELLANEOUS) ×3 IMPLANT
ELECTRODE REM PT RTRN 9FT ADLT (ELECTROSURGICAL) ×3 IMPLANT
EVACUATOR SILICONE 100CC (DRAIN) IMPLANT
FELT TEFLON 1X6 (MISCELLANEOUS) ×3 IMPLANT
GLOVE BIO SURGEON STRL SZ7.5 (GLOVE) ×11 IMPLANT
GLOVE BIOGEL PI IND STRL 8 (GLOVE) ×3 IMPLANT
GLOVE BIOGEL PI INDICATOR 8 (GLOVE) ×4
GOWN STRL REUS W/ TWL LRG LVL3 (GOWN DISPOSABLE) ×9 IMPLANT
GOWN STRL REUS W/ TWL XL LVL3 (GOWN DISPOSABLE) ×6 IMPLANT
GOWN STRL REUS W/TWL LRG LVL3 (GOWN DISPOSABLE) ×15
GOWN STRL REUS W/TWL XL LVL3 (GOWN DISPOSABLE) ×20
HANDLE SUCTION POOLE (INSTRUMENTS) IMPLANT
HEMOSTAT SNOW SURGICEL 2X4 (HEMOSTASIS) ×2 IMPLANT
HEMOSTAT SPONGE AVITENE ULTRA (HEMOSTASIS) IMPLANT
INSERT FOGARTY 61MM (MISCELLANEOUS) ×6 IMPLANT
INSERT FOGARTY SM (MISCELLANEOUS) ×12 IMPLANT
KIT BASIN OR (CUSTOM PROCEDURE TRAY) ×7 IMPLANT
KIT TURNOVER KIT B (KITS) ×5 IMPLANT
LIGASURE IMPACT 36 18CM CVD LR (INSTRUMENTS) ×2 IMPLANT
LOOP VESSEL MINI RED (MISCELLANEOUS) ×2 IMPLANT
NS IRRIG 1000ML POUR BTL (IV SOLUTION) ×16 IMPLANT
PACK AORTA (CUSTOM PROCEDURE TRAY) ×5 IMPLANT
PACK PERIPHERAL VASCULAR (CUSTOM PROCEDURE TRAY) ×5 IMPLANT
PAD ARMBOARD 7.5X6 YLW CONV (MISCELLANEOUS) ×6 IMPLANT
PATCH VASC XENOSURE 1CMX6CM (Vascular Products) ×5 IMPLANT
PATCH VASC XENOSURE 1X6 (Vascular Products) IMPLANT
RELOAD PROXIMATE 75MM BLUE (ENDOMECHANICALS) ×5 IMPLANT
RELOAD STAPLE 75 3.8 BLU REG (ENDOMECHANICALS) IMPLANT
RETAINER VISCERA MED (MISCELLANEOUS) ×3 IMPLANT
SET MICROPUNCTURE 5F STIFF (MISCELLANEOUS) IMPLANT
SPONGE ABD ABTHERA ADVANCE (MISCELLANEOUS) ×2 IMPLANT
STAPLER CUT CVD 40MM BLUE (STAPLE) ×2 IMPLANT
STAPLER CUT RELOAD BLUE (STAPLE) ×4 IMPLANT
STAPLER PROXIMATE 75MM BLUE (STAPLE) ×2 IMPLANT
STAPLER VISISTAT 35W (STAPLE) ×14 IMPLANT
STOPCOCK 4 WAY LG BORE MALE ST (IV SETS) IMPLANT
SUCTION POOLE HANDLE (INSTRUMENTS) ×5
SUT ETHIBOND 5 LR DA (SUTURE) IMPLANT
SUT ETHILON 3 0 PS 1 (SUTURE) IMPLANT
SUT GORETEX 5 0 TT13 24 (SUTURE) IMPLANT
SUT GORETEX 6.0 TT13 (SUTURE) IMPLANT
SUT MNCRL AB 4-0 PS2 18 (SUTURE) ×13 IMPLANT
SUT PDS AB 1 TP1 96 (SUTURE) ×6 IMPLANT
SUT PROLENE 3 0 SH1 36 (SUTURE) ×3 IMPLANT
SUT PROLENE 5 0 C 1 24 (SUTURE) ×11 IMPLANT
SUT PROLENE 5 0 C 1 36 (SUTURE) ×2 IMPLANT
SUT PROLENE 6 0 BV (SUTURE) ×7 IMPLANT
SUT PROLENE 7 0 BV 1 (SUTURE) IMPLANT
SUT SILK 2 0 PERMA HAND 18 BK (SUTURE) IMPLANT
SUT SILK 2 0 SH CR/8 (SUTURE) ×3 IMPLANT
SUT SILK 2 0SH CR/8 30 (SUTURE) ×2 IMPLANT
SUT SILK 3 0 (SUTURE)
SUT SILK 3-0 18XBRD TIE 12 (SUTURE) IMPLANT
SUT VIC AB 2-0 CT1 27 (SUTURE) ×5
SUT VIC AB 2-0 CT1 36 (SUTURE) ×6 IMPLANT
SUT VIC AB 2-0 CT1 TAPERPNT 27 (SUTURE) ×6 IMPLANT
SUT VIC AB 3-0 SH 27 (SUTURE) ×15
SUT VIC AB 3-0 SH 27X BRD (SUTURE) ×9 IMPLANT
SYR 3ML LL SCALE MARK (SYRINGE) ×7 IMPLANT
TAPE UMBILICAL COTTON 1/8X30 (MISCELLANEOUS) IMPLANT
TOWEL GREEN STERILE (TOWEL DISPOSABLE) ×9 IMPLANT
TOWEL SURG RFD BLUE STRL DISP (DISPOSABLE) ×6 IMPLANT
TRAY FOLEY MTR SLVR 16FR STAT (SET/KITS/TRAYS/PACK) ×3 IMPLANT
TUBING EXTENTION W/L.L. (IV SETS) IMPLANT
UNDERPAD 30X30 (UNDERPADS AND DIAPERS) ×5 IMPLANT
WATER STERILE IRR 1000ML POUR (IV SOLUTION) ×8 IMPLANT

## 2019-11-10 NOTE — Progress Notes (Signed)
[facility] Progress Note Patient Name: NUNZIO BANET DOB: [birth year] MRN: [id number]   Date of Service  11/11/2019  HPI/Events of Note  Multiple issues: 1. Hyperkalenia - K+ > 7.5 - Patient on CRRT. Nephrology is addressing hyperkalemia, 2. Lactic Acid = 5.6 --> 8.1. CI on Flow track = 2+ --> 1.6 and 3. Hypoglycemia - Blood glucose = 47 and 33. CVP = 7 and Hgb = 8.2.   eICU Interventions  Will order: 1. 5% Albumin 12.5 gm IV now.  2. Change IV fluid to D5 0.9 NaCl to run IV at 75 mL/hour.      Intervention Category Major Interventions: Acid-Base disturbance - evaluation and management;Electrolyte abnormality - evaluation and management  Zsazsa Bahena [place] 12/03/2019, 1:00 AM

## 2019-11-10 NOTE — Progress Notes (Signed)
[name], male   DOB: [birth year], 69 y.o.   MRN: [id number] S: Pt had brief cardiac arrest yesterday and was intubated and started on CRRT.  His clinical course continued to deteriorate and developed refractory shock, hyperkalemia, lactic acidosis, and then his right leg became mottled.  He was taken urgently to the or this morning for exploratory lap which revealed ischemic left colon and underwent left colectomy with abdominal wound vac in place.  He also underwent emergent thrombectomy of ischemic right leg and fasciotomies x 4.  He is now back in the ICU and CRRT was resumed with improved hemodynamics. O:BP 137/66   Pulse 82   Temp (!) 96.6 F (35.9 C)   Resp 18   Ht 5\' 9"  (1.753 m)   Wt 81.9 kg   SpO2 96%   BMI 26.66 kg/m   Intake/Output Summary (Last 24 hours) at 11/30/2019 1237 Last data filed at 12/02/2019 1115 Gross per 24 hour  Intake 4522.62 ml  Output 2626 ml  Net 1896.62 ml   Intake/Output: I/O last 3 completed shifts: In: 4416.4 [I.V.:3656.2; NG/GT:10; IV Piggyback:750.3] Out: 2131 [Urine:175; Emesis/NG output:50; Other:1906]  Intake/Output this shift:  Total I/O In: 1926.2 [I.V.:397.2; Blood:1429; IV Piggyback:100] Out: 748 [Other:398; Blood:350] Weight change: 6.9 kg Gen: intubated and sedated CVS: RRR Resp: occ rhonchi Abd: wound vac in place Ext: mottled right leg  Recent Labs  Lab 11/04/19 1000 11/07/2019 0705 11/17/2019 1334 11/07/2019 1334 11/09/19 0145 11/09/19 0759 11/09/19 0949 11/09/19 1039 11/09/19 1421 11/09/19 1721 11/09/19 2302 11/09/19 2302 12/03/2019 0008 11/09/2019 0305 11/06/2019 0531 11/15/2019 0541 11/20/2019 0843 12/01/2019 0941 11/18/2019 1038  NA 140   < > 137   < > 139   < > 138   < > 141   < > 138   < > 138 141 139 138 139 140 140  K 4.5   < > 5.8*   < > 3.5   < > 4.9   < > 6.0*   < > >7.5*   < > >7.5* 6.7* 6.2* 6.0* 5.7* 5.8* 5.7*  CL 99   < > 107   < > 104  --  105  --  105  --  105  --  105 104 103  --   --   --   --    CO2 29  --  21*   < > 23  --  23  --  21*  --  19*  --  17* 20* 20*  --   --   --   --   GLUCOSE 216*   < > 202*   < > 77  --  110*  --  118*  --  131*  --  89 66* 85  --   --   --   --   BUN 41*   < > 43*   < > 24*  --  31*  --  38*  --  33*  --  33* 33* 33*  --   --   --   --   CREATININE 8.17*   < > 7.55*   < > 5.14*  --  6.62*  --  7.14*  --  5.47*  --  5.38* 5.28* 5.08*  --   --   --   --   ALBUMIN 3.9  --   --   --  2.8*  --   --   --   --   --  2.2*  --   --  3.0* 3.1*  --   --   --   --   CALCIUM 9.3  --  8.1*   < > 7.8*  --  7.5*  --  7.3*  --  7.2*  --  7.3* 8.0* 7.4*  --   --   --   --   PHOS  --   --  6.0*  --  2.9  --   --   --   --   --  8.4*  --   --  8.6* 7.6*  --   --   --   --   AST 24  --   --   --  303*  --   --   --   --   --   --   --   --   --  1,810*  --   --   --   --   ALT 29  --   --   --  259*  --   --   --   --   --   --   --   --   --  1,016*  --   --   --   --    < > = values in this interval not displayed.   Liver Function Tests: Recent Labs  Lab 11/04/19 1000 11/04/19 1000 11/09/19 0145 11/09/19 0145 11/09/19 2302 11/07/2019 0305 11/30/2019 0531  AST 24  --  303*  --   --   --  1,810*  ALT 29  --  259*  --   --   --  1,016*  ALKPHOS 62  --  38  --   --   --  52  BILITOT 0.9  --  1.7*  --   --   --  2.2*  PROT 6.6  --  4.7*  --   --   --  5.1*  ALBUMIN 3.9   < > 2.8*   < > 2.2* 3.0* 3.1*   < > = values in this interval not displayed.   Recent Labs  Lab 11/09/19 0145  AMYLASE 258*   No results for input(s): AMMONIA in the last 168 hours. CBC: Recent Labs  Lab 11/20/2019 1941 11/25/2019 1941 11/09/19 0145 11/09/19 0759 11/09/19 0949 11/09/19 1039 11/09/19 1421 11/09/19 1721 11/26/2019 0531 11/26/2019 0541 11/09/2019 0843 11/30/2019 0941 11/25/2019 1038  WBC 10.5   < > 6.7  --  11.5*  --  11.8*  --  6.3  --   --   --   --   NEUTROABS  --   --   --   --   --   --   --   --  5.1  --   --   --   --   HGB 12.3*   < > 11.8*   < > 11.5*   < > 11.5*   < >  9.6*   < > 9.2* 8.5* 7.8*  HCT 36.4*   < > 34.7*   < > 34.5*   < > 34.7*   < > 29.7*   < > 27.0* 25.0* 23.0*  MCV 82.5  --  80.9  --  83.9  --  84.6  --  85.1  --   --   --   --   PLT 123*   < > 111*  --  107*  --  103*  --  89*  --   --   --   --    < > =  values in this interval not displayed.   Cardiac Enzymes: No results for input(s): CKTOTAL, CKMB, CKMBINDEX, TROPONINI in the last 168 hours. CBG: Recent Labs  Lab 11/09/19 2149 11/09/19 2208 11/09/19 2355 11/24/2019 0006 11/07/2019 0756  GLUCAP 47* 250* 33* 88 98    Iron Studies: No results for input(s): IRON, TIBC, TRANSFERRIN, FERRITIN in the last 72 hours. Studies/Results: DG Abd 1 View  Result Date: 11/09/2019 CLINICAL DATA:  Abdominal distension EXAM: ABDOMEN - 1 VIEW COMPARISON:  09/08/2019 FINDINGS: NG tube extends into the stomach. There is gas and stool in the rectosigmoid colon which is somewhat featureless. No small bowel obstruction identified. IMPRESSION: NG tube in stomach.  Prominent gas and stool-filled sigmoid colon. Electronically Signed   By: Suzy Bouchard M.D.   On: 11/09/2019 08:54   DG Chest Port 1 View  Result Date: 11/29/2019 CLINICAL DATA:  Respiratory failure EXAM: PORTABLE CHEST 1 VIEW COMPARISON:  11/09/2019 FINDINGS: Overlying defibrillator pads. Endotracheal tube terminates approximately 3.2 cm above the carina. Enteric tube terminates within the stomach. Right IJ central venous catheter and left subclavian central venous catheter tips terminate within the SVC. Left subclavian catheter no longer appears kinked. Stable heart size. Low lung volumes. Improving aeration within the lung bases. No pneumothorax. IMPRESSION: 1. Left subclavian catheter no longer appears kinked. Otherwise, stable lines and tubes. 2. Low lung volumes.  Improving aeration within the lung bases. Electronically Signed   By: Davina Poke D.O.   On: 11/24/2019 09:43   DG CHEST PORT 1 VIEW  Result Date: 11/09/2019 CLINICAL DATA:   Central line placement EXAM: PORTABLE CHEST 1 VIEW COMPARISON:  Nov 09, 2019 FINDINGS: The endotracheal tube terminates above the carina. There is a new left-sided central venous catheter with tip terminating over the left brachiocephalic vein. The catheter appears kinked in the midportion. There is a right-sided central venous catheter that is stable from prior study. The enteric tube terminates over the gastric body. There is a persistent opacity at the right lung base consistent with a right-sided pleural effusion with associated airspace disease. There is a small left-sided pleural effusion. The cardiac silhouette is unchanged. IMPRESSION: 1. New left subclavian approach central venous catheter. This catheter appears kinked in the midportion. There is no left-sided pneumothorax. 2. Remaining lines and tubes as above. 3. Persistent bilateral pleural effusions, right greater than left, with adjacent airspace disease. Electronically Signed   By: Constance Holster M.D.   On: 11/09/2019 17:16   DG CHEST PORT 1 VIEW  Result Date: 11/09/2019 CLINICAL DATA:  Intubation EXAM: PORTABLE CHEST 1 VIEW COMPARISON:  Portable exam 1407 hours compared to 11/09/2019 FINDINGS: External pacing leads project over chest. Tip of endotracheal tube projects 3.9 cm above carina. Nasogastric tube extends into abdomen. Normal heart size, mediastinal contours, and pulmonary vascularity. RIGHT pleural effusion and basilar atelectasis. Mild LEFT basilar atelectasis. Upper lungs clear. No pneumothorax or acute osseous findings. IMPRESSION: RIGHT pleural effusion and bibasilar atelectasis RIGHT greater than LEFT. Electronically Signed   By: Lavonia Dana M.D.   On: 11/09/2019 14:24   DG Chest Port 1 View  Result Date: 11/09/2019 CLINICAL DATA:  Status post aortic aneurysm repair. EXAM: PORTABLE CHEST 1 VIEW COMPARISON:  Nov 08, 2019. FINDINGS: Stable cardiomediastinal silhouette. Nasogastric tube is unchanged in position. Right internal  jugular sheath is stable. Hypoinflation of the lungs is noted with minimal bibasilar subsegmental atelectasis. No pneumothorax or pleural effusion is noted. Bony thorax is unremarkable. IMPRESSION: Hypoinflation of the lungs with minimal bibasilar  subsegmental atelectasis. Electronically Signed   By: Marijo Conception M.D.   On: 11/09/2019 08:54   DG CHEST PORT 1 VIEW  Result Date: 11/09/2019 CLINICAL DATA:  Abnormal respiration. EXAM: PORTABLE CHEST 1 VIEW COMPARISON:  11/24/2019. FINDINGS: NG tube and right IJ sheath in stable position. Stable cardiomegaly. No pulmonary venous congestion. Low lung volumes with bibasilar atelectasis again noted. Tiny left pleural effusion cannot be excluded. No pneumothorax. Degenerative change thoracic spine. IMPRESSION: 1.  NG tube right IJ sheath in stable position. 2.  Stable cardiomegaly. 3. Low lung volumes with bibasilar atelectasis again noted. Tiny left pleural effusion cannot be excluded. Electronically Signed   By: Marcello Moores  Register   On: 11/09/2019 08:44   DG Chest Port 1 View  Result Date: 11/18/2019 CLINICAL DATA:  Postop repair of abdominal aortic aneurysm. EXAM: PORTABLE CHEST 1 VIEW COMPARISON:  Radiograph 12/21/2004 FINDINGS: 1354 hours. Right IJ sheath projects to the level of the upper SVC. Enteric tube projects to the level of the mid stomach. There are low lung volumes with bibasilar atelectasis. No confluent airspace opacity, pneumothorax or significant pleural effusion. The heart size and mediastinal contours are stable for the degree of inspiration. No acute osseous findings are seen. Surgical clips are seen in the upper abdomen. IMPRESSION: Low lung volumes with bibasilar atelectasis. No pneumothorax or other acute process identified. Electronically Signed   By: Richardean Sale M.D.   On: 11/07/2019 14:06   DG Abd Portable 1V  Result Date: 11/26/2019 CLINICAL DATA:  Status post AAA repair EXAM: PORTABLE ABDOMEN - 1 VIEW COMPARISON:  None. FINDINGS:  There is no bowel dilatation to suggest obstruction. There is no evidence of pneumoperitoneum, portal venous gas or pneumatosis. There are no pathologic calcifications along the expected course of the ureters. There is a right common iliac and external iliac stent graft noted. The osseous structures are unremarkable. IMPRESSION: No acute abdominal abnormality. Electronically Signed   By: Kathreen Devoid   On: 11/13/2019 14:06   CT Angio Chest/Abd/Pel for Dissection W and/or W/WO  Result Date: 11/09/2019 : Alerts CLINICAL DATA:  Cardiac arrest. Recent aortobifem bypass. EXAM: CT ANGIOGRAPHY CHEST, ABDOMEN AND PELVIS TECHNIQUE: Multidetector CT imaging through the chest, abdomen and pelvis was performed using the standard protocol during bolus administration of intravenous contrast. Multiplanar reconstructed images and MIPs were obtained and reviewed to evaluate the vascular anatomy. CONTRAST:  156mL OMNIPAQUE IOHEXOL 350 MG/ML SOLN COMPARISON:  09/16/2019 FINDINGS: CTA CHEST FINDINGS Cardiovascular: Right IJ central venous catheter extends to the mid SVC. Heart size normal. Fair contrast opacification of pulmonary arterial branches. No convincing filling defects to suggest PE. Mild calcifications in the LAD. Extensive atheromatous plaque through the thoracic aorta. Peripherally calcified ductus diverticulum. Eccentric nonocclusive mural thrombus throughout the descending segment. No aneurysm, dissection or stenosis. Classic 3 vessel brachiocephalic arterial origin anatomy without proximal stenosis. Mediastinum/Nodes: No hilar or mediastinal adenopathy. Endotracheal tube in good position. Lungs/Pleura: Small left pleural effusion and trace right effusion. No pneumothorax. Consolidation/atelectasis of the right middle and lower lobes with probable occlusive central bronchial secretions. Dependent atelectasis in the posterior right upper lobe and posterior left lower lobe. Musculoskeletal: Anterior vertebral endplate  spurring at multiple levels in the mid and lower thoracic spine. No fracture or worrisome bone lesion. Review of the MIP images confirms the above findings. CTA ABDOMEN AND PELVIS FINDINGS VASCULAR Aorta: Eccentric nonocclusive mural thrombus in the native suprarenal and juxtarenal segments. Interval infrarenal aortobifem graft repair, patent. Coarse calcifications in the collapsed native  aneurysm sac. Celiac: Patent without evidence of aneurysm, dissection, vasculitis or significant stenosis. SMA: Mild nonocclusive plaque. Classic distal branch anatomy. Renals: Duplicated left, inferior dominant with short-segment ostial stenosis. Single right, with partially calcified proximal plaque, no high-grade stenosis. IMA: Not opacified. Inflow: Both limbs of the aortobifem graft widely patent through the distal anastomoses. Interval long segment occlusion of right common iliac artery extending into internal and external iliac arteries, with distal reconstitution. Left common iliac is now occluded through its length, with collateral reconstitution of atheromatous nondilated external and internal iliac arteries. Patent but atheromatous proximal visualized lower extremity arterial outflow. Veins: No obvious venous abnormality within the limitations of this arterial phase study. Review of the MIP images confirms the above findings. NON-VASCULAR Hepatobiliary: Heterogenous parenchymal enhancement presumably related to early arterial phase timing of the scan, with limited portal vein enhancement. Gallbladder unremarkable. Pancreas: Unremarkable. No pancreatic ductal dilatation or surrounding inflammatory changes. Spleen: Heterogenous enhancement presumably related to early scan timing. Adrenals/Urinary Tract: Bilateral adrenal hypertrophy. No renal mass or hydronephrosis. Foley catheter decompresses urinary bladder. Stomach/Bowel: Orogastric tube extends into the stomach which is partially distended by gas and fluid. Fluid  distention of the duodenum. Small bowel is decompressed. Proximal colon is nondistended with a few scattered diverticula. Sigmoid colon is mildly distended proximally. Lymphatic: No definite adenopathy. Reproductive: Unremarkable Other: Small amount of pelvic and abdominal ascites. Left retroperitoneal poorly marginated fluid. A few bubbles of intraperitoneal gas presumably postop. Musculoskeletal: Interval midline anterior abdominal wall incision. Advanced degenerative disc disease L5-S1. No fracture or worrisome bone lesion. Review of the MIP images confirms the above findings. IMPRESSION: 1. Negative for acute PE or thoracic aortic dissection. 2. Interval infrarenal aortobifem graft repair, patent. 3. 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 4. Small amount of pelvic and abdominal ascites. 5. Small left and trace right pleural effusions. 6. Right middle and lower lobe consolidation/atelectasis with probable occlusive central bronchial secretions. 7. Coronary artery disease. Aortic Atherosclerosis (ICD10-I70.0). Electronically Signed   By: Lucrezia Europe M.D.   On: 11/09/2019 16:03   . sodium chloride   Intravenous Once  . chlorhexidine  15 mL Mouth Rinse BID  . Chlorhexidine Gluconate Cloth  6 each Topical Q0600  . dextrose      . docusate  100 mg Oral BID  . docusate  100 mg Oral BID  . heparin  5,000 Units Subcutaneous Q8H  . insulin aspart  0-15 Units Subcutaneous TID WC  . mouth rinse  15 mL Mouth Rinse q12n4p  . mouth rinse  15 mL Mouth Rinse Q2H  . pantoprazole (PROTONIX) IV  40 mg Intravenous QHS  . polyethylene glycol  17 g Oral Daily  . polyethylene glycol  17 g Oral Daily  . sodium bicarbonate  50 mEq Intravenous Once  . sodium chloride flush  10-40 mL Intracatheter Q12H    BMET    Component Value Date/Time   NA 140 11/25/2019 1038   K 5.7 (H) 11/26/2019 1038   CL 103 11/07/2019 0531   CO2 20 (L) 12/01/2019 0531   GLUCOSE 85  11/25/2019 0531   BUN 33 (H) 11/07/2019 0531   CREATININE 5.08 (H) 11/07/2019 0531   CALCIUM 7.4 (L) 11/11/2019 0531   GFRNONAA 11 (L) 11/07/2019 0531   GFRAA 13 (L) 12/01/2019 0531   CBC    Component Value Date/Time   WBC 6.3 11/19/2019 0531   RBC 3.49 (L) 11/18/2019 0531   HGB 7.8 (L) 11/22/2019 1038  HCT 23.0 (L) 11/07/2019 1038   PLT 89 (L) 11/15/2019 0531   MCV 85.1 11/13/2019 0531   MCH 27.5 12/02/2019 0531   MCHC 32.3 11/11/2019 0531   RDW 18.0 (H) 11/15/2019 0531   LYMPHSABS 0.8 11/15/2019 0531   MONOABS 0.3 11/06/2019 0531   EOSABS 0.0 11/19/2019 0531   BASOSABS 0.0 11/20/2019 0531    Assessment/Plan:  1. Shock with multisystem organ failure following AAA repair- due to ischemic/dead bowel s/p emergent exp lap and left colectomy this am. 2. AAA- s/p repair 11/29/2019  3. ESRD- currently on CRRT due to the development of shock with MOS failure.  Currently on all 0K/2.5Ca due to refractory hyperkalemia.  Will likely need to change to added K baths later since he has successfully undergone left colectomy and right leg thrombectomy.  Will keep even for now with CRRT.   4. Hyperkalemia- changed to 0K bath but likely due to ischemic bowel.  Will recheck labs as his potassium will likely be markedly improved after surgery. 5. VDRF/SOB- intubated 11/09/19 after cardiac arrest.  His earlier SOB was likely due to ischemic bowel and the development of lactic acidosis.  PCCM following now.  6. Anemia- s/p 3 units of PRBC's yesterday.  Follow H/H 7. HTN- meds on hold due to shock.  Continue to follow. 8. CKD MBD- low phos, hold binders  Donetta Potts, MD [employer] [phone]

## 2019-11-10 NOTE — Progress Notes (Signed)
  Day of Surgery Note    Subjective: Intubated.  Grimacing. Attendant RN and 2 sons at bedside   Vitals:   11/18/2019 1642 12/04/2019 1645  BP:    Pulse:  81  Resp: (!) 21 (!) 21  Temp: 99.1 F (37.3 C) 99.1 F (37.3 C)  SpO2:  99%   Extremities: Both feet are warm.  I am able to obtain Doppler signals of both dorsalis pedis pulses.  The right calf is tense and there is a small bullous lesion along the medial incision.  Dressings with small amount of shadow staining intact. Cardiac: None rhythm regular Lungs: Clinically ventilated Abdomen: ABThera dressing in place   Assessment/Plan:  This is a 69 y.o. male who is s/p right lower extremity thrombectomy with bovine cardial patch plasty of the right common femoral artery and right lower extremity 4 compartment fasciotomies this morning.  Fasciotomy incisions are closed with staples.  This is my first examination of the patient today in his right calf is tense.  Discussed with Dr. Carlis Abbott who will see the patient and possibly remove some skin staples from right calf.  Sons updated    Risa Grill, PA-C 11/17/2019 4:54 PM [phone]

## 2019-11-10 NOTE — Progress Notes (Signed)
PULMONARY / CRITICAL CARE MEDICINE   Name: Juan Bass MRN: [id number] DOB: [birth year]    ADMISSION DATE:  11/23/2019 CONSULTATION DATE: 11/19/2019  REFERRING MD:  Dr. Monica Martinez   CHIEF COMPLAINT:  Post-Op hyperkalemia, hypoxia. resp distres  HISTORY OF PRESENT ILLNESS:   69 year old man with end-stage renal disease on hemodialysis who underwent an uneventful aorto bifemoral bypass 5/3. Hypotensive following surgery. Increasing hypoxia and respiratory distress post op day 1 followed by brief cardiac arrest. Required intubation and initiation of vasopressors after shock.  CT chest r/o PE and aortic dissection. Revealed left colon and large bowel dilation. Upon re-evaluation the patient was unable to re-estab flow to right leg. Dopplers were negative for pulse and patient was rush for emergency left colectomy and fasciotomy times 4    PAST MEDICAL HISTORY :  He  has a past medical history of AAA (abdominal aortic aneurysm) ([place]), Chronic kidney disease, Diabetes mellitus without complication ([place]), Hypertension, Hypothyroidism, and PAD (peripheral artery disease) ([place]).  PAST SURGICAL HISTORY: He  has a past surgical history that includes Back surgery; abdominal aortagram (N/A, 02/22/2014); Spine surgery; AV fistula placement (Left, 08/06/2018); A/V Fistulagram (Left, 10/14/2018); PERIPHERAL VASCULAR BALLOON ANGIOPLASTY (Left, 10/14/2018); and Abdominal aortic aneurysm repair (N/A, 11/13/2019).  No Known Allergies  No current facility-administered medications on file prior to encounter.   Current Outpatient Medications on File Prior to Encounter  Medication Sig  . aspirin EC 81 MG tablet Take 1 tablet (81 mg total) by mouth daily with breakfast.  . Cholecalciferol (VITAMIN D3) 50 MCG (2000 UT) TABS Take 2,000 Units by mouth 4 (four) times a week.  . labetalol (NORMODYNE) 200 MG tablet Take 2 tablets (400 mg total) by mouth 2 (two) times daily. (Patient taking differently: Take 200 mg  by mouth 2 (two) times daily. )  . levothyroxine (SYNTHROID) 50 MCG tablet Take 1 tablet (50 mcg total) by mouth daily before breakfast.  . acetaminophen (TYLENOL) 325 MG tablet Take 2 tablets (650 mg total) by mouth every 6 (six) hours as needed for mild pain (or Fever >/= 101).   SUBJECTIVE  Decreasing pressor requirements. More comfortable on ventilator since initiated fluid removal by CRRT, also significant fluid loss via wound VAC.   VITAL SIGNS: BP (!) 108/47   Pulse 73   Temp (!) 96.6 F (35.9 C)   Resp 18   Ht 5\' 9"  (1.753 m)   Wt 81.9 kg   SpO2 97%   BMI 26.66 kg/m   HEMODYNAMICS: CVP:  [5 mmHg-16 mmHg] 5 mmHg  VENTILATOR SETTINGS: Vent Mode: PRVC FiO2 (%):  [90 %-100 %] 100 % Set Rate:  [15 bmp-18 bmp] 18 bmp Vt Set:  [560 mL] 560 mL PEEP:  [5 cmH20-8 cmH20] 8 cmH20 Plateau Pressure:  [20 RAQ76-22 cmH20] 20 cmH20  INTAKE / OUTPUT: I/O last 3 completed shifts: In: 4204.1 [I.V.:3443.9; NG/GT:10; IV Piggyback:750.3] Out: 2131 [Urine:175; Emesis/NG output:50; Other:1906]  PHYSICAL EXAMINATION: Physical Exam Constitutional:      Appearance: Normal appearance.     Interventions: He is sedated and intubated.  HENT:     Head:     Comments: OGT and ETT in place.  Cardiovascular:     Rate and Rhythm: Tachycardia present.     Pulses:          Posterior tibial pulses are detected w/ Doppler on the left side. Right posterior tibial pulse not accessible.     Heart sounds: Normal heart sounds.     Comments: c Pulmonary:  Effort: He is intubated.     Breath sounds: Normal breath sounds.     Comments: Compliant with mechanical ventilation.  Abdominal:     General: Bowel sounds are absent.     Palpations: Abdomen is soft.     Comments: L paramedian VAC dressing.   Musculoskeletal:     Right lower leg: 2+ Edema present.     Left lower leg: 2+ Edema present.  Skin:    Comments: pupurae right leg and left toes.  Neurological:     Mental Status: He is  unresponsive.     LABS:  BMET Recent Labs  Lab 12/02/2019 0008 11/11/2019 0008 11/24/2019 0305 11/24/2019 0531 11/21/2019 0541  NA 138   < > 141 139 138  K >7.5*   < > 6.7* 6.2* 6.0*  CL 105  --  104 103  --   CO2 17*  --  20* 20*  --   BUN 33*  --  33* 33*  --   CREATININE 5.38*  --  5.28* 5.08*  --   GLUCOSE 89  --  66* 85  --    < > = values in this interval not displayed.    Electrolytes Recent Labs  Lab 11/22/2019 1334 11/07/2019 1334 11/09/19 0145 11/09/19 0949 11/09/19 2302 11/09/19 2302 11/26/2019 0008 11/18/2019 0305 12/04/2019 0531  CALCIUM 8.1*   < > 7.8*   < > 7.2*   < > 7.3* 8.0* 7.4*  MG 1.5*  --  1.5*  --   --   --   --   --  2.4  PHOS 6.0*   < > 2.9  --  8.4*  --   --  8.6* 7.6*   < > = values in this interval not displayed.    CBC Recent Labs  Lab 11/09/19 0949 11/09/19 1039 11/09/19 1421 11/09/19 1721 11/09/19 2208 12/02/2019 0531 12/03/2019 0541  WBC 11.5*  --  11.8*  --   --  6.3  --   HGB 11.5*   < > 11.5*   < > 8.2* 9.6* 9.5*  HCT 34.5*   < > 34.7*   < > 24.0* 29.7* 28.0*  PLT 107*  --  103*  --   --  89*  --    < > = values in this interval not displayed.    Coag's Recent Labs  Lab 11/04/19 1000 11/27/2019 1334 11/11/2019 0531  APTT 40* 46*  --   INR 1.0 1.3* 2.1*    Sepsis Markers Recent Labs  Lab 11/09/19 2302 11/18/2019 0305 11/11/2019 0531  LATICACIDVEN 8.2* 8.1* 6.2*    ABG Recent Labs  Lab 11/09/19 1721 11/09/19 2208 11/25/2019 0541  PHART 7.392 7.352 7.379  PCO2ART 35.6 35.4 39.6  PO2ART 83 62* 76*    Liver Enzymes Recent Labs  Lab 11/04/19 1000 11/04/19 1000 11/09/19 0145 11/09/19 0145 11/09/19 2302 12/02/2019 0305 11/13/2019 0531  AST 24  --  303*  --   --   --  1,810*  ALT 29  --  259*  --   --   --  1,016*  ALKPHOS 62  --  38  --   --   --  52  BILITOT 0.9  --  1.7*  --   --   --  2.2*  ALBUMIN 3.9   < > 2.8*   < > 2.2* 3.0* 3.1*   < > = values in this interval not displayed.    Cardiac Enzymes No results  for  input(s): TROPONINI, PROBNP in the last 168 hours.  Glucose Recent Labs  Lab 11/09/19 1547 11/09/19 2149 11/09/19 2208 11/09/19 2355 11/25/2019 0006 11/11/2019 0756  GLUCAP 85 47* 250* 33* 88 98    Imaging DG CHEST PORT 1 VIEW  Result Date: 11/11/2019 CLINICAL DATA:  Intubation. EXAM: PORTABLE CHEST 1 VIEW COMPARISON:  11/13/2019. FINDINGS: Endotracheal tube, NG tube, right IJ sheath, left subclavian line in stable position. Heart size stable. Bibasilar infiltrates/edema noted on today's exam. Bibasilar atelectasis. Small right pleural effusion. No pneumothorax. Degenerative change thoracic spine. IMPRESSION: 1. Bibasilar infiltrates/edema noted on today's exam. Bibasilar atelectasis. Small right pleural effusion. 2.  Lines and tubes in stable position. Electronically Signed   By: Marcello Moores  Register   On: 11/11/2019 08:10   ANTIBIOTICS: Anti-infectives (From admission, onward)   Start     Dose/Rate Route Frequency Ordered Stop   11/09/19 1900  cefTRIAXone (ROCEPHIN) 2 g in sodium chloride 0.9 % 100 mL IVPB     2 g 200 mL/hr over 30 Minutes Intravenous Every 24 hours 11/09/19 1725     11/09/19 1900  metroNIDAZOLE (FLAGYL) IVPB 500 mg     500 mg 100 mL/hr over 60 Minutes Intravenous Every 8 hours 11/09/19 1725     11/09/19 0800  ceFAZolin (ANCEF) IVPB 2g/100 mL premix     2 g 200 mL/hr over 30 Minutes Intravenous Every 8 hours 11/22/2019 1545 11/09/19 1559   11/11/2019 0625  ceFAZolin (ANCEF) IVPB 2g/100 mL premix     2 g 200 mL/hr over 30 Minutes Intravenous 30 min pre-op 12/04/2019 8469 11/13/2019 0840      LINES/TUBES: Line Post Cath / Sheath Left Arterial -- days  Fistula / Graft Left Upper arm Arteriovenous fistula 462 days  Hemodialysis Catheter Left Internal jugular Triple lumen Temporary (Non-Tunneled) 1 day   CVC Line CVC Double Lumen 11/25/2019 Right Internal jugular 2 days  PIV Line Peripheral IV 11/13/2019 Right Hand 3 days  Peripheral IV 11/09/19 Anterior;Distal;Right;Upper Arm  1 day  ART Line Arterial Line 11/20/2019 Right Radial 3 days  Introducer Single Lumen 12/01/2019 Internal Jugular Right 2 days   Drain NG/OG Tube Nasogastric 16 Fr. Left nare Documented cm marking at nare/ corner of mouth 3 days  Negative Pressure Wound Therapy Abdomen Mid 1 day    DISCUSSION: Postop day 3 status post open repair of juxtarenal abdominal aortic aneurysm. Patient had done very poorly postoperatively and intubated on pressors with multisystem organ failure.  Remains in multisystem organ failure intubated in ICU on CRRT.  Noted to have ischemic right leg last night with no signals.  Right leg became increasingly mottled. He was emergently taken to OR for lap and revealed ischemic left colon requiring left colectomy with abdominal wound vac in place.  ASSESSMENT / PLAN:  Critically ill due to Hypoxemic hypercapnic res failure requiring mechanical ventilation.  Weaning FiO2. - Continue full support at least until abdominal closure.   Critically ill due to Distributive Shock requiring titration of vasopressors due to ischemic colitis. Improving pressor requirement.s - Continue wean vasopressors to keep MAP >65  Critically ill due Acute on chronic renal insufficiency-ESRD-  improving hyperkalemia - Continue CRRT  - Minimal fluid removal given losses via VAC  Ischemic Colitis S/p laparotomy 2/2 distal colonic ischemic necrosis - Continue antibiotics  - For closure on 5/7.  Cholesterol embolization R leg and left foot. Status post recent aortobifemoral bypass Status post thrombectomy for revascularization of right leg- Improving perfusion.  - continue to  monitor pulses - fasciotomy care per Vascular surgery.  Anemia- -s/p 3 units of PRBC's yesterday.  -CTM/ CBC  T2DM  Insulin aspart SSI  FAMILY  - Updates: both son and grandson present most of day and have been kept informed by staff.  CRITICAL CARE Performed by: Kipp Brood   Total critical care time: 45  minutes  Critical care time was exclusive of separately billable procedures and treating other patients.  Critical care was necessary to treat or prevent imminent or life-threatening deterioration.  Critical care was time spent personally by me on the following activities: development of treatment plan with patient and/or surrogate as well as nursing, discussions with consultants, evaluation of patient's response to treatment, examination of patient, obtaining history from patient or surrogate, ordering and performing treatments and interventions, ordering and review of laboratory studies, ordering and review of radiographic studies, pulse oximetry, re-evaluation of patient's condition and participation in multidisciplinary rounds.  Kipp Brood, MD [facility] ICU Physician [place]  Pager: [phone] Mobile: [phone] After hours: [phone].   11/13/2019, 8:05 AM

## 2019-11-10 NOTE — Progress Notes (Signed)
OT Cancellation Note  Patient Details Name: Juan Bass MRN: [id number] DOB: 1951/04/30   Cancelled Treatment:    Reason Eval/Treat Not Completed: Patient not medically ready;Patient at procedure or test/ unavailable   OT to continue to follow next available treatment day.  Jefferey Pica, OTR/L Acute Rehabilitation Services Pager: [phone] Office: [phone]   Jefferey Pica 12/04/2019, 9:44 AM

## 2019-11-10 NOTE — Progress Notes (Signed)
Initial Nutrition Assessment  DOCUMENTATION CODES:   Not applicable  INTERVENTION:    TPN to start tomorrow  Monitor for ability to initiate enteral nutrition   NUTRITION DIAGNOSIS:   Increased nutrient needs related to post-op healing as evidenced by estimated needs.  GOAL:   Patient will meet greater than or equal to 90% of their needs  MONITOR:   Diet advancement, Vent status, Skin, Labs, I & O's, Weight trends  REASON FOR ASSESSMENT:   Consult New TPN/TNA  ASSESSMENT:   Patient with PMH significant for DM, HTN, PAD, and ESRD on HD. Presents this admission with abdominal aortic aneurysm.   5/3- s/p open juxtarenal abdominal aortic aneurysm repair with aortobifemoral bypass 5/4- brief cardiac arrest, intubated  5/5- s/p L colectomy left in discontinuity, VAC placement, R lower extremity thrombectomy, R lower extremity 4 compartment fasciotomies  Pt discussed during ICU rounds and with RN.   Requiring pressors. On CRRT being kept even. Suspect improvement in electrolytes with CRRT. Unsure of exact abdominal closure date. Plan to start TPN tomorrow.    Unsure of EDW. Utilize 75 kg ad EDW for now.  No family at beside to obtain nutrition and weight history.   Patient is currently intubated on ventilator support MV: 9.7 L/min Temp (24hrs), Avg:96.6 F (35.9 C), Min:92.3 F (33.5 C), Max:98.2 F (36.8 C)   I/O: +6,991 ml since admit  UOP: 30 ml x 24 hrs  CRRT: 1,848 ml x 24 hrs   Drips: D5 in NS @ 75 ml/hr, vasopressin, levophed  Medications: colace, SS novolog, miralax Labs: K 6.0 (H) Cr 5.08-down from this am LFTs elevated Phosphorus 7.6 (H)   Diet Order:   Diet Order            Diet NPO time specified  Diet effective now              EDUCATION NEEDS:   Not appropriate for education at this time  Skin:  Skin Assessment: Skin Integrity Issues: Skin Integrity Issues:: Incisions, Wound VAC Wound Vac: abdomen Incisions: bilateral groin, R  leg  Last BM:  5/5  Height:   Ht Readings from Last 1 Encounters:  11/11/2019 5\' 9"  (1.753 m)    Weight:   Wt Readings from Last 1 Encounters:  12/03/2019 81.9 kg    BMI:  Body mass index is 26.66 kg/m.  Estimated Nutritional Needs:   Kcal:  1875-2250 kcal  Protein:  130-150 grams  Fluid:  1000 ml + UOP (liberalized on CRRT)   Mariana Single RD, LDN Clinical Nutrition Pager listed in [place]

## 2019-11-10 NOTE — Progress Notes (Signed)
VASCULAR SURGERY:  Called this morning because the right leg has become significantly more mottled.  On exam he has splotchy areas of mottling in the thigh and lower leg.  The calf is actually warm although the foot is cooler.  He continues to have palpable femoral pulses and Doppler signals in the left foot.  He received volume last night and his Levophed has been weaned down from 37 to 87.  He is also on vasopressin.  Given his acidosis although ischemic bowel is in the differential diagnosis, he could have also embolized to the right leg.  I am not sure what is preoperative status was but if he threw significant "trash" to the leg he may have knocked off some important collaterals.  Will discuss with Dr. Carlis Abbott.  Deitra Mayo, MD Office: [phone]

## 2019-11-10 NOTE — Anesthesia Postprocedure Evaluation (Signed)
Anesthesia Post Note  Patient: Juan Bass  Procedure(s) Performed: THROMBECTOMY OF BYPASS GRAFT OF LEG (Right Groin) EXPLORATORY LAPAROTOMY (N/A Abdomen) Patch Angioplasty (Right Groin) Colon Resection Sigmoid (N/A Abdomen) Application Of Wound Vac (N/A Abdomen) Fasciotomy (Right Leg Lower)     Patient location during evaluation: SICU Anesthesia Type: General Level of consciousness: sedated Pain management: pain level controlled Vital Signs Assessment: post-procedure vital signs reviewed and stable Respiratory status: patient remains intubated per anesthesia plan Cardiovascular status: stable Postop Assessment: no apparent nausea or vomiting Anesthetic complications: no    Last Vitals:  Vitals:   11/17/2019 0800 11/15/2019 1215  BP: 111/63 137/66  Pulse: 74 82  Resp: 19 18  Temp: (!) 35.9 C   SpO2: 99% 96%    Last Pain:  Vitals:   11/09/19 1215  TempSrc: Axillary  PainSc:                  Juan Bass

## 2019-11-10 NOTE — Plan of Care (Signed)
  Problem: Elimination: Goal: Will not experience complications related to bowel motility Outcome: Not Progressing   Problem: Elimination: Goal: Will not experience complications related to urinary retention Outcome: Not Progressing   Problem: Pain Managment: Goal: General experience of comfort will improve Outcome: Progressing  On fentanyl gtt Problem: Activity: Goal: Ability to tolerate increased activity will improve Outcome: Not Progressing   Problem: Respiratory: Goal: Ability to maintain a clear airway and adequate ventilation will improve Outcome: Progressing  vented

## 2019-11-10 NOTE — Progress Notes (Signed)
PHARMACY - TOTAL PARENTERAL NUTRITION CONSULT NOTE  Indication:  Bowel ischemia  Patient Measurements: Height: 5\' 9"  (175.3 cm) Weight: 81.9 kg (180 lb 8.9 oz) IBW/kg (Calculated) : 70.7 TPN AdjBW (KG): 75 Body mass index is 26.66 kg/m.  Assessment:   19 YOM presented on 11/11/2019 for AAA repair with aortobifemoral bypass.  Post-op course complicated by increased WOB, hypotension and cardiac arrest.  He was taken back to the OR on 11/28/2019 for left colectomy (left in discontinuity) and VAC placement for ischemic left colon, and reopening of recent laparotomy, RLE thrombectomy and 4 compartment fasciotomies.  Pharmacy consulted to manage TPN as no oral/enteral nutrition anticipated within the next several days.  Patient is intubated and sedated - unsure of nutritional status PTA.  Glucose / Insulin: hx DM on glipizide PTA - CBGs low normal Electrolytes: K 5.7 (0K bath through CRRT), low iCa, Phos 7.6,  Renal: ESRD, last HD session 5/3.  CRRT started 5/4.  SCr 5.08 LFTs / TGs: AST/ALT 1810/1016, tbili 2.2 Prealbumin / albumin: albumin 3.1, amylase 258 Intake / Output; MIVF: D5NS at 75 ml/hr GI Imaging: none since TPN consult Surgeries / Procedures: none since TPN consult  Central access: CVC double lumen placed 11/13/2019 TPN start date: 11/11/19  Nutritional Goals (RD rec pending):  Current Nutrition:  NPO  Plan:  Start concentrated TPN without lipids on 11/11/19 as TPN consult received after hours Change moderate SSI to Q6H Standard TPN labs and nursing care orders  Waynetta Metheny D. Mina Marble, PharmD, BCPS, [place] 11/28/2019, 1:14 PM

## 2019-11-10 NOTE — Progress Notes (Signed)
CRITICAL VALUE ALERT  Critical Value:  K = 7.5  Date & Time Notied:  5/4 2350  Provider Notified: Danie Chandler  Orders Received/Actions taken: notified of abg results also. Orders to repeat and verify labs, possible change in CRRT orders.

## 2019-11-10 NOTE — Progress Notes (Signed)
PT Cancellation Note  Patient Details Name: Juan Bass MRN: [id number] DOB: Jul 14, 1950   Cancelled Treatment:    Reason Eval/Treat Not Completed: Patient not medically ready;Patient at procedure or test/unavailable   Shary Decamp [facility] 12/02/2019, 8:47 AM [place] Pager [phone] Office [phone]

## 2019-11-10 NOTE — Op Note (Signed)
Date: Nov 10, 2019  Preoperative diagnosis:  1. Shock and multisystem organ failure following open juxtarenal abdominal aortic aneurysm repair 2.  Ischemic right leg  Postoperative diagnosis 1.  Ischemic left colon 2.  Ischemic right leg  Procedure: 1.  Reopening of recent laparotomy 2.  Right lower extremity thrombectomy with bovine pericardial patch angioplasty of the right common femoral artery 3.  Right lower extremity 4 compartment fasciotomies  Surgeon: Dr. Marty Heck, MD  Assistant: Dr. Serita Grammes and Arlee Muslim, [place]  Indications: Patient is a 69 year old male with end-stage renal disease who underwent open juxtarenal abdominal aortic aneurysm repair on Monday.  He is now postop day 2 and has had a very difficult postoperative course with initial hypoxia and respiratory issues and then ongoing shock of unclear etiology.  Ultimately given worsening clinical condition overnight we elected to take him back for exploratory laparotomy to rule out ischemic colon and also for right lower extremity thrombectomy given he developed an ischemic leg last night.  I have discussed this with his family in detail.  Findings: 1.  After re-opening laparotomy patient had frankly ischemic left colon and sigmoid colon.  General surgery was consulted Intra-Op for colon resection.  Please see their operative note for detail.  Ultimately patient was left in discontinuity with vac dressing. 2.  Reopened his right groin incision and there was excellent pulsatile inflow from the graft.  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  I was able to get a catheter to pass the least 70 cm in the foot.  Given his ischemia time we elected to perform 4 compartment fasciotomies and the skin was closed over top.  The muscle looked marginal.   He did have a good posterior tibial signal at completion of the case in the right leg.  Anesthesia: General  Resuscitation: 2 units packed red blood cells, 2 units FFP, 1 unit platelets  Details: Patient was taken emergently to OR 16 after consent was obtained from the family.  He was placed on operative table in supine position was already intubated during transport to the operating room.  His abdomen was then prepped and draped in usual sterile fashion.  We initially reopened his midline incision and ultimately opened the PDS sutures holding the fascia together.  Immediately became apparent that he had a frankly ischemic sigmoid and left colon.  This was obviously the source that was making him sick in the ICU.  General surgery was then consulted intraoperative.  Please see their dictation for colon resection.  During this procedure I assisted Dr. Donne Hazel.  I did use 3-0 Vicryl to close some of the retroperitoneum over the exposed bifurcated graft in the retroperitoneum.  Ultimately a VAC was placed and he was left in discontinuity.  Given this was dirty portion all of the drapes were removed and instruments were changed.  We then reprepped his groin and right leg and got new instruments.  We then cut down on his right groin incision and ultimately used cerebellum retractors for added visualization.  I could see the right limb of the graft with a good pulse and there was a good pulse in the common femoral artery.  We ultimately gave 100 units per kilogram heparin and got control the SFA as well as the profunda branches after that circulated for  2 minutes the limb of the graft was clamped with a Henley clamp and we reopened the hood of the Dacron graft onto the common femoral.  There was no apparent thrombus.  I then passed #3 #4 Fogarty's down the SFA and ultimately retrieved an initial arterial plug of thrombus and also multiple plugs of chronic appearing mural thrombus that was likely embolic from his  aorta.  There was some resistance passing down the proximal SFA so extended the arteriotomy with Potts scissors.  We did directly visualize the proximal SFA and it looked widely patent.  I made multiple passes until at least several clean passes.  I was able to get this to pass sevens and 70 cm in the foot.  I then passed #3 Fogarty down the profunda had good backbleeding. I then brought a bovine pericardial patch on the field and this was sewn onto the common femoral artery and anastomosed to the previous hood of the graft with a running 5-0 Prolene and we de-aired everything prior to completion.   There was excellent Doppler flow in the SFA and profunda.  Protamine was given for reversal.  Ultimately the groin was then closed with multiple layers of 2-0 Vicryl 3-0 Vicryl 4-0 Monocryl Dermabond. I then elected to perform fasciotomies given the length of ischemia time.  We made an incision 2 fingerbreadths lateral to the tibia and a longitudinal incision was made on the lateral calf.  I then identified the septum between anterior and lateral compartments and the fascia was opened with Metzenbaum scissors.  I then made another longitudinal incision about 2 fingerbreadths medial on the calf.  Again the fascia was opened trying to spare the saphenous vein that was apparent.  I then entered the deep compartment and the fascia was opened to release both the posterior superficial and deep compartments.  We had a good posterior tibial signal at this time.  The skin was then closed with staples.  To be taken back to the ICU in critical condition.  Complication: None  Condition: Stable  Marty Heck, MD [facility] [place] Office: [place]

## 2019-11-10 NOTE — Progress Notes (Signed)
Patient to OR via bed with CRNA after report given.  Family updated at bedside.

## 2019-11-10 NOTE — Anesthesia Preprocedure Evaluation (Signed)
Anesthesia Evaluation  Patient identified by MRN, date of birth, ID band Patient unresponsive    Reviewed: Allergy & Precautions, H&P , NPO status , Patient's Chart, lab work & pertinent test results  Airway Mallampati: Intubated       Dental no notable dental hx. (+) Teeth Intact, Dental Advisory Given   Pulmonary Current Smoker and Patient abstained from smoking.,    Pulmonary exam normal breath sounds clear to auscultation       Cardiovascular hypertension, + Peripheral Vascular Disease   Rhythm:Regular Rate:Normal     Neuro/Psych negative neurological ROS  negative psych ROS   GI/Hepatic negative GI ROS, Neg liver ROS,   Endo/Other  diabetesHypothyroidism   Renal/GU Renal disease  negative genitourinary   Musculoskeletal   Abdominal   Peds  Hematology negative hematology ROS (+)   Anesthesia Other Findings   Reproductive/Obstetrics negative OB ROS                             Anesthesia Physical Anesthesia Plan  ASA: IV  Anesthesia Plan: General   Post-op Pain Management:    Induction: Intravenous  PONV Risk Score and Plan: 1 and Midazolam  Airway Management Planned: Oral ETT  Additional Equipment:   Intra-op Plan:   Post-operative Plan: Post-operative intubation/ventilation  Informed Consent: I have reviewed the patients History and Physical, chart, labs and discussed the procedure including the risks, benefits and alternatives for the proposed anesthesia with the patient or authorized representative who has indicated his/her understanding and acceptance.     Dental advisory given  Plan Discussed with: CRNA  Anesthesia Plan Comments:         Anesthesia Quick Evaluation

## 2019-11-10 NOTE — Progress Notes (Signed)
Risa Grill, PA-C in to see patient and to check pulses.  Rt. Calf now taut compared to 2 hours ago.  Now with bullous blister noted that was not previously there.

## 2019-11-10 NOTE — Transfer of Care (Signed)
Immediate Anesthesia Transfer of Care Note  Patient: KAEDON FANELLI  Procedure(s) Performed: THROMBECTOMY OF BYPASS GRAFT OF LEG (Right Groin) EXPLORATORY LAPAROTOMY (N/A Abdomen) Patch Angioplasty (Right Groin) Colon Resection Sigmoid (N/A Abdomen) Application Of Wound Vac (N/A Abdomen) Fasciotomy (Right Leg Lower)  Patient Location: PACU  Anesthesia Type:General  Level of Consciousness: sedated and Patient remains intubated per anesthesia plan  Airway & Oxygen Therapy: Patient remains intubated per anesthesia plan and Patient placed on Ventilator (see vital sign flow sheet for setting)  Post-op Assessment: Report given to RN and Post -op Vital signs reviewed and stable  Post vital signs: Reviewed and stable  Last Vitals:  Vitals Value Taken Time  BP    Temp 36.5 C 11/11/2019 1217  Pulse 83 12/05/2019 1212  Resp 18 11/23/2019 1217  SpO2 96 % 11/09/2019 1212  Vitals shown include unvalidated device data.  Last Pain:  Vitals:   11/09/19 1215  TempSrc: Axillary  PainSc:       Patients Stated Pain Goal: 2 (87/21/58 [id number])  Complications: No apparent anesthesia complications

## 2019-11-10 NOTE — Consult Note (Signed)
Responded to page, upon request prayed with pt's family and staff immediately before pt is to go to surgery. They were appreciative.  Rev. Eloise Levels Chaplain

## 2019-11-10 NOTE — Progress Notes (Signed)
PULMONARY / CRITICAL CARE MEDICINE   Name: Juan Bass MRN: [id number] DOB: [birth year]    ADMISSION DATE:  11/22/2019 CONSULTATION DATE:  11/13/2019  REFERRING MD:  Dr. Monica Martinez  CHIEF COMPLAINT:  Post Op hyperkalemia, hypoxia, resp distress  HISTORY OF PRESENT ILLNESS:   69 year old man with end-stage renal disease on hemodialysis who underwent an uneventful aorto bifemoral bypass 5/3. Hypotensive following surgery. Increasing hypoxia and respiratory distress today. Associated hypotension requiring initiation of vasopressors.  PAST MEDICAL HISTORY :  He  has a past medical history of AAA (abdominal aortic aneurysm) ([place]), Chronic kidney disease, Diabetes mellitus without complication ([place]), Hypertension, Hypothyroidism, and PAD (peripheral artery disease) ([place]).  PAST SURGICAL HISTORY: He  has a past surgical history that includes Back surgery; abdominal aortagram (N/A, 02/22/2014); Spine surgery; AV fistula placement (Left, 08/06/2018); A/V Fistulagram (Left, 10/14/2018); PERIPHERAL VASCULAR BALLOON ANGIOPLASTY (Left, 10/14/2018); and Abdominal aortic aneurysm repair (N/A, 12/02/2019).  No Known Allergies  No current facility-administered medications on file prior to encounter.   Current Outpatient Medications on File Prior to Encounter  Medication Sig  . aspirin EC 81 MG tablet Take 1 tablet (81 mg total) by mouth daily with breakfast.  . Cholecalciferol (VITAMIN D3) 50 MCG (2000 UT) TABS Take 2,000 Units by mouth 4 (four) times a week.  . labetalol (NORMODYNE) 200 MG tablet Take 2 tablets (400 mg total) by mouth 2 (two) times daily. (Patient taking differently: Take 200 mg by mouth 2 (two) times daily. )  . levothyroxine (SYNTHROID) 50 MCG tablet Take 1 tablet (50 mcg total) by mouth daily before breakfast.  . acetaminophen (TYLENOL) 325 MG tablet Take 2 tablets (650 mg total) by mouth every 6 (six) hours as needed for mild pain (or Fever >/= 101).    Current  Facility-Administered Medications (Endocrine & Metabolic):  .  insulin aspart (novoLOG) injection 0-15 Units .  vasopressin (PITRESSIN) 40 Units in sodium chloride 0.9 % 250 mL (0.16 Units/mL) infusion   Current Facility-Administered Medications (Cardiovascular):  .  norepinephrine (LEVOPHED) 16 mg in 244mL premix infusion   Current Facility-Administered Medications (Respiratory):  .  guaiFENesin-dextromethorphan (ROBITUSSIN DM) 100-10 MG/5ML syrup 15 mL   Current Facility-Administered Medications (Analgesics):  .  acetaminophen (TYLENOL) tablet 325-650 mg ORacetaminophen (TYLENOL) suppository 325-650 mg  .  fentaNYL (SUBLIMAZE) bolus via infusion 25 mcg .  fentaNYL (SUBLIMAZE) injection 25 mcg .  fentaNYL (SUBLIMAZE) injection 25-100 mcg .  fentaNYL 2531mcg in NS 272mL (69mcg/ml) infusion-PREMIX .  lidocaine (PF) (XYLOCAINE) 1 % injection 5 mL   Current Facility-Administered Medications (Hematological):  .  alteplase (CATHFLO ACTIVASE) injection 2 mg .  heparin injection 1,000-6,000 Units .  heparin injection 5,000 Units .  heparinized saline (2000 units/L) primer fluid for CRRT   Current Facility-Administered Medications (Other):  .  0.9 %  sodium chloride infusion .  bisacodyl (DULCOLAX) suppository 10 mg .  cefTRIAXone (ROCEPHIN) 2 g in sodium chloride 0.9 % 100 mL IVPB .  chlorhexidine (PERIDEX) 0.12 % solution 15 mL .  Chlorhexidine Gluconate Cloth 2 % PADS 6 each .  dextrose 5 %-0.9 % sodium chloride infusion .  dextrose 50 % solution .  docusate (COLACE) 50 MG/5ML liquid 100 mg .  lidocaine-prilocaine (EMLA) cream 1 application .  MEDLINE mouth rinse .  metroNIDAZOLE (FLAGYL) IVPB 500 mg .  ondansetron (ZOFRAN) injection 4 mg .  pantoprazole (PROTONIX) injection 40 mg .  pentafluoroprop-tetrafluoroeth (GEBAUERS) aerosol 1 application .  phenol (CHLORASEPTIC) mouth spray 1 spray .  polyethylene  glycol (MIRALAX / GLYCOLAX) packet 17 g .  prismasol BGK 0/2.5  infusion .  sodium bicarbonate injection 50 mEq    No current outpatient medications on file. FAMILY HISTORY:  His He indicated that his mother is deceased. He indicated that his father is deceased. He indicated that his sister is alive. He indicated that his brother is alive.   SOCIAL HISTORY: He  reports that he has been smoking cigarettes. He has a 20.00 pack-year smoking history. He has never used smokeless tobacco. He reports current alcohol use of about 1.0 standard drinks of alcohol per week. He reports that he does not use drugs.  REVIEW OF SYSTEMS:   Full Review of Systems negative except otherwise specified as above   VITAL SIGNS: BP 137/66   Pulse 82   Temp (!) 96.6 F (35.9 C)   Resp 18   Ht 5\' 9"  (1.753 m)   Wt 81.9 kg   SpO2 96%   BMI 26.66 kg/m   HEMODYNAMICS: CVP:  [5 mmHg-12 mmHg] 6 mmHg  VENTILATOR SETTINGS: Vent Mode: PRVC FiO2 (%):  [90 %-100 %] 100 % Set Rate:  [16 bmp-18 bmp] 18 bmp Vt Set:  [560 mL] 560 mL PEEP:  [5 cmH20-8 cmH20] 8 cmH20 Plateau Pressure:  [20 LFY10-17 cmH20] 24 cmH20  INTAKE / OUTPUT: I/O last 3 completed shifts: In: 4416.4 [I.V.:3656.2; NG/GT:10; IV Piggyback:750.3] Out: 2131 [Urine:175; Emesis/NG output:50; Other:1906]  PHYSICAL EXAMINATION: Physical Exam  LABS:  BMET Recent Labs  Lab 11/09/2019 0008 11/28/2019 0008 11/24/2019 0305 11/26/2019 0305 11/24/2019 0531 12/06/2019 0541 12/01/2019 0843 11/18/2019 0941 12/05/2019 1038  NA 138   < > 141   < > 139   < > 139 140 140  K >7.5*   < > 6.7*   < > 6.2*   < > 5.7* 5.8* 5.7*  CL 105  --  104  --  103  --   --   --   --   CO2 17*  --  20*  --  20*  --   --   --   --   BUN 33*  --  33*  --  33*  --   --   --   --   CREATININE 5.38*  --  5.28*  --  5.08*  --   --   --   --   GLUCOSE 89  --  66*  --  85  --   --   --   --    < > = values in this interval not displayed.    Electrolytes Recent Labs  Lab 12/05/2019 1334 12/06/2019 1334 11/09/19 0145 11/09/19 0949 11/09/19 2302  11/09/19 2302 11/20/2019 0008 11/11/2019 0305 11/06/2019 0531  CALCIUM 8.1*   < > 7.8*   < > 7.2*   < > 7.3* 8.0* 7.4*  MG 1.5*  --  1.5*  --   --   --   --   --  2.4  PHOS 6.0*   < > 2.9  --  8.4*  --   --  8.6* 7.6*   < > = values in this interval not displayed.    CBC Recent Labs  Lab 11/09/19 0949 11/09/19 1039 11/09/19 1421 11/09/19 1721 12/06/2019 0531 11/26/2019 0541 11/24/2019 0843 11/24/2019 0941 11/06/2019 1038  WBC 11.5*  --  11.8*  --  6.3  --   --   --   --   HGB 11.5*   < > 11.5*   < >  9.6*   < > 9.2* 8.5* 7.8*  HCT 34.5*   < > 34.7*   < > 29.7*   < > 27.0* 25.0* 23.0*  PLT 107*  --  103*  --  89*  --   --   --   --    < > = values in this interval not displayed.    Coag's Recent Labs  Lab 11/04/19 1000 11/17/2019 1334 11/25/2019 0531  APTT 40* 46*  --   INR 1.0 1.3* 2.1*    Sepsis Markers Recent Labs  Lab 11/09/19 2302 11/28/2019 0305 11/17/2019 0531  LATICACIDVEN 8.2* 8.1* 6.2*    ABG Recent Labs  Lab 12/02/2019 0843 12/02/2019 0941 11/25/2019 1038  PHART 7.273* 7.323* 7.329*  PCO2ART 50.2* 42.1 41.8  PO2ART 64* 63* 75*    Liver Enzymes Recent Labs  Lab 11/04/19 1000 11/04/19 1000 11/09/19 0145 11/09/19 0145 11/09/19 2302 11/19/2019 0305 12/05/2019 0531  AST 24  --  303*  --   --   --  1,810*  ALT 29  --  259*  --   --   --  1,016*  ALKPHOS 62  --  38  --   --   --  52  BILITOT 0.9  --  1.7*  --   --   --  2.2*  ALBUMIN 3.9   < > 2.8*   < > 2.2* 3.0* 3.1*   < > = values in this interval not displayed.    Cardiac Enzymes No results for input(s): TROPONINI, PROBNP in the last 168 hours.  Glucose Recent Labs  Lab 11/09/19 1547 11/09/19 2149 11/09/19 2208 11/09/19 2355 11/28/2019 0006 11/18/2019 0756  GLUCAP 85 47* 250* 33* 88 98    Imaging DG Chest Port 1 View  Result Date: 11/27/2019 CLINICAL DATA:  Respiratory failure EXAM: PORTABLE CHEST 1 VIEW COMPARISON:  11/09/2019 FINDINGS: Overlying defibrillator pads. Endotracheal tube terminates  approximately 3.2 cm above the carina. Enteric tube terminates within the stomach. Right IJ central venous catheter and left subclavian central venous catheter tips terminate within the SVC. Left subclavian catheter no longer appears kinked. Stable heart size. Low lung volumes. Improving aeration within the lung bases. No pneumothorax. IMPRESSION: 1. Left subclavian catheter no longer appears kinked. Otherwise, stable lines and tubes. 2. Low lung volumes.  Improving aeration within the lung bases. Electronically Signed   By: Davina Poke D.O.   On: 11/18/2019 09:43   DG CHEST PORT 1 VIEW  Result Date: 11/09/2019 CLINICAL DATA:  Central line placement EXAM: PORTABLE CHEST 1 VIEW COMPARISON:  Nov 09, 2019 FINDINGS: The endotracheal tube terminates above the carina. There is a new left-sided central venous catheter with tip terminating over the left brachiocephalic vein. The catheter appears kinked in the midportion. There is a right-sided central venous catheter that is stable from prior study. The enteric tube terminates over the gastric body. There is a persistent opacity at the right lung base consistent with a right-sided pleural effusion with associated airspace disease. There is a small left-sided pleural effusion. The cardiac silhouette is unchanged. IMPRESSION: 1. New left subclavian approach central venous catheter. This catheter appears kinked in the midportion. There is no left-sided pneumothorax. 2. Remaining lines and tubes as above. 3. Persistent bilateral pleural effusions, right greater than left, with adjacent airspace disease. Electronically Signed   By: Constance Holster M.D.   On: 11/09/2019 17:16   DG CHEST PORT 1 VIEW  Result Date: 11/09/2019 CLINICAL DATA:  Intubation EXAM:  PORTABLE CHEST 1 VIEW COMPARISON:  Portable exam 1407 hours compared to 11/09/2019 FINDINGS: External pacing leads project over chest. Tip of endotracheal tube projects 3.9 cm above carina. Nasogastric tube extends  into abdomen. Normal heart size, mediastinal contours, and pulmonary vascularity. RIGHT pleural effusion and basilar atelectasis. Mild LEFT basilar atelectasis. Upper lungs clear. No pneumothorax or acute osseous findings. IMPRESSION: RIGHT pleural effusion and bibasilar atelectasis RIGHT greater than LEFT. Electronically Signed   By: Lavonia Dana M.D.   On: 11/09/2019 14:24   CT Angio Chest/Abd/Pel for Dissection W and/or W/WO  Result Date: 11/09/2019 : Alerts CLINICAL DATA:  Cardiac arrest. Recent aortobifem bypass. EXAM: CT ANGIOGRAPHY CHEST, ABDOMEN AND PELVIS TECHNIQUE: Multidetector CT imaging through the chest, abdomen and pelvis was performed using the standard protocol during bolus administration of intravenous contrast. Multiplanar reconstructed images and MIPs were obtained and reviewed to evaluate the vascular anatomy. CONTRAST:  129mL OMNIPAQUE IOHEXOL 350 MG/ML SOLN COMPARISON:  09/16/2019 FINDINGS: CTA CHEST FINDINGS Cardiovascular: Right IJ central venous catheter extends to the mid SVC. Heart size normal. Fair contrast opacification of pulmonary arterial branches. No convincing filling defects to suggest PE. Mild calcifications in the LAD. Extensive atheromatous plaque through the thoracic aorta. Peripherally calcified ductus diverticulum. Eccentric nonocclusive mural thrombus throughout the descending segment. No aneurysm, dissection or stenosis. Classic 3 vessel brachiocephalic arterial origin anatomy without proximal stenosis. Mediastinum/Nodes: No hilar or mediastinal adenopathy. Endotracheal tube in good position. Lungs/Pleura: Small left pleural effusion and trace right effusion. No pneumothorax. Consolidation/atelectasis of the right middle and lower lobes with probable occlusive central bronchial secretions. Dependent atelectasis in the posterior right upper lobe and posterior left lower lobe. Musculoskeletal: Anterior vertebral endplate spurring at multiple levels in the mid and lower  thoracic spine. No fracture or worrisome bone lesion. Review of the MIP images confirms the above findings. CTA ABDOMEN AND PELVIS FINDINGS VASCULAR Aorta: Eccentric nonocclusive mural thrombus in the native suprarenal and juxtarenal segments. Interval infrarenal aortobifem graft repair, patent. Coarse calcifications in the collapsed native aneurysm sac. Celiac: Patent without evidence of aneurysm, dissection, vasculitis or significant stenosis. SMA: Mild nonocclusive plaque. Classic distal branch anatomy. Renals: Duplicated left, inferior dominant with short-segment ostial stenosis. Single right, with partially calcified proximal plaque, no high-grade stenosis. IMA: Not opacified. Inflow: Both limbs of the aortobifem graft widely patent through the distal anastomoses. Interval long segment occlusion of right common iliac artery extending into internal and external iliac arteries, with distal reconstitution. Left common iliac is now occluded through its length, with collateral reconstitution of atheromatous nondilated external and internal iliac arteries. Patent but atheromatous proximal visualized lower extremity arterial outflow. Veins: No obvious venous abnormality within the limitations of this arterial phase study. Review of the MIP images confirms the above findings. NON-VASCULAR Hepatobiliary: Heterogenous parenchymal enhancement presumably related to early arterial phase timing of the scan, with limited portal vein enhancement. Gallbladder unremarkable. Pancreas: Unremarkable. No pancreatic ductal dilatation or surrounding inflammatory changes. Spleen: Heterogenous enhancement presumably related to early scan timing. Adrenals/Urinary Tract: Bilateral adrenal hypertrophy. No renal mass or hydronephrosis. Foley catheter decompresses urinary bladder. Stomach/Bowel: Orogastric tube extends into the stomach which is partially distended by gas and fluid. Fluid distention of the duodenum. Small bowel is  decompressed. Proximal colon is nondistended with a few scattered diverticula. Sigmoid colon is mildly distended proximally. Lymphatic: No definite adenopathy. Reproductive: Unremarkable Other: Small amount of pelvic and abdominal ascites. Left retroperitoneal poorly marginated fluid. A few bubbles of intraperitoneal gas presumably postop. Musculoskeletal: Interval midline anterior abdominal  wall incision. Advanced degenerative disc disease L5-S1. No fracture or worrisome bone lesion. Review of the MIP images confirms the above findings. IMPRESSION: 1. Negative for acute PE or thoracic aortic dissection. 2. Interval infrarenal aortobifem graft repair, patent. 3. 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 4. Small amount of pelvic and abdominal ascites. 5. Small left and trace right pleural effusions. 6. Right middle and lower lobe consolidation/atelectasis with probable occlusive central bronchial secretions. 7. Coronary artery disease. Aortic Atherosclerosis (ICD10-I70.0). Electronically Signed   By: Lucrezia Europe M.D.   On: 11/09/2019 16:03     STUDIES:    CULTURES: .N/A  ANTIBIOTICS: Anti-infectives (From admission, onward)   Start     Dose/Rate Route Frequency Ordered Stop   11/09/19 1900  cefTRIAXone (ROCEPHIN) 2 g in sodium chloride 0.9 % 100 mL IVPB     2 g 200 mL/hr over 30 Minutes Intravenous Every 24 hours 11/09/19 1725     11/09/19 1900  metroNIDAZOLE (FLAGYL) IVPB 500 mg     500 mg 100 mL/hr over 60 Minutes Intravenous Every 8 hours 11/09/19 1725     11/09/19 0800  ceFAZolin (ANCEF) IVPB 2g/100 mL premix     2 g 200 mL/hr over 30 Minutes Intravenous Every 8 hours 11/18/2019 1545 11/09/19 1559   11/20/2019 0625  ceFAZolin (ANCEF) IVPB 2g/100 mL premix     2 g 200 mL/hr over 30 Minutes Intravenous 30 min pre-op 11/13/2019 0625 11/06/2019 0840       SIGNIFICANT EVENTS: 11/09/2019 found to be hypoxic 11/09/2019 pulmonary critical care for  hypoxia 11/09/2019 Critical level hyperkalemia >7 admin calcium gluconate 12/04/2019 Procedure(s) Performed: THROMBECTOMY OF BYPASS GRAFT OF LEG (Right Groin) EXPLORATORY LAPAROTOMY (N/A Abdomen) Patch Angioplasty (Right Groin) Colon Resection Sigmoid (N/A Abdomen) Application Of Wound Vac (N/A Abdomen) Fasciotomy (Right Leg Lower)  LINES/TUBES: CVC Line  CVC Double Lumen 11/11/2019 Right Internal jugular 1 day  PIV Line  Peripheral IV 11/26/2019 Right Hand 2 days  Peripheral IV 11/09/19 Anterior;Distal;Right;Upper Arm less than 1 day    Post Cath / Sheath Left Arterial -- days  Fistula / Graft Left Upper arm Arteriovenous fistula 461 days  Hemodialysis Catheter Left Internal jugular Triple lumen Temporary (Non-Tunneled) less than 1 day     DISCUSSION: Postop day 2 status post open repair of juxtarenal abdominal aortic aneurysm. Patient has done very poorly postoperatively and now intubated on pressors with multisystem organ failure. Mottling was noted on lower right leg. Pt was noted to have dilated large bowel on imaging as well  Pt had brief cardiac arrest yesterday and was intubated and started on CRRT. His clinical course continued to deteriorate and developed refractory shock, hyperkalemia, lactic acidosis. Right leg became increasingly mottled. He was emergently taken to OR for lap and revealed ischemic left colon requiring left colectomy with abdominal wound vac in place.    Thrombectomy was also performed due to ischemic right leg as well as fasciotomy. Patient was then transported back to ICU where he is currently on dialysis and hemodynamically stable.   Pharm consulted for management of TPN over course of stay at ICU. Pt is NPO w colectomy and will undergo surgery Friday. TPN to start tomorrow.   ASSESSMENT / PLAN: Pt had left colectomy w discontinuity and VAC placement. No anesthesia or op complications noted. Pt initially presented to ICU post op aortofemoral bypass  graft. Patient went into cardiac arrest. Further complications included sever hyperkalemia, mottling of right lower extremity and  post op colonic illeus.  Patient is currently hemodynamically stable and on ventilation. Central line present, patient on hemodialysis.   Shock/MOS failure/Metabolic (lactic) Acidosis A: Shock w multisystem dysfunction post op AAA repair/ aortofemoral bypass- Likely due to ischemic dead bowel w subsequent lactic acidosis. Required urgent left colectomy. Portion of rectum also noted to be necrotic during operation w rectal stump remaining viable. Serum lactate notably elevated prior to emergent lap, svr hyperkalemia  P: - Cont Vasopressin 40units NaCl 214ml IV infusion - Cont Norepi IV 16mg  in 276mL at ordered rate - Goal MAP >65 SBP>90 - Hold antihypertensives - Fluid management I/Os   ESRD- refractory hyperkalemia Pt his hx of chronic kidney disease. Hyperkalemia likely due to noted ischemic and necrotic bowel causing multiorgan system failure. Pt was given calcium gluconate and bicarbonate at time of MOS. Post op potassium likely to normalize. Pt was treated acutely w calcium gluconate and bicarb in setting of lactic acidosis and MOS. Continue to monitor  - Hemodialysis  -Trend renal function panel - CMP and trend electrolytes K, Mg, phosphorus - TEG panel - Lactic acid check - Albumin 12.5 IV at 24ml/hr -- Nephrology recommendations appreciated - Daily weights / fluid removal rate  Ischemic Colitis Likely offending eti of MOS post op. Post op lap removal of necrotic large bowel pt has been stablized. Pt will have operation per surgery Friday.  -NPO, NGT placed -Appreciate pharm recommendations TPN Antibiotics Given (last 72 hours)    ceFAZolin (ANCEF) IVPB 2g/100 mL premix 2 g    cefTRIAXone (ROCEPHIN) 2 g in sodium chloride 0.9 % 100 mL IVPB 2 g 200 mL/hr   metroNIDAZOLE (FLAGYL) IVPB 500 mg 500 mg 100 mL/hr    Anemia- s/p 3 units of PRBC's  yesterday.   -CTM/ CBC   T2DM  Insulin aspart 0-15 unit inection SQ q6hrs    FAMILY  - Updates:  Family has been present and notified of the acute changes and emergent complications and procedures.    William Dalton MS4  11/25/2019, 1:11 PM

## 2019-11-10 NOTE — Op Note (Signed)
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  Left colectomy, left in discontinuity 2.  Abdominal VAC placement Surgeon: Dr. Serita Grammes Assistant: Dr. Fortunato Curling Estimated blood loss: 50 cc Specimens: Left colon to pathology complications: None Disposition Case turned back over to Dr. Carlis Abbott for completion  Indications: This is a 69 year old male who is postop day 2 status post open repair of a juxtarenal AAA.  Postoperatively he began deteriorating and he is intubated on pressors with multisystem organ failure.  He underwent a CTA the day prior to surgery that showed a somewhat distended sigmoid colon but no other abnormality and no pulmonary embolus.  Due to his worsening Dr. Carlis Abbott took him back to the operating room for evaluation.  I was asked to see him intraoperatively for an ischemic left colon.  Procedure: I scrubbed into the already open abdomen.  His sigmoid colon was necrotic.  It had not perforated.  A portion of the white line of Toldt had already been mobilized.  I then mobilized the white line of Toldt all the way down onto the rectum.  There was a portion of the rectum that was also necrotic as well.  I then divided the left colon with a GIA stapler.  I used a combination of the LigaSure as well as 2-0 silk suture to divide the mesentery.  I then was able to get down on the rectum to where I used a contour stapler to staple across it.  There were 2 other corners of the rectal stump that I removed as well as were necrotic.  In the end the rectal stump appeared viable.  I then reevaluated the left colon.  There certainly were signs of ischemia to it as well.  I elected due to his physiologic status that to remove this as well.  I then mobilized his white line of Toldt and his splenic flexure all the way back to his transverse colon where this was viable.  I then used the LigaSure to divide the  mesentery throughout this.  I used a GIA stapler to divide the transverse colon and passed this off as a specimen.  I elected to leave him in discontinuity to reevaluate this on Friday to give him a colostomy and closed his abdomen due to his ongoing multisystem organ failure and pressor requirement.  We irrigated copiously.  Dr. Carlis Abbott closed over his graft.  I then placed an abdominal wound VAC over all of this.  I then turned the case back over to Dr. Carlis Abbott for completion.  I will return him to the operating room on Friday.

## 2019-11-10 NOTE — Anesthesia Postprocedure Evaluation (Signed)
Anesthesia Post Note  Patient: Juan Bass  Procedure(s) Performed: OPEN JUXTARENAL ABDOMINAL AORTIC ANEURYSM REPAIR using 41mm x 3mm BIFURCATED HEMASHIELD GOLD GRAFT (N/A Abdomen)     Patient location during evaluation: ICU Anesthesia Type: General Level of consciousness: patient cooperative and awake Pain management: pain level controlled Vital Signs Assessment: post-procedure vital signs reviewed and stable Respiratory status: spontaneous breathing, nonlabored ventilation, respiratory function stable and patient connected to nasal cannula oxygen Cardiovascular status: blood pressure returned to baseline and stable Postop Assessment: no apparent nausea or vomiting Anesthetic complications: no    Last Vitals:  Vitals:   11/20/2019 0753 12/06/2019 0800  BP: (!) 108/47 111/63  Pulse: 73 74  Resp: 18 19  Temp: (!) 35.9 C (!) 35.9 C  SpO2: 97% 99%    Last Pain:  Vitals:   11/09/19 1215  TempSrc: Axillary  PainSc:                  [name]

## 2019-11-10 NOTE — Progress Notes (Signed)
Increasing right calf fullness this afternoon.  We performed right leg 4 compartment fasciotomies this morning and closed the skin with staples.  I subsequently removed the staples at bedside to allow the muscle to swell.  He has good posterior tibial and peroneal signals in right foot now after right leg thrombectomy earlier this morning.  He got several units of FFP after returning from the OR for an INR of 2.1.  Seems to have stabilized now on .01 vasopressin and 6 mcg Levophed and weaning.  Some increased O2 requirements on vent and CCM may start pulling volume. Updated family at bedside.  Plan is to return to the OR with general surgery on Friday for second look and possible stoma and abdominal closure.  Hopefully can close leg fasciotomies at that time.  Marty Heck, MD [facility] Office: [place]

## 2019-11-10 NOTE — Progress Notes (Signed)
[facility]  Subjective  -remains in multisystem organ failure intubated in ICU on CRRT.  Noted to have ischemic right leg last night with no signals.  Attempted volume resuscitation and wean pressors thinking this may be pressor induced.   Objective 122/68 73 (!) 96.8 F (36 C) 18 99%  Intake/Output Summary (Last 24 hours) at 12/04/2019 0748 Last data filed at 11/11/2019 0700 Gross per 24 hour  Intake 3222.96 ml  Output 1878 ml  Net 1344.96 ml    Abdominal and groin incisions are clean dry and intact Some mottling to the abdominal wall Palpable bilateral femoral pulses Left PT monophasic No signals in right foot  Laboratory Lab Results: Recent Labs    11/09/19 1421 11/09/19 1721 11/17/2019 0531 12/03/2019 0541  WBC 11.8*  --  6.3  --   HGB 11.5*   < > 9.6* 9.5*  HCT 34.7*   < > 29.7* 28.0*  PLT 103*  --  89*  --    < > = values in this interval not displayed.   BMET Recent Labs    11/24/2019 0305 11/22/2019 0305 11/13/2019 0531 11/17/2019 0541  NA 141   < > 139 138  K 6.7*   < > 6.2* 6.0*  CL 104  --  103  --   CO2 20*  --  20*  --   GLUCOSE 66*  --  85  --   BUN 33*  --  33*  --   CREATININE 5.28*  --  5.08*  --   CALCIUM 8.0*  --  7.4*  --    < > = values in this interval not displayed.    COAG Lab Results  Component Value Date   INR 2.1 (H) 11/24/2019   INR 1.3 (H) 12/01/2019   INR 1.0 11/04/2019   No results found for: PTT  Assessment/Planning:  Postop day 2 status post open repair of juxtarenal abdominal aortic aneurysm.  Patient has done very poorly postoperatively and now intubated on pressors with multisystem organ failure.  No signs of bleeding and hemoglobin has been relatively stable.  He got a CTA yesterday that did not have any overt findings related to his abdomen other than a distended sigmoid colon.  No signs of PE.  At this point given continued clinical decline I think I am obligated to perform exploratory  laparotomy to rule out any ischemic bowel and also attempt thrombectomy of right leg.  This may be all pressure induced but certainly has much more mottling on the right leg and the foot is cool with no signals.  Also possible atheroembolic event given he has a lot of thrombus in his suprarenal and thoracic aorta.  I have updated family given guarded prognosis.  Potassium is improving on CRRT.  Appreciate nephrology and critical care input.  Marty Heck 11/13/2019 7:48 AM --

## 2019-11-10 NOTE — Progress Notes (Signed)
Paged to bedside to evaluate pt with refractory shock. Pt on NE 30 Vaso 0.02, has been receiving IVF + albumin overnight. Patient on dexmet at 1.2 + fentanyl at 100.   Bradycardic, SBP 88-90, CI 1.6-1.8.  Pt also hyperkalemic, on CRRT. Lactic acid elevated to 8.1. Hypocalcemic.   -dc precedex. Some improvement in HR, CI, after this discontinuation. Anticipate that patient may take time to clear. -No indication for increasing sedation at this time. If need arises, would favor increasing/PRN bolus of fentanyl given hemodynamic instability.  -There is room to uptitrate both NE and vaso -d/w PCCM physician. Will place order for Epi-- if hemodynamics are not improving with available medications, this would be reasonable addition -continue monitoring via flowtrak -ECHO  -CalGlu -1 amp [place] MSN, AGACNP-BC [place] [phone] If no answer, [phone] 11/23/2019, 1:46 AM

## 2019-11-11 ENCOUNTER — Inpatient Hospital Stay (HOSPITAL_COMMUNITY): Payer: PPO

## 2019-11-11 LAB — COMPREHENSIVE METABOLIC PANEL
ALT: 816 U/L — ABNORMAL HIGH (ref 0–44)
AST: 2371 U/L — ABNORMAL HIGH (ref 15–41)
Albumin: 2.7 g/dL — ABNORMAL LOW (ref 3.5–5.0)
Alkaline Phosphatase: 75 U/L (ref 38–126)
Anion gap: 14 (ref 5–15)
BUN: 31 mg/dL — ABNORMAL HIGH (ref 8–23)
CO2: 23 mmol/L (ref 22–32)
Calcium: 6.8 mg/dL — ABNORMAL LOW (ref 8.9–10.3)
Chloride: 100 mmol/L (ref 98–111)
Creatinine, Ser: 4.08 mg/dL — ABNORMAL HIGH (ref 0.61–1.24)
GFR calc Af Amer: 16 mL/min — ABNORMAL LOW (ref >60)
GFR calc non Af Amer: 14 mL/min — ABNORMAL LOW (ref >60)
Glucose, Bld: 114 mg/dL — ABNORMAL HIGH (ref 70–99)
Potassium: 5.1 mmol/L (ref 3.5–5.1)
Sodium: 137 mmol/L (ref 135–145)
Total Bilirubin: 1.7 mg/dL — ABNORMAL HIGH (ref 0.3–1.2)
Total Protein: 5.2 g/dL — ABNORMAL LOW (ref 6.5–8.1)

## 2019-11-11 LAB — RENAL FUNCTION PANEL
Albumin: 2.8 g/dL — ABNORMAL LOW (ref 3.5–5.0)
Anion gap: 12 (ref 5–15)
BUN: 27 mg/dL — ABNORMAL HIGH (ref 8–23)
CO2: 22 mmol/L (ref 22–32)
Calcium: 6.8 mg/dL — ABNORMAL LOW (ref 8.9–10.3)
Chloride: 102 mmol/L (ref 98–111)
Creatinine, Ser: 3.74 mg/dL — ABNORMAL HIGH (ref 0.61–1.24)
GFR calc Af Amer: 18 mL/min — ABNORMAL LOW (ref >60)
GFR calc non Af Amer: 16 mL/min — ABNORMAL LOW (ref >60)
Glucose, Bld: 112 mg/dL — ABNORMAL HIGH (ref 70–99)
Phosphorus: 7 mg/dL — ABNORMAL HIGH (ref 2.5–4.6)
Potassium: 4.4 mmol/L (ref 3.5–5.1)
Sodium: 136 mmol/L (ref 135–145)

## 2019-11-11 LAB — BPAM FFP
Blood Product Expiration Date: 202105102359
Blood Product Expiration Date: 202105102359
Blood Product Expiration Date: 202105102359
Blood Product Expiration Date: 202105102359
ISSUE DATE / TIME: 202105050919
ISSUE DATE / TIME: 202105050947
ISSUE DATE / TIME: 202105051430
ISSUE DATE / TIME: 202105051430
Unit Type and Rh: 2800
Unit Type and Rh: 8400
Unit Type and Rh: 8400
Unit Type and Rh: 8400

## 2019-11-11 LAB — POCT I-STAT 7, (LYTES, BLD GAS, ICA,H+H)
Acid-Base Excess: 0 mmol/L (ref 0.0–2.0)
Acid-base deficit: 2 mmol/L (ref 0.0–2.0)
Bicarbonate: 26.8 mmol/L (ref 20.0–28.0)
Bicarbonate: 24.4 mmol/L (ref 20.0–28.0)
Calcium, Ion: 0.93 mmol/L — ABNORMAL LOW (ref 1.15–1.40)
Calcium, Ion: 0.9 mmol/L — ABNORMAL LOW (ref 1.15–1.40)
HCT: 29 % — ABNORMAL LOW (ref 39.0–52.0)
HCT: 28 % — ABNORMAL LOW (ref 39.0–52.0)
Hemoglobin: 9.9 g/dL — ABNORMAL LOW (ref 13.0–17.0)
Hemoglobin: 9.5 g/dL — ABNORMAL LOW (ref 13.0–17.0)
O2 Saturation: 99 %
O2 Saturation: 97 %
Patient temperature: 37.1
Patient temperature: 35.8
Potassium: 5.1 mmol/L (ref 3.5–5.1)
Potassium: 4.5 mmol/L (ref 3.5–5.1)
Sodium: 137 mmol/L (ref 135–145)
Sodium: 140 mmol/L (ref 135–145)
TCO2: 28 mmol/L (ref 22–32)
TCO2: 26 mmol/L (ref 22–32)
pCO2 arterial: 56.5 mmHg — ABNORMAL HIGH (ref 32.0–48.0)
pCO2 arterial: 46.4 mmHg (ref 32.0–48.0)
pH, Arterial: 7.285 — ABNORMAL LOW (ref 7.350–7.450)
pH, Arterial: 7.324 — ABNORMAL LOW (ref 7.350–7.450)
pO2, Arterial: 162 mmHg — ABNORMAL HIGH (ref 83.0–108.0)
pO2, Arterial: 93 mmHg (ref 83.0–108.0)

## 2019-11-11 LAB — CBC
HCT: 31.7 % — ABNORMAL LOW (ref 39.0–52.0)
Hemoglobin: 10.2 g/dL — ABNORMAL LOW (ref 13.0–17.0)
MCH: 27.3 pg (ref 26.0–34.0)
MCHC: 32.2 g/dL (ref 30.0–36.0)
MCV: 85 fL (ref 80.0–100.0)
Platelets: 74 10*3/uL — ABNORMAL LOW (ref 150–400)
RBC: 3.73 MIL/uL — ABNORMAL LOW (ref 4.22–5.81)
RDW: 17.1 % — ABNORMAL HIGH (ref 11.5–15.5)
WBC: 5.8 10*3/uL (ref 4.0–10.5)
nRBC: 1.2 % — ABNORMAL HIGH (ref 0.0–0.2)

## 2019-11-11 LAB — BPAM RBC
Blood Product Expiration Date: 202105222359
Blood Product Expiration Date: 202105272359
Blood Product Expiration Date: 202105292359
Blood Product Expiration Date: 202105292359
Blood Product Expiration Date: 202105292359
Blood Product Expiration Date: 202106052359
Blood Product Expiration Date: 202106052359
ISSUE DATE / TIME: 202104300141
ISSUE DATE / TIME: 202105030721
ISSUE DATE / TIME: 202105030721
ISSUE DATE / TIME: 202105030727
ISSUE DATE / TIME: 202105050759
ISSUE DATE / TIME: 202105050759
Unit Type and Rh: 6200
Unit Type and Rh: 6200
Unit Type and Rh: 7300
Unit Type and Rh: 7300
Unit Type and Rh: 8400
Unit Type and Rh: 8400
Unit Type and Rh: 8400

## 2019-11-11 LAB — TYPE AND SCREEN
ABO/RH(D): AB POS
Antibody Screen: NEGATIVE
Unit division: 0
Unit division: 0
Unit division: 0
Unit division: 0
Unit division: 0
Unit division: 0
Unit division: 0

## 2019-11-11 LAB — PREPARE FRESH FROZEN PLASMA
Unit division: 0
Unit division: 0
Unit division: 0

## 2019-11-11 LAB — MAGNESIUM: Magnesium: 2.6 mg/dL — ABNORMAL HIGH (ref 1.7–2.4)

## 2019-11-11 LAB — PREPARE PLATELET PHERESIS: Unit division: 0

## 2019-11-11 LAB — GLUCOSE, CAPILLARY
Glucose-Capillary: 105 mg/dL — ABNORMAL HIGH (ref 70–99)
Glucose-Capillary: 106 mg/dL — ABNORMAL HIGH (ref 70–99)
Glucose-Capillary: 104 mg/dL — ABNORMAL HIGH (ref 70–99)
Glucose-Capillary: 119 mg/dL — ABNORMAL HIGH (ref 70–99)
Glucose-Capillary: 202 mg/dL — ABNORMAL HIGH (ref 70–99)

## 2019-11-11 LAB — DIFFERENTIAL
Abs Immature Granulocytes: 0.02 10*3/uL (ref 0.00–0.07)
Basophils Absolute: 0 10*3/uL (ref 0.0–0.1)
Basophils Relative: 1 %
Eosinophils Absolute: 0 10*3/uL (ref 0.0–0.5)
Eosinophils Relative: 0 %
Immature Granulocytes: 0 %
Lymphocytes Relative: 7 %
Lymphs Abs: 0.4 10*3/uL — ABNORMAL LOW (ref 0.7–4.0)
Monocytes Absolute: 0.2 10*3/uL (ref 0.1–1.0)
Monocytes Relative: 3 %
Neutro Abs: 5.2 10*3/uL (ref 1.7–7.7)
Neutrophils Relative %: 89 %

## 2019-11-11 LAB — TRIGLYCERIDES: Triglycerides: 147 mg/dL (ref <150)

## 2019-11-11 LAB — PROTIME-INR
INR: 1.7 — ABNORMAL HIGH (ref 0.8–1.2)
Prothrombin Time: 19.5 seconds — ABNORMAL HIGH (ref 11.4–15.2)

## 2019-11-11 LAB — BPAM PLATELET PHERESIS
Blood Product Expiration Date: 202105062359
ISSUE DATE / TIME: 202105050852
Unit Type and Rh: 6200

## 2019-11-11 LAB — LACTIC ACID, PLASMA
Lactic Acid, Venous: 3.9 mmol/L (ref 0.5–1.9)
Lactic Acid, Venous: 2.7 mmol/L (ref 0.5–1.9)

## 2019-11-11 LAB — PREALBUMIN: Prealbumin: 15.5 mg/dL — ABNORMAL LOW (ref 18–38)

## 2019-11-11 LAB — PHOSPHORUS: Phosphorus: 7.8 mg/dL — ABNORMAL HIGH (ref 2.5–4.6)

## 2019-11-11 LAB — POTASSIUM: Potassium: 4.7 mmol/L (ref 3.5–5.1)

## 2019-11-11 MED ORDER — ALBUMIN HUMAN 25 % IV SOLN
25.0000 g | Freq: Once | INTRAVENOUS | Status: AC
  Administered 2019-11-11: 25 g via INTRAVENOUS
  Filled 2019-11-11: qty 100

## 2019-11-11 MED ORDER — DEXTROSE-NACL 5-0.9 % IV SOLN: INTRAVENOUS | Status: DC

## 2019-11-11 MED ORDER — ALBUMIN HUMAN 5 % IV SOLN: 12.5000 g | Freq: Once | INTRAVENOUS | Status: AC

## 2019-11-11 MED ORDER — SODIUM CHLORIDE 0.9 % IV BOLUS
500.0000 mL | Freq: Once | INTRAVENOUS | Status: AC
  Administered 2019-11-11: 500 mL via INTRAVENOUS

## 2019-11-11 MED ORDER — PRISMASOL BGK 4/2.5 32-4-2.5 MEQ/L IV SOLN: INTRAVENOUS | Status: DC

## 2019-11-11 MED ORDER — TRACE MINERALS CU-MN-SE-ZN 300-55-60-3000 MCG/ML IV SOLN
INTRAVENOUS | Status: AC
  Filled 2019-11-11: qty 498.4

## 2019-11-11 MED ORDER — ALBUMIN HUMAN 5 % IV SOLN
INTRAVENOUS | Status: AC
  Filled 2019-11-11: qty 250

## 2019-11-11 MED ORDER — AMIODARONE HCL IN DEXTROSE 360-4.14 MG/200ML-% IV SOLN
30.0000 mg/h | INTRAVENOUS | Status: DC
  Administered 2019-11-11 – 2019-11-16 (×10): 30 mg/h via INTRAVENOUS
  Filled 2019-11-11 (×9): qty 200

## 2019-11-11 MED ORDER — INSULIN ASPART 100 UNIT/ML ~~LOC~~ SOLN: 0.0000 [IU] | Freq: Three times a day (TID) | SUBCUTANEOUS | Status: DC

## 2019-11-11 MED ORDER — PRISMASOL BGK 4/2.5 32-4-2.5 MEQ/L REPLACEMENT SOLN: Status: DC

## 2019-11-11 MED ORDER — AMIODARONE LOAD VIA INFUSION
150.0000 mg | Freq: Once | INTRAVENOUS | Status: AC
  Administered 2019-11-11: 150 mg via INTRAVENOUS
  Filled 2019-11-11: qty 83.34

## 2019-11-11 MED ORDER — AMIODARONE HCL IN DEXTROSE 360-4.14 MG/200ML-% IV SOLN
60.0000 mg/h | INTRAVENOUS | Status: AC
  Administered 2019-11-11 (×2): 60 mg/h via INTRAVENOUS
  Filled 2019-11-11 (×2): qty 200

## 2019-11-11 NOTE — Progress Notes (Signed)
[name], male   DOB: 12-20-1950, 69 y.o.   MRN: [id number] S: Remains on 2 pressors and having issues with LIJ temp HD cath (positional).  Also not on 150 dialyzer, was started on 100.  K remains mildly elevated overnight. O:BP (!) 109/49   Pulse 84   Temp (!) 96.4 F (35.8 C)   Resp (!) 22   Ht 5\' 9"  (1.753 m)   Wt 79.6 kg   SpO2 100%   BMI 25.91 kg/m   Intake/Output Summary (Last 24 hours) at 11/11/2019 0909 Last data filed at 11/11/2019 0800 Gross per 24 hour  Intake 4417.76 ml  Output 3220 ml  Net 1197.76 ml   Intake/Output: I/O last 3 completed shifts: In: 5888.7 [I.V.:2802.9; Blood:2055.7; NG/GT:30; IV Piggyback:1000.1] Out: 9509 [Urine:30; Emesis/NG output:50; Drains:1125; TOIZT:2458; Blood:350]  Intake/Output this shift:  Total I/O In: 645.8 [I.V.:545.8; IV Piggyback:100] Out: 104 [Other:104] Weight change: -2.3 kg Gen: intubated and sedated CVS: RRR Resp: cta Abd: wound vac in place Ext: no edema, LUE AVF +T/B, ischemic changes to bilateral lower extremities, R>L  Recent Labs  Lab 11/04/19 1000 11/04/19 1000 12/01/2019 0705 11/11/2019 1334 11/09/19 0145 11/09/19 0759 11/09/19 2302 11/09/19 2302 12/06/2019 0008 11/18/2019 0008 12/02/2019 0305 11/07/2019 0305 11/25/2019 0531 11/15/2019 0541 11/30/2019 1240 11/24/2019 1248 11/25/2019 1437 11/13/2019 1613 11/11/19 0221 11/11/19 0237 11/11/19 0650  NA 140   < >   < > 137 139   < > 138   < > 138   < > 141   < > 139   < > 139 138 139 140 137 137 140  K 4.5   < >   < > 5.8* 3.5   < > >7.5*   < > >7.5*   < > 6.7*   < > 6.2*   < > 6.4* 6.3* 5.7* 5.9* 5.1 5.1 4.5  CL 99   < >   < > 107 104   < > 105  --  105  --  104  --  103  --  103  --   --  105 100  --   --   CO2 29   < >  --  21* 23   < > 19*  --  17*  --  20*  --  20*  --  19*  --   --  21* 23  --   --   GLUCOSE 216*   < >   < > 202* 77   < > 131*  --  89  --  66*  --  85  --  88  --   --  104* 114*  --   --   BUN 41*   < >   < > 43* 24*   < > 33*  --  33*   --  33*  --  33*  --  39*  --   --  37* 31*  --   --   CREATININE 8.17*   < >   < > 7.55* 5.14*   < > 5.47*  --  5.38*  --  5.28*  --  5.08*  --  5.17*  --   --  4.74* 4.08*  --   --   ALBUMIN 3.9   < >  --   --  2.8*  --  2.2*  --   --   --  3.0*  --  3.1*  --  2.6*  --   --  2.8* 2.7*  --   --   CALCIUM 9.3   < >  --  8.1* 7.8*   < > 7.2*  --  7.3*  --  8.0*  --  7.4*  --  7.1*  --   --  7.0* 6.8*  --   --   PHOS  --   --   --  6.0* 2.9  --  8.4*  --   --   --  8.6*  --  7.6*  --   --   --   --  8.7* 7.8*  --   --   AST 24  --   --   --  303*  --   --   --   --   --   --   --  1,810*  --  2,133*  --   --   --  2,371*  --   --   ALT 29  --   --   --  259*  --   --   --   --   --   --   --  1,016*  --  848*  --   --   --  816*  --   --    < > = values in this interval not displayed.   Liver Function Tests: Recent Labs  Lab 11/22/2019 0531 11/21/2019 0531 11/23/2019 1240 11/26/2019 1613 11/11/19 0221  AST 1,810*  --  2,133*  --  2,371*  ALT 1,016*  --  848*  --  816*  ALKPHOS 52  --  52  --  75  BILITOT 2.2*  --  2.0*  --  1.7*  PROT 5.1*  --  4.5*  --  5.2*  ALBUMIN 3.1*   < > 2.6* 2.8* 2.7*   < > = values in this interval not displayed.   Recent Labs  Lab 11/09/19 0145  AMYLASE 258*   No results for input(s): AMMONIA in the last 168 hours. CBC: Recent Labs  Lab 11/09/19 0949 11/09/19 1039 11/09/19 1421 11/09/19 1721 11/09/2019 0531 12/06/2019 0541 11/24/2019 1240 11/09/2019 1248 11/11/19 0221 11/11/19 0237 11/11/19 0650  WBC 11.5*   < > 11.8*   < > 6.3  --  3.9*  --  5.8  --   --   NEUTROABS  --   --   --   --  5.1  --   --   --  5.2  --   --   HGB 11.5*   < > 11.5*   < > 9.6*   < > 10.4*   < > 10.2* 9.9* 9.5*  HCT 34.5*   < > 34.7*   < > 29.7*   < > 31.6*   < > 31.7* 29.0* 28.0*  MCV 83.9  --  84.6  --  85.1  --  84.9  --  85.0  --   --   PLT 107*   < > 103*   < > 89*  --  89*  --  74*  --   --    < > = values in this interval not displayed.   Cardiac Enzymes: No results for  input(s): CKTOTAL, CKMB, CKMBINDEX, TROPONINI in the last 168 hours. CBG: Recent Labs  Lab 12/02/2019 1606 11/24/2019 2114 11/11/2019 2354 11/11/19 0513 11/11/19 0649  GLUCAP 102* 96 88 105* 106*    Iron Studies: No results for input(s): IRON, TIBC, TRANSFERRIN, FERRITIN in the last 72  hours. Studies/Results: DG CHEST PORT 1 VIEW  Result Date: 11/11/2019 CLINICAL DATA:  Intubation. EXAM: PORTABLE CHEST 1 VIEW COMPARISON:  11/27/2019. FINDINGS: Endotracheal tube, NG tube, right IJ sheath, left subclavian line in stable position. Heart size stable. Bibasilar infiltrates/edema noted on today's exam. Bibasilar atelectasis. Small right pleural effusion. No pneumothorax. Degenerative change thoracic spine. IMPRESSION: 1. Bibasilar infiltrates/edema noted on today's exam. Bibasilar atelectasis. Small right pleural effusion. 2.  Lines and tubes in stable position. Electronically Signed   By: Marcello Moores  Register   On: 11/11/2019 08:10   DG Chest Port 1 View  Result Date: 12/05/2019 CLINICAL DATA:  Respiratory failure EXAM: PORTABLE CHEST 1 VIEW COMPARISON:  11/09/2019 FINDINGS: Overlying defibrillator pads. Endotracheal tube terminates approximately 3.2 cm above the carina. Enteric tube terminates within the stomach. Right IJ central venous catheter and left subclavian central venous catheter tips terminate within the SVC. Left subclavian catheter no longer appears kinked. Stable heart size. Low lung volumes. Improving aeration within the lung bases. No pneumothorax. IMPRESSION: 1. Left subclavian catheter no longer appears kinked. Otherwise, stable lines and tubes. 2. Low lung volumes.  Improving aeration within the lung bases. Electronically Signed   By: Davina Poke D.O.   On: 12/06/2019 09:43   DG CHEST PORT 1 VIEW  Result Date: 11/09/2019 CLINICAL DATA:  Central line placement EXAM: PORTABLE CHEST 1 VIEW COMPARISON:  Nov 09, 2019 FINDINGS: The endotracheal tube terminates above the carina. There is a new  left-sided central venous catheter with tip terminating over the left brachiocephalic vein. The catheter appears kinked in the midportion. There is a right-sided central venous catheter that is stable from prior study. The enteric tube terminates over the gastric body. There is a persistent opacity at the right lung base consistent with a right-sided pleural effusion with associated airspace disease. There is a small left-sided pleural effusion. The cardiac silhouette is unchanged. IMPRESSION: 1. New left subclavian approach central venous catheter. This catheter appears kinked in the midportion. There is no left-sided pneumothorax. 2. Remaining lines and tubes as above. 3. Persistent bilateral pleural effusions, right greater than left, with adjacent airspace disease. Electronically Signed   By: Constance Holster M.D.   On: 11/09/2019 17:16   DG CHEST PORT 1 VIEW  Result Date: 11/09/2019 CLINICAL DATA:  Intubation EXAM: PORTABLE CHEST 1 VIEW COMPARISON:  Portable exam 1407 hours compared to 11/09/2019 FINDINGS: External pacing leads project over chest. Tip of endotracheal tube projects 3.9 cm above carina. Nasogastric tube extends into abdomen. Normal heart size, mediastinal contours, and pulmonary vascularity. RIGHT pleural effusion and basilar atelectasis. Mild LEFT basilar atelectasis. Upper lungs clear. No pneumothorax or acute osseous findings. IMPRESSION: RIGHT pleural effusion and bibasilar atelectasis RIGHT greater than LEFT. Electronically Signed   By: Lavonia Dana M.D.   On: 11/09/2019 14:24   CT Angio Chest/Abd/Pel for Dissection W and/or W/WO  Result Date: 11/09/2019 : Alerts CLINICAL DATA:  Cardiac arrest. Recent aortobifem bypass. EXAM: CT ANGIOGRAPHY CHEST, ABDOMEN AND PELVIS TECHNIQUE: Multidetector CT imaging through the chest, abdomen and pelvis was performed using the standard protocol during bolus administration of intravenous contrast. Multiplanar reconstructed images and MIPs were  obtained and reviewed to evaluate the vascular anatomy. CONTRAST:  13mL OMNIPAQUE IOHEXOL 350 MG/ML SOLN COMPARISON:  09/16/2019 FINDINGS: CTA CHEST FINDINGS Cardiovascular: Right IJ central venous catheter extends to the mid SVC. Heart size normal. Fair contrast opacification of pulmonary arterial branches. No convincing filling defects to suggest PE. Mild calcifications in the LAD. Extensive atheromatous plaque  through the thoracic aorta. Peripherally calcified ductus diverticulum. Eccentric nonocclusive mural thrombus throughout the descending segment. No aneurysm, dissection or stenosis. Classic 3 vessel brachiocephalic arterial origin anatomy without proximal stenosis. Mediastinum/Nodes: No hilar or mediastinal adenopathy. Endotracheal tube in good position. Lungs/Pleura: Small left pleural effusion and trace right effusion. No pneumothorax. Consolidation/atelectasis of the right middle and lower lobes with probable occlusive central bronchial secretions. Dependent atelectasis in the posterior right upper lobe and posterior left lower lobe. Musculoskeletal: Anterior vertebral endplate spurring at multiple levels in the mid and lower thoracic spine. No fracture or worrisome bone lesion. Review of the MIP images confirms the above findings. CTA ABDOMEN AND PELVIS FINDINGS VASCULAR Aorta: Eccentric nonocclusive mural thrombus in the native suprarenal and juxtarenal segments. Interval infrarenal aortobifem graft repair, patent. Coarse calcifications in the collapsed native aneurysm sac. Celiac: Patent without evidence of aneurysm, dissection, vasculitis or significant stenosis. SMA: Mild nonocclusive plaque. Classic distal branch anatomy. Renals: Duplicated left, inferior dominant with short-segment ostial stenosis. Single right, with partially calcified proximal plaque, no high-grade stenosis. IMA: Not opacified. Inflow: Both limbs of the aortobifem graft widely patent through the distal anastomoses. Interval  long segment occlusion of right common iliac artery extending into internal and external iliac arteries, with distal reconstitution. Left common iliac is now occluded through its length, with collateral reconstitution of atheromatous nondilated external and internal iliac arteries. Patent but atheromatous proximal visualized lower extremity arterial outflow. Veins: No obvious venous abnormality within the limitations of this arterial phase study. Review of the MIP images confirms the above findings. NON-VASCULAR Hepatobiliary: Heterogenous parenchymal enhancement presumably related to early arterial phase timing of the scan, with limited portal vein enhancement. Gallbladder unremarkable. Pancreas: Unremarkable. No pancreatic ductal dilatation or surrounding inflammatory changes. Spleen: Heterogenous enhancement presumably related to early scan timing. Adrenals/Urinary Tract: Bilateral adrenal hypertrophy. No renal mass or hydronephrosis. Foley catheter decompresses urinary bladder. Stomach/Bowel: Orogastric tube extends into the stomach which is partially distended by gas and fluid. Fluid distention of the duodenum. Small bowel is decompressed. Proximal colon is nondistended with a few scattered diverticula. Sigmoid colon is mildly distended proximally. Lymphatic: No definite adenopathy. Reproductive: Unremarkable Other: Small amount of pelvic and abdominal ascites. Left retroperitoneal poorly marginated fluid. A few bubbles of intraperitoneal gas presumably postop. Musculoskeletal: Interval midline anterior abdominal wall incision. Advanced degenerative disc disease L5-S1. No fracture or worrisome bone lesion. Review of the MIP images confirms the above findings. IMPRESSION: 1. Negative for acute PE or thoracic aortic dissection. 2. Interval infrarenal aortobifem graft repair, patent. 3. 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 4. Small amount of pelvic and  abdominal ascites. 5. Small left and trace right pleural effusions. 6. Right middle and lower lobe consolidation/atelectasis with probable occlusive central bronchial secretions. 7. Coronary artery disease. Aortic Atherosclerosis (ICD10-I70.0). Electronically Signed   By: Lucrezia Europe M.D.   On: 11/09/2019 16:03   . chlorhexidine  15 mL Mouth Rinse BID  . Chlorhexidine Gluconate Cloth  6 each Topical Q0600  . heparin  5,000 Units Subcutaneous Q8H  . insulin aspart  0-15 Units Subcutaneous Q6H  . mouth rinse  15 mL Mouth Rinse Q2H  . pantoprazole (PROTONIX) IV  40 mg Intravenous QHS  . sodium bicarbonate  50 mEq Intravenous Once  . sodium chloride flush  10-40 mL Intracatheter Q12H    BMET    Component Value Date/Time   NA 140 11/11/2019 0650   K 4.5 11/11/2019 0650   CL 100 11/11/2019  0221   CO2 23 11/11/2019 0221   GLUCOSE 114 (H) 11/11/2019 0221   BUN 31 (H) 11/11/2019 0221   CREATININE 4.08 (H) 11/11/2019 0221   CALCIUM 6.8 (L) 11/11/2019 0221   GFRNONAA 14 (L) 11/11/2019 0221   GFRAA 16 (L) 11/11/2019 0221   CBC    Component Value Date/Time   WBC 5.8 11/11/2019 0221   RBC 3.73 (L) 11/11/2019 0221   HGB 9.5 (L) 11/11/2019 0650   HCT 28.0 (L) 11/11/2019 0650   PLT 74 (L) 11/11/2019 0221   MCV 85.0 11/11/2019 0221   MCH 27.3 11/11/2019 0221   MCHC 32.2 11/11/2019 0221   RDW 17.1 (H) 11/11/2019 0221   LYMPHSABS 0.4 (L) 11/11/2019 0221   MONOABS 0.2 11/11/2019 0221   EOSABS 0.0 11/11/2019 0221   BASOSABS 0.0 11/11/2019 0221    Assessment/Plan:  1. Shock with multisystem organ failure following AAA repair- due to ischemic/dead bowel s/p emergent exp lap and left colectomy 11/23/2019. 1. Will keep even with CRRT as he is on 2 pressors. 2. AAA- s/p repair 11/06/2019 3. ESRD- currently on CRRT due to the development of shock with MOS failure.  Currently on all 0K/2.5Ca due to refractory hyperkalemia.  K finally improving.  Will likely need to change to added K baths later since he  has successfully undergone left colectomy and right leg thrombectomy.  Will keep even for now with CRRT.   4. PAD- ischemic BLE, s/p thrombectomy of right.  Plan per [facility] 5. Hyperkalemia- changed to 0K bath but likely due to ischemic bowel.  Will recheck labs as his potassium will likely be markedly improved after surgery.  Improved with CRRT and 0K bath. 6. VDRF/SOB- intubated 11/09/19 after cardiac arrest.  His earlier SOB was likely due to ischemic bowel and the development of lactic acidosis. PCCM following now.  7. Anemia- s/p 3 units of PRBC's yesterday. Follow H/H 8. HTN- meds on hold due to shock. Continue to follow. 9. CKD MBD- low phos, hold binders  Donetta Potts, MD [employer] [phone]

## 2019-11-11 NOTE — Progress Notes (Signed)
[name], male   DOB: 11-02-1950, 69 y.o.   MRN: [id number]    1 Day Post-Op  Subjective: Sedated on vent.  RN states still on 2 pressors, levo, vaso.  On CRRT.  RLE has dopplered pulse  ROS: See above, otherwise other systems negative  Objective: Vital signs in last 24 hours: Temp:  [95.9 F (35.5 C)-99.1 F (37.3 C)] 96.1 F (35.6 C) (05/06 0715) Pulse Rate:  [25-91] 81 (05/06 0805) Resp:  [18-22] 22 (05/06 0805) BP: (92-141)/(46-74) 109/49 (05/06 0805) SpO2:  [64 %-100 %] 99 % (05/06 0805) Arterial Line BP: (83-143)/(37-60) 117/45 (05/06 0715) FiO2 (%):  [80 %-100 %] 80 % (05/06 0805) Weight:  [79.6 kg] 79.6 kg (05/06 0600) Last BM Date: 11/20/2019  Intake/Output from previous day: 05/05 0701 - 05/06 0700 In: 9323.5 [I.V.:1483.6; Blood:2055.7; NG/GT:30; IV Piggyback:299.9] Out: 5732 [Emesis/NG output:50; Drains:1125; Blood:350] Intake/Output this shift: No intake/output data recorded.  PE: Gen: critically ill on vent Abd: soft, ABTHERA vac in place, unable to assess tenderness Ext: RLE mottled but doppler pulse per RN  Lab Results:  Recent Labs    12/04/2019 1240 12/06/2019 1248 11/11/19 0221 11/11/19 0221 11/11/19 0237 11/11/19 0650  WBC 3.9*  --  5.8  --   --   --   HGB 10.4*   < > 10.2*   < > 9.9* 9.5*  HCT 31.6*   < > 31.7*   < > 29.0* 28.0*  PLT 89*  --  74*  --   --   --    < > = values in this interval not displayed.   BMET Recent Labs    12/03/2019 1613 12/05/2019 1613 11/11/19 0221 11/11/19 0221 11/11/19 0237 11/11/19 0650  NA 140   < > 137   < > 137 140  K 5.9*   < > 5.1   < > 5.1 4.5  CL 105  --  100  --   --   --   CO2 21*  --  23  --   --   --   GLUCOSE 104*  --  114*  --   --   --   BUN 37*  --  31*  --   --   --   CREATININE 4.74*  --  4.08*  --   --   --   CALCIUM 7.0*  --  6.8*  --   --   --    < > = values in this interval not displayed.   PT/INR Recent Labs    11/11/2019 1240 11/11/19 0221  LABPROT 22.9* 19.5*    INR 2.1* 1.7*   CMP     Component Value Date/Time   NA 140 11/11/2019 0650   K 4.5 11/11/2019 0650   CL 100 11/11/2019 0221   CO2 23 11/11/2019 0221   GLUCOSE 114 (H) 11/11/2019 0221   BUN 31 (H) 11/11/2019 0221   CREATININE 4.08 (H) 11/11/2019 0221   CALCIUM 6.8 (L) 11/11/2019 0221   PROT 5.2 (L) 11/11/2019 0221   ALBUMIN 2.7 (L) 11/11/2019 0221   AST 2,371 (H) 11/11/2019 0221   ALT 816 (H) 11/11/2019 0221   ALKPHOS 75 11/11/2019 0221   BILITOT 1.7 (H) 11/11/2019 0221   GFRNONAA 14 (L) 11/11/2019 0221   GFRAA 16 (L) 11/11/2019 0221   Lipase     Component Value Date/Time   LIPASE 43 03/18/2019 0849       Studies/Results: DG Abd 1 View  Result Date: 11/09/2019 CLINICAL DATA:  Abdominal distension EXAM: ABDOMEN - 1 VIEW COMPARISON:  09/08/2019 FINDINGS: NG tube extends into the stomach. There is gas and stool in the rectosigmoid colon which is somewhat featureless. No small bowel obstruction identified. IMPRESSION: NG tube in stomach.  Prominent gas and stool-filled sigmoid colon. Electronically Signed   By: Suzy Bouchard M.D.   On: 11/09/2019 08:54   DG CHEST PORT 1 VIEW  Result Date: 11/11/2019 CLINICAL DATA:  Intubation. EXAM: PORTABLE CHEST 1 VIEW COMPARISON:  11/19/2019. FINDINGS: Endotracheal tube, NG tube, right IJ sheath, left subclavian line in stable position. Heart size stable. Bibasilar infiltrates/edema noted on today's exam. Bibasilar atelectasis. Small right pleural effusion. No pneumothorax. Degenerative change thoracic spine. IMPRESSION: 1. Bibasilar infiltrates/edema noted on today's exam. Bibasilar atelectasis. Small right pleural effusion. 2.  Lines and tubes in stable position. Electronically Signed   By: Marcello Moores  Register   On: 11/11/2019 08:10   DG Chest Port 1 View  Result Date: 12/04/2019 CLINICAL DATA:  Respiratory failure EXAM: PORTABLE CHEST 1 VIEW COMPARISON:  11/09/2019 FINDINGS: Overlying defibrillator pads. Endotracheal tube terminates  approximately 3.2 cm above the carina. Enteric tube terminates within the stomach. Right IJ central venous catheter and left subclavian central venous catheter tips terminate within the SVC. Left subclavian catheter no longer appears kinked. Stable heart size. Low lung volumes. Improving aeration within the lung bases. No pneumothorax. IMPRESSION: 1. Left subclavian catheter no longer appears kinked. Otherwise, stable lines and tubes. 2. Low lung volumes.  Improving aeration within the lung bases. Electronically Signed   By: Davina Poke D.O.   On: 11/25/2019 09:43   DG CHEST PORT 1 VIEW  Result Date: 11/09/2019 CLINICAL DATA:  Central line placement EXAM: PORTABLE CHEST 1 VIEW COMPARISON:  Nov 09, 2019 FINDINGS: The endotracheal tube terminates above the carina. There is a new left-sided central venous catheter with tip terminating over the left brachiocephalic vein. The catheter appears kinked in the midportion. There is a right-sided central venous catheter that is stable from prior study. The enteric tube terminates over the gastric body. There is a persistent opacity at the right lung base consistent with a right-sided pleural effusion with associated airspace disease. There is a small left-sided pleural effusion. The cardiac silhouette is unchanged. IMPRESSION: 1. New left subclavian approach central venous catheter. This catheter appears kinked in the midportion. There is no left-sided pneumothorax. 2. Remaining lines and tubes as above. 3. Persistent bilateral pleural effusions, right greater than left, with adjacent airspace disease. Electronically Signed   By: Constance Holster M.D.   On: 11/09/2019 17:16   DG CHEST PORT 1 VIEW  Result Date: 11/09/2019 CLINICAL DATA:  Intubation EXAM: PORTABLE CHEST 1 VIEW COMPARISON:  Portable exam 1407 hours compared to 11/09/2019 FINDINGS: External pacing leads project over chest. Tip of endotracheal tube projects 3.9 cm above carina. Nasogastric tube extends  into abdomen. Normal heart size, mediastinal contours, and pulmonary vascularity. RIGHT pleural effusion and basilar atelectasis. Mild LEFT basilar atelectasis. Upper lungs clear. No pneumothorax or acute osseous findings. IMPRESSION: RIGHT pleural effusion and bibasilar atelectasis RIGHT greater than LEFT. Electronically Signed   By: Lavonia Dana M.D.   On: 11/09/2019 14:24   CT Angio Chest/Abd/Pel for Dissection W and/or W/WO  Result Date: 11/09/2019 : Alerts CLINICAL DATA:  Cardiac arrest. Recent aortobifem bypass. EXAM: CT ANGIOGRAPHY CHEST, ABDOMEN AND PELVIS TECHNIQUE: Multidetector CT imaging through the chest, abdomen and pelvis was performed using the standard protocol during bolus administration of  intravenous contrast. Multiplanar reconstructed images and MIPs were obtained and reviewed to evaluate the vascular anatomy. CONTRAST:  117mL OMNIPAQUE IOHEXOL 350 MG/ML SOLN COMPARISON:  09/16/2019 FINDINGS: CTA CHEST FINDINGS Cardiovascular: Right IJ central venous catheter extends to the mid SVC. Heart size normal. Fair contrast opacification of pulmonary arterial branches. No convincing filling defects to suggest PE. Mild calcifications in the LAD. Extensive atheromatous plaque through the thoracic aorta. Peripherally calcified ductus diverticulum. Eccentric nonocclusive mural thrombus throughout the descending segment. No aneurysm, dissection or stenosis. Classic 3 vessel brachiocephalic arterial origin anatomy without proximal stenosis. Mediastinum/Nodes: No hilar or mediastinal adenopathy. Endotracheal tube in good position. Lungs/Pleura: Small left pleural effusion and trace right effusion. No pneumothorax. Consolidation/atelectasis of the right middle and lower lobes with probable occlusive central bronchial secretions. Dependent atelectasis in the posterior right upper lobe and posterior left lower lobe. Musculoskeletal: Anterior vertebral endplate spurring at multiple levels in the mid and lower  thoracic spine. No fracture or worrisome bone lesion. Review of the MIP images confirms the above findings. CTA ABDOMEN AND PELVIS FINDINGS VASCULAR Aorta: Eccentric nonocclusive mural thrombus in the native suprarenal and juxtarenal segments. Interval infrarenal aortobifem graft repair, patent. Coarse calcifications in the collapsed native aneurysm sac. Celiac: Patent without evidence of aneurysm, dissection, vasculitis or significant stenosis. SMA: Mild nonocclusive plaque. Classic distal branch anatomy. Renals: Duplicated left, inferior dominant with short-segment ostial stenosis. Single right, with partially calcified proximal plaque, no high-grade stenosis. IMA: Not opacified. Inflow: Both limbs of the aortobifem graft widely patent through the distal anastomoses. Interval long segment occlusion of right common iliac artery extending into internal and external iliac arteries, with distal reconstitution. Left common iliac is now occluded through its length, with collateral reconstitution of atheromatous nondilated external and internal iliac arteries. Patent but atheromatous proximal visualized lower extremity arterial outflow. Veins: No obvious venous abnormality within the limitations of this arterial phase study. Review of the MIP images confirms the above findings. NON-VASCULAR Hepatobiliary: Heterogenous parenchymal enhancement presumably related to early arterial phase timing of the scan, with limited portal vein enhancement. Gallbladder unremarkable. Pancreas: Unremarkable. No pancreatic ductal dilatation or surrounding inflammatory changes. Spleen: Heterogenous enhancement presumably related to early scan timing. Adrenals/Urinary Tract: Bilateral adrenal hypertrophy. No renal mass or hydronephrosis. Foley catheter decompresses urinary bladder. Stomach/Bowel: Orogastric tube extends into the stomach which is partially distended by gas and fluid. Fluid distention of the duodenum. Small bowel is  decompressed. Proximal colon is nondistended with a few scattered diverticula. Sigmoid colon is mildly distended proximally. Lymphatic: No definite adenopathy. Reproductive: Unremarkable Other: Small amount of pelvic and abdominal ascites. Left retroperitoneal poorly marginated fluid. A few bubbles of intraperitoneal gas presumably postop. Musculoskeletal: Interval midline anterior abdominal wall incision. Advanced degenerative disc disease L5-S1. No fracture or worrisome bone lesion. Review of the MIP images confirms the above findings. IMPRESSION: 1. Negative for acute PE or thoracic aortic dissection. 2. Interval infrarenal aortobifem graft repair, patent. 3. 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 4. Small amount of pelvic and abdominal ascites. 5. Small left and trace right pleural effusions. 6. Right middle and lower lobe consolidation/atelectasis with probable occlusive central bronchial secretions. 7. Coronary artery disease. Aortic Atherosclerosis (ICD10-I70.0). Electronically Signed   By: Lucrezia Europe M.D.   On: 11/09/2019 16:03    Anti-infectives: Anti-infectives (From admission, onward)   Start     Dose/Rate Route Frequency Ordered Stop   11/09/19 1900  cefTRIAXone (ROCEPHIN) 2 g in sodium chloride 0.9 % 100 mL  IVPB     2 g 200 mL/hr over 30 Minutes Intravenous Every 24 hours 11/09/19 1725     11/09/19 1900  metroNIDAZOLE (FLAGYL) IVPB 500 mg     500 mg 100 mL/hr over 60 Minutes Intravenous Every 8 hours 11/09/19 1725     11/09/19 0800  ceFAZolin (ANCEF) IVPB 2g/100 mL premix     2 g 200 mL/hr over 30 Minutes Intravenous Every 8 hours 11/26/2019 1545 11/09/19 1559   11/22/2019 0625  ceFAZolin (ANCEF) IVPB 2g/100 mL premix     2 g 200 mL/hr over 30 Minutes Intravenous 30 min pre-op 11/15/2019 7948 11/07/2019 0840       Assessment/Plan  vascular disease, 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 Carlis Abbott 5/5 VDRF DM H/O HTN, now hypotensive ARF - on CRRT Thrombocytopenia   POD 1, s/p left colectomy with discontinuity and Abthera VAC placement, Dr. Donne Hazel 5/5 -still on 2 pressors -will plan to return to OR tomorrow for hopeful colostomy and abdominal closure -cont NPO and nothing down his tubes as he is in discontinuity   FEN - NPO VTE - heparin ID - rocephin/flagyl   LOS: 3 days    Henreitta Cea , [facility] 11/11/2019, 8:21 AM Please see Amion for pager number during day hours 7:00am-4:30pm or 7:00am -11:30am on weekends

## 2019-11-11 NOTE — Progress Notes (Signed)
  Amiodarone Drug - Drug Interaction Consult Note  Recommendations: Amiodarone and Ondansetron can Prolong the QTc.  Amiodarone is metabolized by the cytochrome P450 system and therefore has the potential to cause many drug interactions. Amiodarone has an average plasma half-life of 50 days (range 20 to 100 days).   There is potential for drug interactions to occur several weeks or months after stopping treatment and the onset of drug interactions may be slow after initiating amiodarone.   []  Statins: Increased risk of myopathy. Simvastatin- restrict dose to 20mg  daily. Other statins: counsel patients to report any muscle pain or weakness immediately.  []  Anticoagulants: Amiodarone can increase anticoagulant effect. Consider warfarin dose reduction. Patients should be monitored closely and the dose of anticoagulant altered accordingly, remembering that amiodarone levels take several weeks to stabilize.  []  Antiepileptics: Amiodarone can increase plasma concentration of phenytoin, the dose should be reduced. Note that small changes in phenytoin dose can result in large changes in levels. Monitor patient and counsel on signs of toxicity.  []  Beta blockers: increased risk of bradycardia, AV block and myocardial depression. Sotalol - avoid concomitant use.  []   Calcium channel blockers (diltiazem and verapamil): increased risk of bradycardia, AV block and myocardial depression.  []   Cyclosporine: Amiodarone increases levels of cyclosporine. Reduced dose of cyclosporine is recommended.  []  Digoxin dose should be halved when amiodarone is started.  []  Diuretics: increased risk of cardiotoxicity if hypokalemia occurs.  []  Oral hypoglycemic agents (glyburide, glipizide, glimepiride): increased risk of hypoglycemia. Patient's glucose levels should be monitored closely when initiating amiodarone therapy.   [x]  Drugs that prolong the QT interval:  Torsades de pointes risk may be increased with  concurrent use - avoid if possible.  Monitor QTc, also keep magnesium/potassium WNL if concurrent therapy can't be avoided. Marland Kitchen Antibiotics: e.g. fluoroquinolones, erythromycin. . Antiarrhythmics: e.g. quinidine, procainamide, disopyramide, sotalol. . Antipsychotics: e.g. phenothiazines, haloperidol.  . Lithium, tricyclic antidepressants, and methadone.   Thank You,  Corinda Gubler  11/11/2019 4:38 PM

## 2019-11-11 NOTE — Progress Notes (Signed)
[facility] Progress Note Patient Name: Juan Bass DOB: 02-03-1951 MRN: [id number]   Date of Service  11/11/2019  HPI/Events of Note  ABG on 100%/PRVC 22/TV 560/P 10 = 7.32/46.4/93/26  eICU Interventions  Continue present ventilator management.      Intervention Category Major Interventions: Respiratory failure - evaluation and management  [name] 11/11/2019, 6:55 AM

## 2019-11-11 NOTE — Progress Notes (Signed)
PT Cancellation Note  Patient Details Name: [name] MRN: [id number] DOB: 1951/04/16   Cancelled Treatment:    Reason Eval/Treat Not Completed: Patient not medically ready(on vent with FiO2 80%)   Xachary Hambly B Vinaya Sancho 11/11/2019, 7:28 AM  [name], PT Acute Rehabilitation Services Pager: [phone] Office: [phone]

## 2019-11-11 NOTE — Progress Notes (Signed)
PHARMACY - TOTAL PARENTERAL NUTRITION CONSULT NOTE  Indication:  Bowel ischemia  Patient Measurements: Height: 5\' 9"  (175.3 cm) Weight: 79.6 kg (175 lb 7.8 oz) IBW/kg (Calculated) : 70.7 TPN AdjBW (KG): 75 Body mass index is 25.91 kg/m.  Assessment:   [name] presented on 11/09/2019 for AAA repair with aortobifemoral bypass.  Post-op course complicated by increased WOB, hypotension and cardiac arrest.  He was taken back to the OR on 12/03/2019 for left colectomy (left in discontinuity) and VAC placement for ischemic left colon, and reopening of recent laparotomy, RLE thrombectomy and 4 compartment fasciotomies.  Pharmacy consulted to manage TPN as no oral/enteral nutrition anticipated within the next several days.  Patient is intubated and sedated - unsure of nutritional status PTA.  Glucose / Insulin: hx DM on glipizide PTA - CBGs low normal, on SSI with no usage Electrolytes: K down to 4.5 (0K bath through CRRT), low iCa, Phos 7.8, Mag 2.6 Renal: ESRD, last HD session 5/3.  CRRT started 5/4.  SCr down 4.08, BUN 31 LFTs / TGs: AST/ALT 2371/816, tbili down to 1.7, TG WNL Prealbumin / albumin: albumin 2.7, amylase 258, prealbumin slightly low at 15.5 Intake / Output; MIVF: no UOP, NG O/P 16mL, drain 1171mL, D5NS at 75 ml/hr, net +6.4L GI Imaging: none since TPN consult Surgeries / Procedures: none since TPN consult  Central access: CVC double lumen placed 12/06/2019 TPN start date: 11/11/19  Nutritional Goals (RD rec on 5/5): 1875-2250 kCal, 130-150gm protein per day  Current Nutrition:  NPO  Plan:  Initiate concentrated TPN at 35 ml/hr (goal rate 70 ml/hr), providing 75g AA and 193g CHO for a total of 956 kCal, meeting ~50% of patient needs Hold ILE for the first 7 days of TPN in critically ill patients per ASPEN guidelines.  Add SMOFlipid on 11/15/19. Electrolytes in TPN: no lytes except standard Na and Ca, Cl:Ac 1:2 Daily multivitamin and trace elements in TPN.  No chromium with  ESRD. Continue moderate SSI Q6H Reduce D5NS to 40 ml/hr when TPN starts Standard TPN labs and nursing care orders Watch CBGs and increase CHO provision in TPN if needed  Juan Bass, PharmD, BCPS, [place] 11/11/2019, 7:34 AM

## 2019-11-11 NOTE — Progress Notes (Signed)
Inpatient Diabetes Program Recommendations  AACE/ADA: New Consensus Statement on Inpatient Glycemic Control (2015)  Target Ranges:  Prepandial:   less than 140 mg/dL      Peak postprandial:   less than 180 mg/dL (1-2 hours)      Critically ill patients:  140 - 180 mg/dL   Lab Results  Component Value Date   GLUCAP 106 (H) 11/11/2019   HGBA1C 6.7 (H) 11/15/2019    Review of Glycemic Control Results for STARR, ENGEL (MRN [id number]) as of 11/11/2019 10:51  Ref. Range 11/13/2019 16:06 12/03/2019 21:14 11/11/2019 23:54 11/11/2019 05:13 11/11/2019 06:49  Glucose-Capillary Latest Ref Range: 70 - 99 mg/dL 102 (H) 96 88 105 (H) 106 (H)   Diabetes history: DM 2 Outpatient Diabetes medications: Glucotrol 5 mg bid Current orders for Inpatient glycemic control:  Novolog moderate q 6 hours  Inpatient Diabetes Program Recommendations:   To start TPN tonight.  May consider reducing Novolog correction to Very sensitive (0-6 units) q 6 hours.   Thanks,  Adah Perl, RN, BC-ADM Inpatient Diabetes Coordinator Pager [phone] (8a-5p)

## 2019-11-11 NOTE — Plan of Care (Signed)
  Problem: Activity: Goal: Ability to tolerate increased activity will improve Outcome: Not Progressing   Problem: Respiratory: Goal: Ability to maintain a clear airway and adequate ventilation will improve Outcome: Progressing

## 2019-11-11 NOTE — Progress Notes (Signed)
OT Cancellation Note  Patient Details Name: Juan Bass MRN: [id number] DOB: 11-Jun-1951   Cancelled Treatment:    Reason Eval/Treat Not Completed: Patient not medically ready(Remains on vent with settings of FiO2 at 80%)  Zenovia Jarred, MSOT, OTR/L Acute Rehabilitation Services [facility] Office Number: [phone] Pager: [place] 11/11/2019, 9:49 AM

## 2019-11-11 NOTE — Progress Notes (Signed)
Chaplain engaged in follow-up visit with Mosi and his grandson.  His grandson shared that Brayan is the "backbone" of the family and described him as "strong." He also shared that they lost his grandma and father a couple years ago which makes family very important to them.  Chaplain commended grandson for the ways he has watched over [place].    Chaplain continues to offer support and will continue to follow-up.

## 2019-11-11 NOTE — Progress Notes (Addendum)
[facility] Progress Note    11/11/2019 7:10 AM 1 Day Post-Op  Subjective:  Intubated/sedated  Tm 99 now 96 HR 70's-90's NSR 76'H-607'P systolic 710% .62IRS8  Gtts:   Levophed Vasopressin Fentanyl IVF  Vitals:   11/11/19 0500 11/11/19 0600  BP: (!) 92/54 139/63  Pulse:    Resp: (!) 22 (!) 22  Temp: 97.7 F (36.5 C) (!) 97 F (36.1 C)  SpO2:      Physical Exam: Cardiac:  regular Lungs:  Intubated on .54OEV0  Abdomen:  Softer than yesterday with vac in place Incisions:  Bilateral groin incisions are clean and dry Extremities:  RLE with ischemic changes to foot and mottling to mid thigh; LLE with mottling.  Bilateral feet warmer than yesterday; brisk doppler signals left DP/PT/peroneal and brisk right PT and faint peroneal  CBC    Component Value Date/Time   WBC 5.8 11/11/2019 0221   RBC 3.73 (L) 11/11/2019 0221   HGB 9.5 (L) 11/11/2019 0650   HCT 28.0 (L) 11/11/2019 0650   PLT 74 (L) 11/11/2019 0221   MCV 85.0 11/11/2019 0221   MCH 27.3 11/11/2019 0221   MCHC 32.2 11/11/2019 0221   RDW 17.1 (H) 11/11/2019 0221   LYMPHSABS 0.4 (L) 11/11/2019 0221   MONOABS 0.2 11/11/2019 0221   EOSABS 0.0 11/11/2019 0221   BASOSABS 0.0 11/11/2019 0221    BMET    Component Value Date/Time   NA 140 11/11/2019 0650   K 4.5 11/11/2019 0650   CL 100 11/11/2019 0221   CO2 23 11/11/2019 0221   GLUCOSE 114 (H) 11/11/2019 0221   BUN 31 (H) 11/11/2019 0221   CREATININE 4.08 (H) 11/11/2019 0221   CALCIUM 6.8 (L) 11/11/2019 0221   GFRNONAA 14 (L) 11/11/2019 0221   GFRAA 16 (L) 11/11/2019 0221    INR    Component Value Date/Time   INR 1.7 (H) 11/11/2019 0221     Intake/Output Summary (Last 24 hours) at 11/11/2019 0710 Last data filed at 11/11/2019 0500 Gross per 24 hour  Intake 3869.23 ml  Output 3294 ml  Net 575.23 ml     Assessment/Plan:  69 y.o. male is s/p  Aortobifemoral bypass grafting 3 Days Post-Op  And Left colectomy/vac placement (general surgery) and  thrombectomy RLE with bovine patch angioplasty of right CFA with 4 compartment fasciotomies 1 Day Post-Op  -Vascular:  Pt with ischemic changes to BLE with right>left; at risk of amputation right but hopeful that mottling is due to pressor support and improve as these are weaned-will continue to watch.  Dressing removed from fasciotomy sites and muscle looks healthy.  Will attempt fasciotomy closure tomorrow in OR when he goes with general surgery.  Consent/npo order placed.    -Cardiac:  NSR; still requiring pressor support-wean as tolerated.  -Pulmonary:  Intubated on .35KKX3 down from 100%; respiratory acidosis improving -Neuro:  sedated -Renal:  ESRD-on CRRT; hyperkalemia from yesterday improved and K+ is 4.5 this am -GI:  Abdomen with wound vac in place; plan for OR tomorrow with general surgery; TPN to start later today.  Shock liver stabilizing as liver enzymes are starting to plateau -Incisions:  Bilateral groins clean and dry; laparotomy with wound vac in place.   -Heme/ID:  Acute surgical blood loss anemia-hgb stable Received 2 untis PRBC's yesterday and 3 PRBC's on 5/3, 4 units FFP yesterday and 2 units on 5/3, one platelet phoresis pack yesterday.  INR today is 1.7 down from 2.1 and hgb stable    Leontine Locket, PA-C  [facility] [phone] 11/11/2019 7:10 AM  I have seen and evaluated the patient. I agree with the PA note as documented above.  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  Postoperative course complicated by shock and multisystem organ failure and then developed ischemic right leg.  He was taken back to the OR and found to have ischemic left colon and we thrombectomized his right leg with fasciotomies. Neuro: GCS 3 T, on sedation Pulmonary: Intubated, FiO2 weaned to 80% and has gotten a fair amount of volume in the setting of ESRD, rate increased overnight on vent for respiratory acidosis CV: Hemoglobin stable,  got several units of packed red blood cells and 4 units of FFP yesterday for an INR of 2.1.  INR is improved.  No signs of bleeding. GI: Patient is in discontinuity, vac to abdomen, NG to suction.  Return to or tomorrow with general surgery for washout colostomy and possible closure. Renal: ESRD and appreciate nephrology input running even on CRRT. ID: continue ceftriaxone and Flagyl Appreciate critical care input Right leg with brisk posterior tibial and peroneal signals after thrombectomy.  I had to remove the staples from his fasciotomy incisions yesterday evening and calf is fairly edematous. Muscle looks ok today.  We will attempt washout and closure tomorrow when he is in the OR with general surgery.  Marty Heck, MD [facility] [place] Office: [place]

## 2019-11-11 NOTE — Progress Notes (Signed)
[place] Progress Note Patient Name: Juan Bass DOB: 1950-08-01 MRN: [id number]   Date of Service  11/11/2019  HPI/Events of Note  ABG on 100%/PRVC 18/TV 560/P 10 = 7.285/56.5/162.   eICU Interventions  Will order: 1. Increase PRVC rate to 22. 2. Repeat ABG at 5 AM.      Intervention Category Major Interventions: Acid-Base disturbance - evaluation and management;Respiratory failure - evaluation and management  [name] 11/11/2019, 2:51 AM

## 2019-11-12 ENCOUNTER — Inpatient Hospital Stay (HOSPITAL_COMMUNITY): Payer: PPO | Admitting: Certified Registered Nurse Anesthetist

## 2019-11-12 ENCOUNTER — Encounter (HOSPITAL_COMMUNITY): Admission: RE | Disposition: E | Payer: Self-pay | Source: Home / Self Care | Attending: Vascular Surgery

## 2019-11-12 HISTORY — PX: APPLICATION OF WOUND VAC: SHX5189

## 2019-11-12 HISTORY — PX: COLOSTOMY: SHX63

## 2019-11-12 HISTORY — PX: FASCIOTOMY CLOSURE: SHX5829

## 2019-11-12 HISTORY — PX: LAPAROTOMY: SHX154

## 2019-11-12 HISTORY — PX: I & D EXTREMITY: SHX5045

## 2019-11-12 HISTORY — PX: BOWEL RESECTION: SHX1257

## 2019-11-12 LAB — POCT I-STAT 7, (LYTES, BLD GAS, ICA,H+H)
Acid-Base Excess: 0 mmol/L (ref 0.0–2.0)
Acid-Base Excess: 0 mmol/L (ref 0.0–2.0)
Bicarbonate: 25.7 mmol/L (ref 20.0–28.0)
Bicarbonate: 24.9 mmol/L (ref 20.0–28.0)
Calcium, Ion: 1.08 mmol/L — ABNORMAL LOW (ref 1.15–1.40)
Calcium, Ion: 1.1 mmol/L — ABNORMAL LOW (ref 1.15–1.40)
HCT: 24 % — ABNORMAL LOW (ref 39.0–52.0)
HCT: 24 % — ABNORMAL LOW (ref 39.0–52.0)
Hemoglobin: 8.2 g/dL — ABNORMAL LOW (ref 13.0–17.0)
Hemoglobin: 8.2 g/dL — ABNORMAL LOW (ref 13.0–17.0)
O2 Saturation: 98 %
O2 Saturation: 99 %
Patient temperature: 36.4
Patient temperature: 36.5
Potassium: 4 mmol/L (ref 3.5–5.1)
Potassium: 3.5 mmol/L (ref 3.5–5.1)
Sodium: 138 mmol/L (ref 135–145)
Sodium: 138 mmol/L (ref 135–145)
TCO2: 27 mmol/L (ref 22–32)
TCO2: 26 mmol/L (ref 22–32)
pCO2 arterial: 46.2 mmHg (ref 32.0–48.0)
pCO2 arterial: 41.6 mmHg (ref 32.0–48.0)
pH, Arterial: 7.35 (ref 7.350–7.450)
pH, Arterial: 7.383 (ref 7.350–7.450)
pO2, Arterial: 100 mmHg (ref 83.0–108.0)
pO2, Arterial: 120 mmHg — ABNORMAL HIGH (ref 83.0–108.0)

## 2019-11-12 LAB — CBC
HCT: 27.2 % — ABNORMAL LOW (ref 39.0–52.0)
Hemoglobin: 8.9 g/dL — ABNORMAL LOW (ref 13.0–17.0)
MCH: 27.7 pg (ref 26.0–34.0)
MCHC: 32.7 g/dL (ref 30.0–36.0)
MCV: 84.7 fL (ref 80.0–100.0)
Platelets: 53 10*3/uL — ABNORMAL LOW (ref 150–400)
RBC: 3.21 MIL/uL — ABNORMAL LOW (ref 4.22–5.81)
RDW: 17.4 % — ABNORMAL HIGH (ref 11.5–15.5)
WBC: 5.6 10*3/uL (ref 4.0–10.5)
nRBC: 2.1 % — ABNORMAL HIGH (ref 0.0–0.2)

## 2019-11-12 LAB — POCT I-STAT, CHEM 8
BUN: 31 mg/dL — ABNORMAL HIGH (ref 8–23)
Calcium, Ion: 1.08 mmol/L — ABNORMAL LOW (ref 1.15–1.40)
Chloride: 99 mmol/L (ref 98–111)
Creatinine, Ser: 3.5 mg/dL — ABNORMAL HIGH (ref 0.61–1.24)
Glucose, Bld: 243 mg/dL — ABNORMAL HIGH (ref 70–99)
HCT: 25 % — ABNORMAL LOW (ref 39.0–52.0)
Hemoglobin: 8.5 g/dL — ABNORMAL LOW (ref 13.0–17.0)
Potassium: 4.1 mmol/L (ref 3.5–5.1)
Sodium: 137 mmol/L (ref 135–145)
TCO2: 26 mmol/L (ref 22–32)

## 2019-11-12 LAB — RENAL FUNCTION PANEL
Albumin: 2.6 g/dL — ABNORMAL LOW (ref 3.5–5.0)
Albumin: 2.6 g/dL — ABNORMAL LOW (ref 3.5–5.0)
Anion gap: 11 (ref 5–15)
Anion gap: 12 (ref 5–15)
BUN: 28 mg/dL — ABNORMAL HIGH (ref 8–23)
BUN: 32 mg/dL — ABNORMAL HIGH (ref 8–23)
CO2: 25 mmol/L (ref 22–32)
CO2: 23 mmol/L (ref 22–32)
Calcium: 7.3 mg/dL — ABNORMAL LOW (ref 8.9–10.3)
Calcium: 8.1 mg/dL — ABNORMAL LOW (ref 8.9–10.3)
Chloride: 102 mmol/L (ref 98–111)
Chloride: 101 mmol/L (ref 98–111)
Creatinine, Ser: 3.27 mg/dL — ABNORMAL HIGH (ref 0.61–1.24)
Creatinine, Ser: 3.18 mg/dL — ABNORMAL HIGH (ref 0.61–1.24)
GFR calc Af Amer: 21 mL/min — ABNORMAL LOW (ref >60)
GFR calc Af Amer: 22 mL/min — ABNORMAL LOW (ref >60)
GFR calc non Af Amer: 19 mL/min — ABNORMAL LOW (ref >60)
GFR calc non Af Amer: 19 mL/min — ABNORMAL LOW (ref >60)
Glucose, Bld: 236 mg/dL — ABNORMAL HIGH (ref 70–99)
Glucose, Bld: 165 mg/dL — ABNORMAL HIGH (ref 70–99)
Phosphorus: 4.8 mg/dL — ABNORMAL HIGH (ref 2.5–4.6)
Phosphorus: 4.7 mg/dL — ABNORMAL HIGH (ref 2.5–4.6)
Potassium: 4.3 mmol/L (ref 3.5–5.1)
Potassium: 3.9 mmol/L (ref 3.5–5.1)
Sodium: 138 mmol/L (ref 135–145)
Sodium: 136 mmol/L (ref 135–145)

## 2019-11-12 LAB — SURGICAL PATHOLOGY

## 2019-11-12 LAB — GLUCOSE, CAPILLARY
Glucose-Capillary: 163 mg/dL — ABNORMAL HIGH (ref 70–99)
Glucose-Capillary: 213 mg/dL — ABNORMAL HIGH (ref 70–99)
Glucose-Capillary: 217 mg/dL — ABNORMAL HIGH (ref 70–99)
Glucose-Capillary: 229 mg/dL — ABNORMAL HIGH (ref 70–99)
Glucose-Capillary: 77 mg/dL (ref 70–99)

## 2019-11-12 LAB — MAGNESIUM: Magnesium: 2.5 mg/dL — ABNORMAL HIGH (ref 1.7–2.4)

## 2019-11-12 LAB — PREPARE RBC (CROSSMATCH)

## 2019-11-12 SURGERY — LAPAROTOMY, EXPLORATORY
Anesthesia: General | Site: Leg Lower | Laterality: Right

## 2019-11-12 MED ORDER — DEXMEDETOMIDINE HCL IN NACL 400 MCG/100ML IV SOLN
0.4000 ug/kg/h | INTRAVENOUS | Status: DC
  Filled 2019-11-12: qty 100

## 2019-11-12 MED ORDER — MIDAZOLAM HCL 2 MG/2ML IJ SOLN
1.0000 mg | INTRAMUSCULAR | Status: DC | PRN
  Administered 2019-11-13 – 2019-11-14 (×2): 1 mg via INTRAVENOUS
  Filled 2019-11-12: qty 2

## 2019-11-12 MED ORDER — TRACE MINERALS CU-MN-SE-ZN 300-55-60-3000 MCG/ML IV SOLN
INTRAVENOUS | Status: AC
  Filled 2019-11-12: qty 498.4

## 2019-11-12 MED ORDER — ROCURONIUM BROMIDE 10 MG/ML (PF) SYRINGE
PREFILLED_SYRINGE | INTRAVENOUS | Status: DC | PRN
  Administered 2019-11-12: 50 mg via INTRAVENOUS

## 2019-11-12 MED ORDER — CALCIUM CHLORIDE 10 % IV SOLN
INTRAVENOUS | Status: AC
  Filled 2019-11-12: qty 10

## 2019-11-12 MED ORDER — MIDAZOLAM HCL 2 MG/2ML IJ SOLN
1.0000 mg | INTRAMUSCULAR | Status: DC | PRN
  Administered 2019-11-12 – 2019-11-16 (×7): 1 mg via INTRAVENOUS
  Filled 2019-11-12 (×6): qty 2

## 2019-11-12 MED ORDER — INSULIN ASPART 100 UNIT/ML ~~LOC~~ SOLN: 0.0000 [IU] | SUBCUTANEOUS | Status: DC

## 2019-11-12 MED ORDER — LACTATED RINGERS IV SOLN: INTRAVENOUS | Status: DC | PRN

## 2019-11-12 MED ORDER — SODIUM CHLORIDE 0.9 % IV SOLN: INTRAVENOUS | Status: DC | PRN

## 2019-11-12 MED ORDER — FENTANYL CITRATE (PF) 250 MCG/5ML IJ SOLN
INTRAMUSCULAR | Status: DC | PRN
  Administered 2019-11-12: 100 ug via INTRAVENOUS
  Administered 2019-11-12: 50 ug via INTRAVENOUS
  Administered 2019-11-12: 100 ug via INTRAVENOUS

## 2019-11-12 MED ORDER — CALCIUM GLUCONATE-NACL 1-0.675 GM/50ML-% IV SOLN: 1.0000 g | Freq: Once | INTRAVENOUS | Status: DC

## 2019-11-12 MED ORDER — MIDAZOLAM HCL 2 MG/2ML IJ SOLN
INTRAMUSCULAR | Status: AC
  Filled 2019-11-12: qty 2

## 2019-11-12 MED ORDER — INSULIN ASPART 100 UNIT/ML ~~LOC~~ SOLN
0.0000 [IU] | SUBCUTANEOUS | Status: DC
  Administered 2019-11-12: 8 [IU] via SUBCUTANEOUS
  Administered 2019-11-12 – 2019-11-13 (×4): 3 [IU] via SUBCUTANEOUS
  Administered 2019-11-13 – 2019-11-14 (×3): 5 [IU] via SUBCUTANEOUS

## 2019-11-12 MED ORDER — PROPOFOL 10 MG/ML IV BOLUS
INTRAVENOUS | Status: AC
  Filled 2019-11-12: qty 40

## 2019-11-12 MED ORDER — ALBUMIN HUMAN 5 % IV SOLN: INTRAVENOUS | Status: DC | PRN

## 2019-11-12 MED ORDER — 0.9 % SODIUM CHLORIDE (POUR BTL) OPTIME
TOPICAL | Status: DC | PRN
  Administered 2019-11-12: 4000 mL

## 2019-11-12 MED ORDER — FENTANYL CITRATE (PF) 250 MCG/5ML IJ SOLN
INTRAMUSCULAR | Status: AC
  Filled 2019-11-12: qty 5

## 2019-11-12 MED ORDER — ROCURONIUM BROMIDE 10 MG/ML (PF) SYRINGE
PREFILLED_SYRINGE | INTRAVENOUS | Status: AC
  Filled 2019-11-12: qty 10

## 2019-11-12 MED ORDER — CALCIUM CHLORIDE 10 % IV SOLN
INTRAVENOUS | Status: DC | PRN
  Administered 2019-11-12: 1 g via INTRAVENOUS

## 2019-11-12 SURGICAL SUPPLY — 82 items
ADH SKN CLS APL DERMABOND .7 (GAUZE/BANDAGES/DRESSINGS) ×3
APL PRP STRL LF DISP 70% ISPRP (MISCELLANEOUS) ×3
BLADE CLIPPER SURG (BLADE) IMPLANT
BNDG ELASTIC 4X5.8 VLCR STR LF (GAUZE/BANDAGES/DRESSINGS) ×2 IMPLANT
BNDG ELASTIC 6X5.8 VLCR STR LF (GAUZE/BANDAGES/DRESSINGS) IMPLANT
BNDG GAUZE ELAST 4 BULKY (GAUZE/BANDAGES/DRESSINGS) ×2 IMPLANT
CANISTER SUCT 3000ML PPV (MISCELLANEOUS) ×10 IMPLANT
CANISTER WOUND CARE 500ML ATS (WOUND CARE) ×4 IMPLANT
CHLORAPREP W/TINT 26 (MISCELLANEOUS) ×5 IMPLANT
COVER SURGICAL LIGHT HANDLE (MISCELLANEOUS) ×15 IMPLANT
COVER WAND RF STERILE (DRAPES) ×10 IMPLANT
DERMABOND ADVANCED (GAUZE/BANDAGES/DRESSINGS) ×2
DERMABOND ADVANCED .7 DNX12 (GAUZE/BANDAGES/DRESSINGS) ×3 IMPLANT
DRAPE INCISE IOBAN 66X45 STRL (DRAPES) ×2 IMPLANT
DRAPE LAPAROSCOPIC ABDOMINAL (DRAPES) ×5 IMPLANT
DRAPE ORTHO SPLIT 77X108 STRL (DRAPES)
DRAPE SURG ORHT 6 SPLT 77X108 (DRAPES) IMPLANT
DRAPE WARM FLUID 44X44 (DRAPES) ×5 IMPLANT
DRSG OPSITE POSTOP 4X10 (GAUZE/BANDAGES/DRESSINGS) IMPLANT
DRSG OPSITE POSTOP 4X8 (GAUZE/BANDAGES/DRESSINGS) IMPLANT
DRSG VAC ATS LRG SENSATRAC (GAUZE/BANDAGES/DRESSINGS) ×4 IMPLANT
DURAPREP 26ML APPLICATOR (WOUND CARE) ×2 IMPLANT
ELECT BLADE 6.5 EXT (BLADE) IMPLANT
ELECT CAUTERY BLADE 6.4 (BLADE) IMPLANT
ELECT REM PT RETURN 9FT ADLT (ELECTROSURGICAL) ×10
ELECTRODE REM PT RTRN 9FT ADLT (ELECTROSURGICAL) ×6 IMPLANT
EVACUATOR SILICONE 100CC (DRAIN) ×2 IMPLANT
GAUZE SPONGE 4X4 12PLY STRL (GAUZE/BANDAGES/DRESSINGS) ×7 IMPLANT
GLOVE BIO SURGEON STRL SZ7 (GLOVE) ×5 IMPLANT
GLOVE BIO SURGEON STRL SZ7.5 (GLOVE) ×5 IMPLANT
GLOVE BIOGEL PI IND STRL 7.5 (GLOVE) ×3 IMPLANT
GLOVE BIOGEL PI IND STRL 8 (GLOVE) ×3 IMPLANT
GLOVE BIOGEL PI INDICATOR 7.5 (GLOVE) ×2
GLOVE BIOGEL PI INDICATOR 8 (GLOVE) ×2
GOWN STRL REUS W/ TWL LRG LVL3 (GOWN DISPOSABLE) ×15 IMPLANT
GOWN STRL REUS W/ TWL XL LVL3 (GOWN DISPOSABLE) ×6 IMPLANT
GOWN STRL REUS W/TWL LRG LVL3 (GOWN DISPOSABLE) ×25
GOWN STRL REUS W/TWL XL LVL3 (GOWN DISPOSABLE) ×10
HANDLE SUCTION POOLE (INSTRUMENTS) ×3 IMPLANT
KIT BASIN OR (CUSTOM PROCEDURE TRAY) ×10 IMPLANT
KIT OSTOMY DRAINABLE 2.75 STR (WOUND CARE) ×2 IMPLANT
KIT TURNOVER KIT B (KITS) ×10 IMPLANT
LIGASURE IMPACT 36 18CM CVD LR (INSTRUMENTS) ×2 IMPLANT
NS IRRIG 1000ML POUR BTL (IV SOLUTION) ×15 IMPLANT
PACK CV ACCESS (CUSTOM PROCEDURE TRAY) ×5 IMPLANT
PACK GENERAL/GYN (CUSTOM PROCEDURE TRAY) ×10 IMPLANT
PACK UNIVERSAL I (CUSTOM PROCEDURE TRAY) ×5 IMPLANT
PAD ARMBOARD 7.5X6 YLW CONV (MISCELLANEOUS) ×15 IMPLANT
PENCIL SMOKE EVACUATOR (MISCELLANEOUS) ×5 IMPLANT
PREVENA RESTOR ARTHOFORM 33X30 (CANNISTER) ×2 IMPLANT
RELOAD PROXIMATE 75MM BLUE (ENDOMECHANICALS) ×10 IMPLANT
RELOAD STAPLE 75 3.8 BLU REG (ENDOMECHANICALS) IMPLANT
SPECIMEN JAR LARGE (MISCELLANEOUS) IMPLANT
SPONGE LAP 18X18 RF (DISPOSABLE) IMPLANT
STAPLER CUT CVD 40MM BLUE (STAPLE) ×2 IMPLANT
STAPLER GUN LINEAR PROX 60 (STAPLE) ×2 IMPLANT
STAPLER PROXIMATE 75MM BLUE (STAPLE) ×2 IMPLANT
STAPLER VISISTAT 35W (STAPLE) ×5 IMPLANT
SUCTION POOLE HANDLE (INSTRUMENTS) ×5
SUT ETHILON 2 0 FS 18 (SUTURE) ×2 IMPLANT
SUT ETHILON 2 0 PSLX (SUTURE) ×6 IMPLANT
SUT ETHILON 3 0 PS 1 (SUTURE) ×2 IMPLANT
SUT MNCRL AB 4-0 PS2 18 (SUTURE) IMPLANT
SUT NOVA 1 T20/GS 25DT (SUTURE) ×2 IMPLANT
SUT PDS AB 1 TP1 96 (SUTURE) ×10 IMPLANT
SUT SILK 2 0 (SUTURE) ×5
SUT SILK 2 0 SH CR/8 (SUTURE) ×5 IMPLANT
SUT SILK 2-0 18XBRD TIE 12 (SUTURE) ×3 IMPLANT
SUT SILK 3 0 (SUTURE) ×5
SUT SILK 3 0 SH CR/8 (SUTURE) ×5 IMPLANT
SUT SILK 3-0 18XBRD TIE 12 (SUTURE) ×3 IMPLANT
SUT VIC AB 2-0 CT1 27 (SUTURE)
SUT VIC AB 2-0 CT1 TAPERPNT 27 (SUTURE) IMPLANT
SUT VIC AB 2-0 CTX 36 (SUTURE) IMPLANT
SUT VIC AB 2-0 SH 18 (SUTURE) ×4 IMPLANT
SUT VIC AB 3-0 SH 18 (SUTURE) ×2 IMPLANT
SUT VIC AB 3-0 SH 27 (SUTURE)
SUT VIC AB 3-0 SH 27X BRD (SUTURE) IMPLANT
TOWEL GREEN STERILE (TOWEL DISPOSABLE) ×10 IMPLANT
TRAY FOLEY MTR SLVR 16FR STAT (SET/KITS/TRAYS/PACK) ×5 IMPLANT
WATER STERILE IRR 1000ML POUR (IV SOLUTION) ×5 IMPLANT
YANKAUER SUCT BULB TIP NO VENT (SUCTIONS) IMPLANT

## 2019-11-12 NOTE — Op Note (Signed)
Date: Nov 12, 2019  Preoperative diagnosis: Right lower extremity fasciotomies status post ischemic right leg after open juxtarenal abdominal aortic aneurysm repair  Postoperative diagnosis: Same  Procedure: 1.  Washout and closure of right lower extremity lateral fasciotomy incision 2.  Washout and partial closure of right lower extremity medial fasciotomy incision with negative pressure wound VAC placement  Surgeon: Dr. Marty Heck, MD  Assistant: OR staff  Indication: 69 year old male underwent a open juxtarenal abdominal aortic aneurysm repair on Monday.  Postoperatively he became critically ill with ongoing shock and was taken back for abdominal exploration and found to have ischemic left colon as well as an ischemic right leg.  Ultimately right lower extremity fasciotomies were performed.  He presents today for washout and closure of his fasciotomies at the same time general surgery will attempt to close his abdomen.  Findings: Lateral fasciotomy was closed with 2-0 nylon in horizontal mattress.  Muscle in the anterior lateral compartments was marginal.  The medial incision was not closable and only several interrupted sutures were placed at the each end of the incision.  Muscles in the deep posterior compartment look much more healthy than muscle in the superficial compartment that was more marginal appearing.  Negative pressure wound VAC was applied to the medial fasciotomy.  He had good posterior tibial signal at completion.  Details: Patient was taken to the operating room from the ICU where he was already intubated.  He was placed on the OR table in supine position.  The right lower extremity dressings were removed.  The right leg was then prepped and draped in the usual sterile fashion.  Initially started with lateral fasciotomy and sterile saline was used to washout both the anterior and lateral compartments.  Muscle appeared marginal.  2-0 nylon sutures were used to close the  lateral fasciotomy incision in horizontal mattress format.  The lateral incision was completely closed.  Then turned to the medial incision where there was significant edema in the muscle.  The muscle in the posterior deep compartment looked much more viable than in the superficial compartment.  The wound was copiously irrigated.  I put some 2-0 nylons on each end of the fasciotomy incision for partial closure but most of this was not closable.  A large VAC was then cut and applied with [place] and a negative track pad was applied.  He had a good posterior tibial signal in the right foot at completion.  Complications: None  Condition: Critical  Marty Heck, MD [facility] [place] Office: [place]

## 2019-11-12 NOTE — Progress Notes (Signed)
OT Cancellation Note  Patient Details Name: Juan Bass MRN: [id number] DOB: Nov 30, 1950   Cancelled Treatment:    Reason Eval/Treat Not Completed: Patient not medically ready(Pt remains intubated and sedated with more planned sxs the next few dates. OT will sign off until pt medically able to participate in [place].)  Zenovia Jarred, [place], OTR/L [facility] Office Number: [phone] Pager: [place] 11/26/2019, 3:38 PM

## 2019-11-12 NOTE — Progress Notes (Signed)
Pt to OR for surgery

## 2019-11-12 NOTE — Procedures (Signed)
Arterial Catheter Insertion Procedure Note ZAYIN VALADEZ [id number] [birth year]  Procedure: Insertion of Arterial Catheter  Indications: Blood pressure monitoring and Frequent blood sampling  Procedure Details Consent: Unable to obtain consent because of altered level of consciousness. Time Out: Verified patient identification, verified procedure, site/side was marked, verified correct patient position, special equipment/implants available, medications/allergies/relevent history reviewed, required imaging and test results available.  Performed  Maximum sterile technique was used including antiseptics, cap, gloves, gown, hand hygiene, mask and sheet. Skin prep: Chlorhexidine; local anesthetic administered 20 gauge catheter was inserted into right radial artery using the Seldinger technique. ULTRASOUND GUIDANCE USED: NO Evaluation Blood flow good; BP tracing good. Complications: No apparent complications.   Dimple Nanas 11/20/2019

## 2019-11-12 NOTE — Anesthesia Preprocedure Evaluation (Signed)
Anesthesia Evaluation  Patient identified by MRN, date of birth, ID band Patient unresponsive    Reviewed: Allergy & Precautions, H&P , NPO status , Patient's Chart, lab work & pertinent test results, Unable to perform ROS - Chart review onlyPreop documentation limited or incomplete due to emergent nature of procedure.  History of Anesthesia Complications Negative for: history of anesthetic complications  Airway Mallampati: Intubated       Dental  (+) Teeth Intact, Dental Advisory Given   Pulmonary Current Smoker and Patient abstained from smoking.,  Vent dependent   breath sounds clear to auscultation       Cardiovascular hypertension, + Peripheral Vascular Disease   Rhythm:Regular     Neuro/Psych negative neurological ROS  negative psych ROS   GI/Hepatic negative GI ROS, Neg liver ROS,   Endo/Other  diabetesHypothyroidism   Renal/GU ESRF and DialysisRenal disease     Musculoskeletal   Abdominal   Peds  Hematology  (+) Blood dyscrasia, anemia ,   Anesthesia Other Findings   Reproductive/Obstetrics                             Anesthesia Physical Anesthesia Plan  ASA: IV  Anesthesia Plan: General   Post-op Pain Management:    Induction:   PONV Risk Score and Plan: 1 and Treatment may vary due to age or medical condition  Airway Management Planned: Oral ETT  Additional Equipment: Arterial line  Intra-op Plan:   Post-operative Plan: Post-operative intubation/ventilation  Informed Consent:     History available from chart only and Only emergency history available  Plan Discussed with: CRNA and Surgeon  Anesthesia Plan Comments:         Anesthesia Quick Evaluation

## 2019-11-12 NOTE — Progress Notes (Signed)
PT Cancellation Note  Patient Details Name: Juan Bass MRN: [id number] DOB: 1951/05/26   Cancelled Treatment:    Reason Eval/Treat Not Completed: Patient not medically ready(will sign off and await new order as medically cleared for mobility)   [name] [name] 11/22/2019, 6:38 AM  [name], PT Acute [facility] Services Pager: [phone] Office: [phone]

## 2019-11-12 NOTE — Op Note (Addendum)
Preoperative diagnosis: Open abdomen status post left colectomy for ischemic: After aneurysm repair Postoperative diagnosis: Same as above Procedure: 1.  Reopening of recent laparotomy 2.  Small bowel resection with primary anastomosis 3.  Resection of additional rectum 4.  Abdominal negative pressure dressing placement 5. End transverse colostomy Surgeon: Dr. Serita Grammes Assistant: Saverio Danker, PA-C Estimated blood loss: 75 cc Anesthesia: General Specimens: Small bowel to pathology Drains: 19 French Blake drain to pelvis Complications: None Sponge needle count was correct at completion Dispo to ICU in critical condition  Indications: This is 69 year old male who underwent an aortic aneurysm repair on Monday.  This was complicated by an ischemic left colon.  I operated on him on Wednesday and removed his colon and left him in discontinuity.  He is returned to the operating room today.  Procedure: After informed consent was obtained from his son he was taken to the operating room.  He was already on antibiotics.  SCDs were in place on the other side.  Dr. Carlis Abbott did his portion of the procedure first.  He was then prepped and draped in the standard sterile surgical fashion after removing the abdominal negative pressure dressing.  Surgical timeout was then performed.  I evaluated his abdomen again.  His colon was all viable.  There was a small portion of his rectum that was ischemic.  His small bowel was run and there were numerous areas that were hemorrhagic.  There was one frankly ischemic area.  I first used a contour stapler to remove a small portion of the rectum.  This is appears to be viable at this point.  I then oversewed his rectum with 2-0 Vicryl sutures.  I then placed a 7 [place] Blake drain in his pelvis.  This was secured with a 2-0 nylon suture.  I then removed a portion of his ileum.  I used a GIA stapler to divide this proximal and distal.  I used a LigaSure to divide the  mesentery.  I then created a anastomosis with the GIA stapler to create a common channel.  The TX stapler was used to close this.  I then sutured the mesenteric defect closed with 2-0 Vicryl suture.  I obtained hemostasis with 3-0 Vicryl suture as well.  This was patent.  I then located his transverse colon.  This was all viable.  I chose a spot on his left upper quadrant away from his ribs and made a hole in his skin and took this to the fascia.  A cruciate incision was made in the fascia.  I entered to the peritoneum and dilated this with my fingers.  I then cleaned off the end of his colon of omentum using the LigaSure device and cautery.  I then brought this through the defect.  This was without tension.  I then placed an abdominal negative pressure dressing.  Once this was functional we then matured the colostomy with 3-0 Vicryl suture.  He tolerated this and was transferred back to the ICU.

## 2019-11-12 NOTE — Transfer of Care (Signed)
Immediate Anesthesia Transfer of Care Note  Patient: Juan Bass  Procedure(s) Performed: REOPENING LAPAROTOMY (N/A ) COLOSTOMY WITH ABDOMINAL CLOSURE (N/A ) Small Bowel Resection with Rectal Resection (N/A Abdomen) Application Of Wound Vac (N/A Abdomen) IRRIGATION AND DEBRIDEMENT OF RIGHT LEG (Right Leg Lower) FASCIOTOMY CLOSURE RIGHT LEG (Right Leg Lower) Application Of Wound Vac RIGHT LEG (Right Leg Lower)  Patient Location: ICU  Anesthesia Type:General  Level of Consciousness: Patient remains intubated per anesthesia plan  Airway & Oxygen Therapy: Patient remains intubated per anesthesia plan and Patient placed on Ventilator (see vital sign flow sheet for setting)  Post-op Assessment: Report given to RN and Post -op Vital signs reviewed and stable  Post vital signs: Reviewed and stable  Last Vitals:  Vitals Value Taken Time  BP    Temp    Pulse    Resp    SpO2      Last Pain:  Vitals:   12/05/2019 0400  TempSrc: Esophageal  PainSc:       Patients Stated Pain Goal: 2 (58/31/67 4255)  Complications: No apparent anesthesia complications

## 2019-11-12 NOTE — Progress Notes (Addendum)
Pain:  Patient reported pain at 8/10.  At this time, gave 0.5 mg of dilaudid via IV as per orders.  Error made by writer and dilaudid was not scanned but was wasted in pyxis by verified by June, RN and was witnessed being given by Kennon Rounds who was the dialysis RN.   Patient was reassessed for pain and pain reported pain was tolerable when patient was awoken.  Pain reassessment was charted.

## 2019-11-12 NOTE — Anesthesia Procedure Notes (Deleted)
Performed by: Trinna Post., CRNA

## 2019-11-12 NOTE — Progress Notes (Signed)
2 Days Post-Op   Subjective/Chief Complaint: Levo/vaso, afib on amio, sedated on vent   Objective: Vital signs in last 24 hours: Temp:  [95.9 F (35.5 C)-98.5 F (36.9 C)] 97 F (36.1 C) (05/07 0515) Pulse Rate:  [81-129] 111 (05/07 0515) Resp:  [11-23] 20 (05/07 0515) BP: (90-161)/(42-83) 104/73 (05/07 0500) SpO2:  [28 %-100 %] 100 % (05/07 0515) Arterial Line BP: (81-167)/(41-75) 148/61 (05/07 0515) FiO2 (%):  [50 %-80 %] 50 % (05/07 0400) Weight:  [79 kg] 79 kg (05/07 0500) Last BM Date: 11/17/2019  Intake/Output from previous day: 05/06 0701 - 05/07 0700 In: 2522.7 [I.V.:2030.4; IV Piggyback:492.3] Out: 2286 [Emesis/NG output:200; Drains:725] Intake/Output this shift: Total I/O In: 1286.2 [I.V.:986.1; IV Piggyback:300.1] Out: 1162 [Emesis/NG output:100; Drains:350; Other:712]  GI: vac functional  Lab Results:  Recent Labs    11/11/19 0221 11/11/19 0237 11/11/19 0650 11/24/2019 0340  WBC 5.8  --   --  5.6  HGB 10.2*   < > 9.5* 8.9*  HCT 31.7*   < > 28.0* 27.2*  PLT 74*  --   --  53*   < > = values in this interval not displayed.   BMET Recent Labs    11/11/19 1500 11/11/19 1500 11/11/19 2110 11/23/2019 0340  NA 136  --   --  138  K 4.4   < > 4.7 4.3  CL 102  --   --  102  CO2 22  --   --  25  GLUCOSE 112*  --   --  236*  BUN 27*  --   --  28*  CREATININE 3.74*  --   --  3.27*  CALCIUM 6.8*  --   --  7.3*   < > = values in this interval not displayed.   PT/INR Recent Labs    11/11/2019 1240 11/11/19 0221  LABPROT 22.9* 19.5*  INR 2.1* 1.7*   ABG Recent Labs    11/11/19 0237 11/11/19 0650  PHART 7.285* 7.324*  HCO3 26.8 24.4    Studies/Results: DG CHEST PORT 1 VIEW  Result Date: 11/11/2019 CLINICAL DATA:  Intubation. EXAM: PORTABLE CHEST 1 VIEW COMPARISON:  12/04/2019. FINDINGS: Endotracheal tube, NG tube, right IJ sheath, left subclavian line in stable position. Heart size stable. Bibasilar infiltrates/edema noted on today's exam. Bibasilar  atelectasis. Small right pleural effusion. No pneumothorax. Degenerative change thoracic spine. IMPRESSION: 1. Bibasilar infiltrates/edema noted on today's exam. Bibasilar atelectasis. Small right pleural effusion. 2.  Lines and tubes in stable position. Electronically Signed   By: Marcello Moores  Register   On: 11/11/2019 08:10    Anti-infectives: Anti-infectives (From admission, onward)   Start     Dose/Rate Route Frequency Ordered Stop   11/09/19 1900  cefTRIAXone (ROCEPHIN) 2 g in sodium chloride 0.9 % 100 mL IVPB     2 g 200 mL/hr over 30 Minutes Intravenous Every 24 hours 11/09/19 1725     11/09/19 1900  metroNIDAZOLE (FLAGYL) IVPB 500 mg     500 mg 100 mL/hr over 60 Minutes Intravenous Every 8 hours 11/09/19 1725     11/09/19 0800  ceFAZolin (ANCEF) IVPB 2g/100 mL premix     2 g 200 mL/hr over 30 Minutes Intravenous Every 8 hours 11/28/2019 1545 11/09/19 1559   11/24/2019 0625  ceFAZolin (ANCEF) IVPB 2g/100 mL premix     2 g 200 mL/hr over 30 Minutes Intravenous 30 min pre-op 11/09/2019 0625 11/30/2019 0840      Assessment/Plan: POD 2, s/p left colectomy with discontinuity  and Abthera VAC placement, Dr. Donne Hazel 5/5 -still on 2 pressors -OR today -cont NPO and nothing down his tubes as he is in discontinuity   LOS: 4 days    Rolm Bookbinder 12/04/2019

## 2019-11-12 NOTE — Progress Notes (Signed)
PULMONARY / CRITICAL CARE MEDICINE   Name: Juan Bass MRN: [id number] DOB: July 15, 1950    ADMISSION DATE:  11/27/2019 CONSULTATION DATE:  11/07/2019  REFERRING MD:  Dr. Monica Martinez  CHIEF COMPLAINT:  Post Op hyperkalemia, hypoxia, resp distress  HISTORY OF PRESENT ILLNESS:   69 year old man with end-stage renal disease on hemodialysis who underwent an uneventful aorto bifemoral bypass 5/3. Hypotensive following surgery. Increasing hypoxia and respiratory distress today. Associated hypotension requiring initiation of vasopressors.  PAST MEDICAL HISTORY :  He  has a past medical history of AAA (abdominal aortic aneurysm) ([place]), Chronic kidney disease, Diabetes mellitus without complication ([place]), Hypertension, Hypothyroidism, and PAD (peripheral artery disease) ([place]).  PAST SURGICAL HISTORY: He  has a past surgical history that includes Back surgery; abdominal aortagram (N/A, 02/22/2014); Spine surgery; AV fistula placement (Left, 08/06/2018); A/V Fistulagram (Left, 10/14/2018); PERIPHERAL VASCULAR BALLOON ANGIOPLASTY (Left, 10/14/2018); Abdominal aortic aneurysm repair (N/A, 11/09/2019); Thrombectomy of bypass graft femoral- popliteal artery (Right, 11/11/2019); Abdominal aortic aneurysm repair (N/A, 11/25/2019); Patch angioplasty (Right, 11/15/2019); Colon resection sigmoid (N/A, 11/20/2019); Application if wound vac (N/A, 11/25/2019); and Fasciotomy (Right, 11/30/2019).   VITAL SIGNS: BP 106/64   Pulse 80   Temp 98.3 F (36.8 C)   Resp 14   Ht 5\' 9"  (1.753 m)   Wt 79 kg   SpO2 100%   BMI 25.72 kg/m   HEMODYNAMICS: CVP:  [4 mmHg-89 mmHg] 4 mmHg CO:  [3.5 L/min-4.5 L/min] 4.5 L/min  VENTILATOR SETTINGS: Vent Mode: PRVC FiO2 (%):  [50 %] 50 % Set Rate:  [22 bmp] 22 bmp Vt Set:  [560 mL] 560 mL PEEP:  [10 cmH20] 10 cmH20 Plateau Pressure:  [20 cmH20-25 cmH20] 25 cmH20  INTAKE / OUTPUT: I/O last 3 completed shifts: In: 3360.7 [I.V.:2638.5; NG/GT:30; IV Piggyback:692.2] Out: 9622  [Emesis/NG output:250; Drains:1050; Other:2194]  PHYSICAL EXAMINATION: Physical Exam Constitutional:      General: He is in acute distress.     Appearance: He is ill-appearing.  HENT:     Mouth/Throat:     Comments: ETT tube in place NGT in place Cardiovascular:     Rate and Rhythm: Tachycardia present.     Heart sounds: Normal heart sounds.  Pulmonary:     Effort: Pulmonary effort is normal.     Breath sounds: Normal breath sounds.  Abdominal:     Comments: Midline VAC  Neurological:     Comments: Sedated on fentanyl     LABS:  BMET Recent Labs  Lab 11/11/19 1500 11/11/19 2110 11/30/2019 0340 11/13/2019 0340 12/02/2019 0812 11/15/2019 0812 11/22/2019 0950 11/09/2019 1304 11/25/2019 1644  NA 136  --  138   < > 137   < > 138 138 136  K 4.4   < > 4.3   < > 4.1   < > 4.0 3.5 3.9  CL 102  --  102  --  99  --   --   --  101  CO2 22  --  25  --   --   --   --   --  23  BUN 27*  --  28*  --  31*  --   --   --  32*  CREATININE 3.74*  --  3.27*  --  3.50*  --   --   --  3.18*  GLUCOSE 112*  --  236*  --  243*  --   --   --  165*   < > = values in this interval  not displayed.    Electrolytes Recent Labs  Lab 12/02/2019 0531 11/22/2019 1240 11/11/19 0221 11/11/19 0221 11/11/19 1500 12/04/2019 0340 11/28/2019 1644  CALCIUM 7.4*   < > 6.8*   < > 6.8* 7.3* 8.1*  MG 2.4  --  2.6*  --   --  2.5*  --   PHOS 7.6*   < > 7.8*   < > 7.0* 4.8* 4.7*   < > = values in this interval not displayed.    CBC Recent Labs  Lab 11/25/2019 1240 11/07/2019 1248 11/11/19 0221 11/11/19 0237 11/22/2019 0340 11/15/2019 0340 11/21/2019 0812 11/18/2019 0950 12/02/2019 1304  WBC 3.9*  --  5.8  --  5.6  --   --   --   --   HGB 10.4*   < > 10.2*   < > 8.9*   < > 8.5* 8.2* 8.2*  HCT 31.6*   < > 31.7*   < > 27.2*   < > 25.0* 24.0* 24.0*  PLT 89*  --  74*  --  53*  --   --   --   --    < > = values in this interval not displayed.    Coag's Recent Labs  Lab 11/27/2019 1334 11/11/2019 1334 11/15/2019 0531  11/09/2019 1240 11/11/19 0221  APTT 46*  --   --   --   --   INR 1.3*   < > 2.1* 2.1* 1.7*   < > = values in this interval not displayed.    Sepsis Markers Recent Labs  Lab 11/21/2019 1240 11/11/19 0221 11/11/19 1652  LATICACIDVEN 5.5* 3.9* 2.7*    ABG Recent Labs  Lab 11/11/19 0650 11/11/2019 0950 11/13/2019 1304  PHART 7.324* 7.350 7.383  PCO2ART 46.4 46.2 41.6  PO2ART 93 100 120*    Liver Enzymes Recent Labs  Lab 11/20/2019 0531 11/24/2019 0531 11/26/2019 1240 11/28/2019 1613 11/11/19 0221 11/11/19 0221 11/11/19 1500 11/06/2019 0340 11/30/2019 1644  AST 1,810*  --  2,133*  --  2,371*  --   --   --   --   ALT 1,016*  --  848*  --  816*  --   --   --   --   ALKPHOS 52  --  52  --  75  --   --   --   --   BILITOT 2.2*  --  2.0*  --  1.7*  --   --   --   --   ALBUMIN 3.1*   < > 2.6*   < > 2.7*   < > 2.8* 2.6* 2.6*   < > = values in this interval not displayed.    Cardiac Enzymes No results for input(s): TROPONINI, PROBNP in the last 168 hours.  Glucose Recent Labs  Lab 11/11/19 1758 11/11/19 2321 11/09/2019 0049 11/07/2019 0344 11/13/2019 1155 12/06/2019 1556  GLUCAP 119* 202* 213* 229* 217* 163*    Imaging No results found.   STUDIES:    CULTURES: .N/A  ANTIBIOTICS: Anti-infectives (From admission, onward)   Start     Dose/Rate Route Frequency Ordered Stop   11/09/19 1900  cefTRIAXone (ROCEPHIN) 2 g in sodium chloride 0.9 % 100 mL IVPB     2 g 200 mL/hr over 30 Minutes Intravenous Every 24 hours 11/09/19 1725     11/09/19 1900  metroNIDAZOLE (FLAGYL) IVPB 500 mg     500 mg 100 mL/hr over 60 Minutes Intravenous Every 8 hours 11/09/19 1725  11/09/19 0800  ceFAZolin (ANCEF) IVPB 2g/100 mL premix     2 g 200 mL/hr over 30 Minutes Intravenous Every 8 hours 11/07/2019 1545 11/09/19 1559   11/15/2019 0625  ceFAZolin (ANCEF) IVPB 2g/100 mL premix     2 g 200 mL/hr over 30 Minutes Intravenous 30 min pre-op 11/15/2019 0625 11/06/2019 0840       SIGNIFICANT  EVENTS: 11/09/2019 found to be hypoxic 11/09/2019 pulmonary critical care for hypoxia 11/09/2019 Critical level hyperkalemia >7 admin calcium gluconate 11/11/2019 Procedure(s) Performed: THROMBECTOMY OF BYPASS GRAFT OF LEG (Right Groin) EXPLORATORY LAPAROTOMY (N/A Abdomen) Patch Angioplasty (Right Groin) Colon Resection Sigmoid (N/A Abdomen) Application Of Wound Vac (N/A Abdomen) Fasciotomy (Right Leg Lower)  LINES/TUBES: CVC Line  CVC Double Lumen 11/24/2019 Right Internal jugular 1 day  PIV Line  Peripheral IV 11/19/2019 Right Hand 2 days  Peripheral IV 11/09/19 Anterior;Distal;Right;Upper Arm less than 1 day    Post Cath / Sheath Left Arterial -- days  Fistula / Graft Left Upper arm Arteriovenous fistula 461 days  Hemodialysis Catheter Left Internal jugular Triple lumen Temporary (Non-Tunneled) less than 1 day     DISCUSSION: Postop day 3 status post open repair of juxtarenal abdominal aortic aneurysm. Patient has done very poorly postoperatively and now intubated on pressors with multisystem organ failure. Mottling was noted on lower right leg. Pt was noted to have dilated large bowel on imaging as well  Pt had brief cardiac arrest yesterday and was intubated and started on CRRT. His clinical course continued to deteriorate and developed refractory shock, hyperkalemia, lactic acidosis. Right leg became increasingly mottled. He was emergently taken to OR for lap and revealed ischemic left colon requiring left colectomy with abdominal wound vac in place.    Thrombectomy was also performed due to ischemic right leg as well as fasciotomy. Patient was then transported back to ICU where he is currently on dialysis and hemodynamically stable.   Pharm consulted for management of TPN over course of stay at ICU. Pt is NPO w colectomy and will undergo surgery Friday. TPN to start tomorrow.   ASSESSMENT / PLAN: Pt had left colectomy w discontinuity and VAC placement. No anesthesia or op  complications noted. Pt initially presented to ICU post op aortofemoral bypass graft. Patient went into cardiac arrest. Further complications included sever hyperkalemia, mottling of right lower extremity and post op colonic illeus.  Patient is currently hemodynamically stable and on ventilation. Central line present, patient on hemodialysis.   Shock/MOS failure/Metabolic (lactic) Acidosis A: Shock w multisystem dysfunction post op AAA repair/ aortofemoral bypass- Likely due to ischemic dead bowel w subsequent lactic acidosis. Required urgent left colectomy. Portion of rectum also noted to be necrotic during operation w rectal stump remaining viable. Serum lactate notably elevated prior to emergent lap, svr hyperkalemia  P: - Cont Vasopressin 40units NaCl 23ml IV infusion - Cont Norepi IV 16mg  in 226mL at ordered rate - Goal MAP >65 SBP>90 - Hold antihypertensives - Fluid management I/Os   ESRD- refractory hyperkalemia Pt his hx of chronic kidney disease. Hyperkalemia likely due to noted ischemic and necrotic bowel causing multiorgan system failure. Pt was given calcium gluconate and bicarbonate at time of MOS. Post op potassium likely to normalize. Pt was treated acutely w calcium gluconate and bicarb in setting of lactic acidosis and MOS. Continue to monitor  - Hemodialysis  -Trend renal function panel - CMP and trend electrolytes K, Mg, phosphorus - Lactic acid check - Albumin 12.5 IV at 44ml/hr -- Nephrology recommendations  appreciated - Daily weights / fluid removal rate  Ischemic Colitis Likely offending eti of MOS post op. Post op lap removal of necrotic large bowel pt has been stablized. Pt will have operation per surgery Friday.  -NPO, NGT placed -Appreciate pharm recommendations TPN Antibiotics Given (last 72 hours)    ceFAZolin (ANCEF) IVPB 2g/100 mL premix 2 g    cefTRIAXone (ROCEPHIN) 2 g in sodium chloride 0.9 % 100 mL IVPB 2 g 200 mL/hr   metroNIDAZOLE (FLAGYL) IVPB  500 mg 500 mg 100 mL/hr    Anemia- s/p 3 units of PRBC's yesterday.   -CTM/ CBC   T2DM  Insulin aspart 0-15 unit inection SQ q6hrs    FAMILY  - Updates:  Family has been present and notified of the acute changes and emergent complications and procedures.   CRITICAL CARE Performed by: Kipp Brood   Total critical care time: 30 minutes  Critical care time was exclusive of separately billable procedures and treating other patients.  Critical care was necessary to treat or prevent imminent or life-threatening deterioration.  Critical care was time spent personally by me on the following activities: development of treatment plan with patient and/or surrogate as well as nursing, discussions with consultants, evaluation of patient's response to treatment, examination of patient, obtaining history from patient or surrogate, ordering and performing treatments and interventions, ordering and review of laboratory studies, ordering and review of radiographic studies, pulse oximetry, re-evaluation of patient's condition and participation in multidisciplinary rounds.  Kipp Brood, MD [facility] ICU Physician [place]  Pager: [phone] Mobile: [phone] After hours: [phone].    11/22/2019, 6:56 PM

## 2019-11-12 NOTE — Progress Notes (Signed)
[name], male   DOB: 1951-03-21, 69 y.o.   MRN: [id number] S: Taken back to OR this morning for washout and partial closure of right lower extremity, medial fasciotomy with wound vac, as well as additional resection of ischemic bowel and wound vac placement.  Will likely need to go back on Sunday per Surgery. O:BP (!) 149/63   Pulse 72   Temp 98.4 F (36.9 C)   Resp (!) 22   Ht 5\' 9"  (1.753 m)   Wt 79 kg   SpO2 100%   BMI 25.72 kg/m   Intake/Output Summary (Last 24 hours) at 11/27/2019 1123 Last data filed at 11/25/2019 1039 Gross per 24 hour  Intake 3562.38 ml  Output 1671 ml  Net 1891.38 ml   Intake/Output: I/O last 3 completed shifts: In: 3360.7 [I.V.:2638.5; NG/GT:30; IV Piggyback:692.2] Out: 1660 [Emesis/NG output:250; Drains:1050; YTKZS:0109]  Intake/Output this shift:  Total I/O In: 1923 [I.V.:400; Blood:1023; IV Piggyback:500] Out: 175 [Blood:175] Weight change: -0.6 kg Gen: intubated and sedated CVS: RRR Resp: CTA Abd: wound vac in place Ext: mottled ischemic changes bilaterally R>L, LUE AVF +T/B  Recent Labs  Lab 11/09/19 0145 11/09/19 0759 11/09/19 2302 12/06/2019 0008 11/15/2019 0305 12/03/2019 0305 11/29/2019 0531 12/05/2019 0541 11/07/2019 1240 11/26/2019 1248 11/24/2019 1437 11/09/2019 1437 12/03/2019 1613 11/11/19 0221 11/11/19 0237 11/11/19 0650 11/11/19 1500 11/11/19 2110 11/19/2019 0340  NA 139   < > 138   < > 141   < > 139   < > 139   < > 139  --  140 137 137 140 136  --  138  K 3.5   < > >7.5*   < > 6.7*   < > 6.2*   < > 6.4*   < > 5.7*   < > 5.9* 5.1 5.1 4.5 4.4 4.7 4.3  CL 104   < > 105   < > 104  --  103  --  103  --   --   --  105 100  --   --  102  --  102  CO2 23   < > 19*   < > 20*  --  20*  --  19*  --   --   --  21* 23  --   --  22  --  25  GLUCOSE 77   < > 131*   < > 66*  --  85  --  88  --   --   --  104* 114*  --   --  112*  --  236*  BUN 24*   < > 33*   < > 33*  --  33*  --  39*  --   --   --  37* 31*  --   --  27*  --  28*   CREATININE 5.14*   < > 5.47*   < > 5.28*  --  5.08*  --  5.17*  --   --   --  4.74* 4.08*  --   --  3.74*  --  3.27*  ALBUMIN 2.8*  --  2.2*   < > 3.0*  --  3.1*  --  2.6*  --   --   --  2.8* 2.7*  --   --  2.8*  --  2.6*  CALCIUM 7.8*   < > 7.2*   < > 8.0*  --  7.4*  --  7.1*  --   --   --  7.0* 6.8*  --   --  6.8*  --  7.3*  PHOS 2.9  --  8.4*  --  8.6*  --  7.6*  --   --   --   --   --  8.7* 7.8*  --   --  7.0*  --  4.8*  AST 303*  --   --   --   --   --  1,810*  --  2,133*  --   --   --   --  2,371*  --   --   --   --   --   ALT 259*  --   --   --   --   --  1,016*  --  848*  --   --   --   --  816*  --   --   --   --   --    < > = values in this interval not displayed.   Liver Function Tests: Recent Labs  Lab 11/27/2019 0531 12/03/2019 0531 12/04/2019 1240 11/24/2019 1613 11/11/19 0221 11/11/19 1500 11/27/2019 0340  AST 1,810*  --  2,133*  --  2,371*  --   --   ALT 1,016*  --  848*  --  816*  --   --   ALKPHOS 52  --  52  --  75  --   --   BILITOT 2.2*  --  2.0*  --  1.7*  --   --   PROT 5.1*  --  4.5*  --  5.2*  --   --   ALBUMIN 3.1*   < > 2.6*   < > 2.7* 2.8* 2.6*   < > = values in this interval not displayed.   Recent Labs  Lab 11/09/19 0145  AMYLASE 258*   No results for input(s): AMMONIA in the last 168 hours. CBC: Recent Labs  Lab 11/09/19 1421 11/09/19 1721 11/30/2019 0531 11/23/2019 0541 11/18/2019 1240 12/01/2019 1248 11/11/19 0221 11/11/19 0221 11/11/19 0237 11/11/19 0650 11/07/2019 0340  WBC 11.8*   < > 6.3   < > 3.9*  --  5.8  --   --   --  5.6  NEUTROABS  --   --  5.1  --   --   --  5.2  --   --   --   --   HGB 11.5*   < > 9.6*   < > 10.4*   < > 10.2*   < > 9.9* 9.5* 8.9*  HCT 34.7*   < > 29.7*   < > 31.6*   < > 31.7*   < > 29.0* 28.0* 27.2*  MCV 84.6  --  85.1  --  84.9  --  85.0  --   --   --  84.7  PLT 103*   < > 89*   < > 89*  --  74*  --   --   --  53*   < > = values in this interval not displayed.   Cardiac Enzymes: No results for input(s): CKTOTAL, CKMB,  CKMBINDEX, TROPONINI in the last 168 hours. CBG: Recent Labs  Lab 11/11/19 1125 11/11/19 1758 11/11/19 2321 11/11/2019 0049 11/17/2019 0344  GLUCAP 104* 119* 202* 213* 229*    Iron Studies: No results for input(s): IRON, TIBC, TRANSFERRIN, FERRITIN in the last 72 hours. Studies/Results: DG CHEST PORT 1 VIEW  Result Date: 11/11/2019 CLINICAL DATA:  Intubation. EXAM: PORTABLE CHEST 1  VIEW COMPARISON:  12/04/2019. FINDINGS: Endotracheal tube, NG tube, right IJ sheath, left subclavian line in stable position. Heart size stable. Bibasilar infiltrates/edema noted on today's exam. Bibasilar atelectasis. Small right pleural effusion. No pneumothorax. Degenerative change thoracic spine. IMPRESSION: 1. Bibasilar infiltrates/edema noted on today's exam. Bibasilar atelectasis. Small right pleural effusion. 2.  Lines and tubes in stable position. Electronically Signed   By: Marcello Moores  Register   On: 11/11/2019 08:10   . chlorhexidine  15 mL Mouth Rinse BID  . Chlorhexidine Gluconate Cloth  6 each Topical Q0600  . heparin  5,000 Units Subcutaneous Q8H  . insulin aspart  0-15 Units Subcutaneous Q4H  . mouth rinse  15 mL Mouth Rinse Q2H  . pantoprazole (PROTONIX) IV  40 mg Intravenous QHS  . sodium bicarbonate  50 mEq Intravenous Once  . sodium chloride flush  10-40 mL Intracatheter Q12H    BMET    Component Value Date/Time   NA 138 11/09/2019 0340   K 4.3 11/09/2019 0340   CL 102 12/03/2019 0340   CO2 25 11/11/2019 0340   GLUCOSE 236 (H) 11/17/2019 0340   BUN 28 (H) 11/23/2019 0340   CREATININE 3.27 (H) 11/29/2019 0340   CALCIUM 7.3 (L) 11/07/2019 0340   GFRNONAA 19 (L) 12/06/2019 0340   GFRAA 21 (L) 11/22/2019 0340   CBC    Component Value Date/Time   WBC 5.6 11/13/2019 0340   RBC 3.21 (L) 11/15/2019 0340   HGB 8.9 (L) 12/03/2019 0340   HCT 27.2 (L) 11/13/2019 0340   PLT 53 (L) 12/06/2019 0340   MCV 84.7 12/06/2019 0340   MCH 27.7 12/02/2019 0340   MCHC 32.7 11/26/2019 0340   RDW 17.4  (H) 11/15/2019 0340   LYMPHSABS 0.4 (L) 11/11/2019 0221   MONOABS 0.2 11/11/2019 0221   EOSABS 0.0 11/11/2019 0221   BASOSABS 0.0 11/11/2019 0221    Assessment/Plan:  1. Shock with multisystem organ failure following AAA repair- due to ischemic/dead bowel s/p emergent exp lap and left colectomy 12/01/2019. 1. Will keep even with CRRT as he is on pressors and has had ongoing ischemic bowel. 2. AAA- s/p repair 12/01/2019 3. ESRD-currently on CRRT due to the development of shock with MOS failure.  1. Was changed from all 0K/2.5Ca to 4K/2.5 Ca for dialysate and pre-filter and 0K/2.5 Ca for post filter replacement fluids. 2. Will continue with the above combination and follow K levels later today  3. Resume CRRT post-operatively after arterial line is established. 4. Will keep even with CRRT due to ongoing bowel ischemia.  4. PAD- ischemic BLE, s/p thrombectomy of right.  Plan per [facility] 5. Hyperkalemia-improved with CRRT and surgery. 6. VDRF/SOB-intubated 11/09/19 after cardiac arrest. His earlier SOB was likely due to ischemic bowel and the development of lactic acidosis.PCCM following now.  7. Anemia- s/p 3 units of PRBC's. Follow H/H 8. HTN-meds on hold due to shock.Continue to follow. 9. CKD MBD- low phos, hold binders 10. Thrombocytopenia - will need to follow  Donetta Potts, MD [facility] [phone]

## 2019-11-12 NOTE — Progress Notes (Addendum)
AAA Progress Note    12/06/2019 7:08 AM 2 Days Post-Op  Subjective:  Intubated/sedated  Afebrile-temp up to 98.4 HR 90's-110's afib 676'H-209'O systolic 709% .[id number]  Gtts:   Amiodarone Levophed Vasopressin Fentanyl  Vitals:   11/30/2019 0500 11/13/2019 0515  BP: 104/73   Pulse: (!) 107 (!) 111  Resp: (!) 22 20  Temp: (!) 96.8 F (36 C) (!) 97 F (36.1 C)  SpO2: 100% 100%    Physical Exam: Cardiac:  irregular Lungs:  Non labored Abdomen:  Wound vac in place Incisions:  Bilateral groins soft and incisions look good Extremities:  Monophasic right peroneal/PT with ischemic right foot now with small blistering on the dorsum and continues to have mottling up to mid thigh and brisk left PT/DP/peroneal with mild mottling on the left foot and leg   CBC    Component Value Date/Time   WBC 5.6 11/09/2019 0340   RBC 3.21 (L) 11/06/2019 0340   HGB 8.9 (L) 11/19/2019 0340   HCT 27.2 (L) 11/15/2019 0340   PLT 53 (L) 12/06/2019 0340   MCV 84.7 11/20/2019 0340   MCH 27.7 12/02/2019 0340   MCHC 32.7 12/02/2019 0340   RDW 17.4 (H) 11/06/2019 0340   LYMPHSABS 0.4 (L) 11/11/2019 0221   MONOABS 0.2 11/11/2019 0221   EOSABS 0.0 11/11/2019 0221   BASOSABS 0.0 11/11/2019 0221    BMET    Component Value Date/Time   NA 138 11/20/2019 0340   K 4.3 11/09/2019 0340   CL 102 12/05/2019 0340   CO2 25 11/06/2019 0340   GLUCOSE 236 (H) 11/17/2019 0340   BUN 28 (H) 11/23/2019 0340   CREATININE 3.27 (H) 11/11/2019 0340   CALCIUM 7.3 (L) 12/02/2019 0340   GFRNONAA 19 (L) 11/23/2019 0340   GFRAA 21 (L) 11/09/2019 0340    INR    Component Value Date/Time   INR 1.7 (H) 11/11/2019 0221     Intake/Output Summary (Last 24 hours) at 12/06/2019 0708 Last data filed at 11/25/2019 0600 Gross per 24 hour  Intake 2522.68 ml  Output 2286 ml  Net 236.68 ml     Assessment/Plan:  69 y.o. male is s/p  Aortobifemoral bypass grafting 4 Days Post-Op  And Left colectomy/vac placement  (general surgery) and thrombectomy RLE with bovine patch angioplasty of right CFA with 4 compartment fasciotomies 2 Days Post-Op  -Vascular:  Pt continues to have ischemic changes to BLE with right greater than left.  Continues to be at risk for amputation but will see how his right leg does with weaning of pressors.  Pt for fasciotomy closures this am -Cardiac:  Pt went into afib yesterday afternoon-amiodarone gtt started.  Pt still recurring pressor support.  Pt may need anticoagulation at some point, but having surgery today.  Pt with thrombocytopenia-HIT panel sent.  If positive and needs AC, will need heparin alternative.  -Pulmonary:  Intubated-vent weaned to .50FiO2  -Neuro:  RN reports that he followed commands early in her shift -Renal:  Pt on CRRT; K+ 4.3 this am -GI:  Pt with NGT with 200cc/24hr out.   -Incisions:  Bilateral groins look good and healing nicely.  Open abdomen with wound vac in place with 350cc out last shift and 725cc/24hr -Heme/ID:  Acute surgical blood loss anemia-hgb has slowly drifted down over the past couple of days-will continue to monitor.  Pt with thrombocytopenia with platelet count of 53k this am.  Will order HIT panel.  May need to dc heparin.    Leontine Locket,  PA-C [facility] [phone] 12/02/2019 7:08 AM   I have seen and evaluated the patient. I agree with the PA note as documented above.  Postop day 4 status post open juxtarenal abdominal aortic aneurysm repair.  Postoperative course complicated by ischemic left colon and ischemic right leg.  He has stabilized in the ICU but remains critically ill and lactic acid is clearing.  He has had good signals in the right lower extremity.  He does have bilateral lower extremity mottling but slowly improving this partially pressure induced.  N: GCS 3T, bedside nurse reports he follows commands when pressors weaned CV: afib with rvr yesterday, started amiodarone, Hgb 9.5 --> 8.9, levophed  10, vaso 0.02 Pulm: wean vent, intubated, FiO2 now 50% GI: NPO, NG ILWS, OR today with gen surg washout and closure and stoma if able Renal: CRRT, ESRD prior to surgery, has made managing volume status difficult ID: ischemic left colon post-op, septic shock, remains on ceftriaxone and flagyl FEN: Will attempt to pull more volume after abdominal closure Left DP/PT brisk, right PT/peroneal signals, watch mottling right leg worse - some mild improvement - still high risk for limb loss Washout and attempt closure right leg fasciotomies today    Marty Heck, MD [facility] [place] Office: [phone]

## 2019-11-12 NOTE — Progress Notes (Signed)
PHARMACY - TOTAL PARENTERAL NUTRITION CONSULT NOTE  Indication:  Bowel ischemia  Patient Measurements: Height: 5\' 9"  (175.3 cm) Weight: 79 kg (174 lb 2.6 oz) IBW/kg (Calculated) : 70.7 TPN AdjBW (KG): 75 Body mass index is 25.72 kg/m.  Assessment:   89 YOM presented on 11/15/2019 for AAA repair with aortobifemoral bypass.  Post-op course complicated by increased WOB, hypotension and cardiac arrest.  He was taken back to the OR on 11/18/2019 for left colectomy (left in discontinuity) and VAC placement for ischemic left colon, and reopening of recent laparotomy, RLE thrombectomy and 4 compartment fasciotomies.  Pharmacy consulted to manage TPN as no oral/enteral nutrition anticipated within the next several days.  Patient is intubated and sedated - unsure of nutritional status PTA.  Glucose / Insulin: hx DM on glipizide PTA - CBGs elevated in 200s since TPN started, on SSI with no usage yesterday Electrolytes: K down to 4.3 (0K bath through CRRT), low iCa, Phos down to 4.8, Mag down to 2.5 Renal: ESRD, last HD session 5/3.  CRRT started 5/4.  SCr down 3.27, BUN 31 LFTs / TGs: AST/ALT 2371/816, tbili down to 1.7, TG WNL Prealbumin / albumin: albumin 2.6, amylase 258, prealbumin slightly low at 15.5 Intake / Output; MIVF: no UOP, NG O/P 223mL, drain 757mL, net +6.6L GI Imaging: none since TPN consult Surgeries / Procedures: 5/7 - plan for fasciotomy closure, colostomy and possible abd closure  Central access: CVC double lumen placed 11/20/2019 TPN start date: 11/11/19  Nutritional Goals (RD rec on 5/5): 1875-2250 kCal, 130-150gm protein per day  Current Nutrition:  TPN  Plan:  Continue concentrated TPN at 35 ml/hr (goal rate of 70 ml/hr).  Advance when CBGs are better controlled. TPN provides 75g AA and 193g CHO for a total of 956 kCal, meeting ~50% of patient needs Hold ILE for the first 7 days of TPN in critically ill patients per ASPEN guidelines.  Add SMOFlipid on 11/15/19. Electrolytes in TPN:  standard Na, increase Ca, Cl:Ac 1:2 - may need to add other lytes as CRRT continues to clear them Daily multivitamin and trace elements in TPN.  No chromium with ESRD. Change to moderate SSI Q4H - assess response to SSI and hold off on adding insulin to TPN F/U AM labs, CBGs  Bettey Muraoka D. Mina Marble, PharmD, BCPS, [place] 12/01/2019, 7:33 AM

## 2019-11-12 NOTE — Progress Notes (Signed)
n PULMONARY / CRITICAL CARE MEDICINE   Name: Juan Bass MRN: [id number] DOB: 1950/10/05    ADMISSION DATE:  11/09/2019 CONSULTATION DATE: 11/07/2019  REFERRING MD:  Dr. Monica Martinez   CHIEF COMPLAINT:  Post-Op hyperkalemia, hypoxia. resp distres  HISTORY OF PRESENT ILLNESS:   69 year old man with end-stage renal disease on hemodialysis who underwent an uneventful aorto bifemoral bypass 5/3. Hypotensive following surgery. Increasing hypoxia and respiratory distress post op day 1 followed by brief cardiac arrest. Required intubation and initiation of vasopressors after shock.  CT chest r/o PE and aortic dissection. Revealed left colon and large bowel dilation. Upon re-evaluation the patient was unable to re-estab flow to right leg. Dopplers were negative for pulse and patient was rush for emergency left colectomy and fasciotomy times 4  Post op day 3 (5/6) pt had intermittent SVT progress to a-fib. Was given amiodarone to abort and treat. Patient remains on 2 pressors and scheduled for OR today 5/7.   PAST MEDICAL HISTORY :  He  has a past medical history of AAA (abdominal aortic aneurysm) ([place]), Chronic kidney disease, Diabetes mellitus without complication ([place]), Hypertension, Hypothyroidism, and PAD (peripheral artery disease) ([place]).  PAST SURGICAL HISTORY: He  has a past surgical history that includes Back surgery; abdominal aortagram (N/A, 02/22/2014); Spine surgery; AV fistula placement (Left, 08/06/2018); A/V Fistulagram (Left, 10/14/2018); PERIPHERAL VASCULAR BALLOON ANGIOPLASTY (Left, 10/14/2018); Abdominal aortic aneurysm repair (N/A, 11/15/2019); Thrombectomy of bypass graft femoral- popliteal artery (Right, 11/19/2019); Abdominal aortic aneurysm repair (N/A, 11/29/2019); Patch angioplasty (Right, 11/27/2019); Colon resection sigmoid (N/A, 11/11/2019); Application if wound vac (N/A, 11/24/2019); and Fasciotomy (Right, 11/30/2019).  No Known Allergies   Current Facility-Administered  Medications (Endocrine & Metabolic):  .  insulin aspart (novoLOG) injection 0-15 Units .  vasopressin (PITRESSIN) 40 Units in sodium chloride 0.9 % 250 mL (0.16 Units/mL) infusion   Current Facility-Administered Medications (Cardiovascular):  .  [EXPIRED] amiodarone (NEXTERONE PREMIX) 360-4.14 MG/200ML-% (1.8 mg/mL) IV infusion **IN FOLLOWED-BY LINKED GROUP WITH** amiodarone (NEXTERONE PREMIX) 360-4.14 MG/200ML-% (1.8 mg/mL) IV infusion *  .  norepinephrine (LEVOPHED) 16 mg in 267mL premix infusion     Current Facility-Administered Medications (Analgesics):  .  acetaminophen (TYLENOL) tablet 325-650 mg **OR** [DISCONTINUED] acetaminophen (TYLENOL) suppository 325-650 mg  .  fentaNYL (SUBLIMAZE) bolus via infusion 25 mcg .  fentaNYL (SUBLIMAZE) injection 25 mcg .  fentaNYL (SUBLIMAZE) injection 25-100 mcg .  fentaNYL 2514mcg in NS 254mL (64mcg/ml) infusion-PREMIX .  lidocaine (PF) (XYLOCAINE) 1 % injection 5 mL   Current Facility-Administered Medications (Hematological):  .  alteplase (CATHFLO ACTIVASE) injection 2 mg .  heparin injection 1,000-6,000 Units .  heparinized saline (2000 units/L) primer fluid for CRRT   Current Facility-Administered Medications (Other):  .   prismasol BGK 4/2.5 infusion .  0.9 %  sodium chloride infusion .  0.9 %  sodium chloride infusion .  0.9 %  sodium chloride infusion .  Place/Maintain arterial line AND0.9 %  sodium chloride infusion  .  cefTRIAXone (ROCEPHIN) 2 g in sodium chloride 0.9 % 100 mL IVPB .  chlorhexidine (PERIDEX) 0.12 % solution 15 mL .  Chlorhexidine Gluconate Cloth 2 % PADS 6 each .  dexmedetomidine (PRECEDEX) 400 MCG/100ML (4 mcg/mL) infusion .  lidocaine-prilocaine (EMLA) cream 1 application .  MEDLINE mouth rinse .  metroNIDAZOLE (FLAGYL) IVPB 500 mg .  midazolam (VERSED) 2 MG/2ML injection .  midazolam (VERSED) injection 1 mg .  midazolam (VERSED) injection 1 mg .  ondansetron (ZOFRAN) injection 4 mg .  pantoprazole  (  PROTONIX) injection 40 mg .  pentafluoroprop-tetrafluoroeth (GEBAUERS) aerosol 1 application .  phenol (CHLORASEPTIC) mouth spray 1 spray .  prismasol BGK 0/2.5 infusion .  prismasol BGK 4/2.5 infusion .  sodium bicarbonate injection 50 mEq .  sodium chloride flush (NS) 0.9 % injection 10-40 mL .  TPN ADULT (ION)  No current facility-administered medications on file prior to encounter.   Current Outpatient Medications on File Prior to Encounter  Medication Sig  . aspirin EC 81 MG tablet Take 1 tablet (81 mg total) by mouth daily with breakfast.  . Cholecalciferol (VITAMIN D3) 50 MCG (2000 UT) TABS Take 2,000 Units by mouth 4 (four) times a week.  . labetalol (NORMODYNE) 200 MG tablet Take 2 tablets (400 mg total) by mouth 2 (two) times daily. (Patient taking differently: Take 200 mg by mouth 2 (two) times daily. )  . levothyroxine (SYNTHROID) 50 MCG tablet Take 1 tablet (50 mcg total) by mouth daily before breakfast.  . acetaminophen (TYLENOL) 325 MG tablet Take 2 tablets (650 mg total) by mouth every 6 (six) hours as needed for mild pain (or Fever >/= 101).   SUBJECTIVE  More comfortable on ventilator since initiated fluid removal by CRRT, also significant fluid loss via wound VAC. Unable to wean pressors before OR  VITAL SIGNS: BP (!) 161/73   Pulse (!) 108   Temp 98.4 F (36.9 C)   Resp (!) 22   Ht 5\' 9"  (1.753 m)   Wt 79 kg   SpO2 100%   BMI 25.72 kg/m   HEMODYNAMICS: CVP:  [6 mmHg-89 mmHg] 9 mmHg CO:  [3.5 L/min-4.5 L/min] 4.5 L/min  VENTILATOR SETTINGS: Vent Mode: PRVC FiO2 (%):  [50 %-60 %] 50 % Set Rate:  [22 bmp] 22 bmp Vt Set:  [560 mL] 560 mL PEEP:  [10 cmH20] 10 cmH20 Plateau Pressure:  [20 cmH20-25 cmH20] 23 cmH20  INTAKE / OUTPUT: I/O last 3 completed shifts: In: 3360.7 [I.V.:2638.5; NG/GT:30; IV Piggyback:692.2] Out: 4656 [Emesis/NG output:250; Drains:1050; Other:2194]  PHYSICAL EXAMINATION: Physical Exam  Constitutional: He has a sickly  appearance. No distress. He is intubated.  Sedated male, unresponsive, does not respond or follow commands.   HENT:  Head: Atraumatic.  Eyes: Pupils are equal, round, and reactive to light. Conjunctivae are normal.  Neck: No JVD present. No thyromegaly present.  Cardiovascular: S1 normal, S2 normal and normal pulses. An irregular rhythm present. Tachycardia present. Exam reveals no friction rub.  Doppler confirmation of BLE pulses present  Pulmonary/Chest: Breath sounds normal. He is intubated.  Abdominal: Soft. Bowel sounds are not hypoactive. There is no hepatosplenomegaly.  Abd wound vac place. No notable induration or irritation on periphery  Musculoskeletal:     Right shoulder: Swelling present. Normal strength.     Cervical back: Neck supple.     Right ankle: Swelling and ecchymosis present.     Left ankle: Swelling present.     Comments: Mottling still present at bilateral knees and ankles. Pulse confirmed. Tense anterior lower extremities but is decreased from previous examination. Blisters w slight weeping present on RLE to dorsal surface of foot.  Neurological:  Pt remains unresponsive  Skin: Skin is dry. Bruising and ecchymosis noted.  Mottling present but less pronounced as previous P/E in both lower extremities    LABS:  BMET Recent Labs  Lab 11/11/19 0221 11/11/19 0237 11/11/19 0650 11/11/19 0650 11/11/19 1500 11/11/19 2110 11/15/2019 0340  NA 137   < > 140  --  136  --  138  K 5.1   < > 4.5   < > 4.4 4.7 4.3  CL 100  --   --   --  102  --  102  CO2 23  --   --   --  22  --  25  BUN 31*  --   --   --  27*  --  28*  CREATININE 4.08*  --   --   --  3.74*  --  3.27*  GLUCOSE 114*  --   --   --  112*  --  236*   < > = values in this interval not displayed.    Electrolytes Recent Labs  Lab 11/28/2019 0531 11/11/2019 1240 11/11/19 0221 11/11/19 1500 11/25/2019 0340  CALCIUM 7.4*   < > 6.8* 6.8* 7.3*  MG 2.4  --  2.6*  --  2.5*  PHOS 7.6*   < > 7.8* 7.0* 4.8*    < > = values in this interval not displayed.    CBC Recent Labs  Lab 11/11/2019 1240 11/23/2019 1248 11/11/19 0221 11/11/19 0221 11/11/19 0237 11/11/19 0650 11/29/2019 0340  WBC 3.9*  --  5.8  --   --   --  5.6  HGB 10.4*   < > 10.2*   < > 9.9* 9.5* 8.9*  HCT 31.6*   < > 31.7*   < > 29.0* 28.0* 27.2*  PLT 89*  --  74*  --   --   --  53*   < > = values in this interval not displayed.    Coag's Recent Labs  Lab 11/13/2019 1334 11/06/2019 1334 11/07/2019 0531 11/15/2019 1240 11/11/19 0221  APTT 46*  --   --   --   --   INR 1.3*   < > 2.1* 2.1* 1.7*   < > = values in this interval not displayed.    Sepsis Markers Recent Labs  Lab 11/06/2019 1240 11/11/19 0221 11/11/19 1652  LATICACIDVEN 5.5* 3.9* 2.7*    ABG Recent Labs  Lab 11/24/2019 1437 11/11/19 0237 11/11/19 0650  PHART 7.316* 7.285* 7.324*  PCO2ART 43.8 56.5* 46.4  PO2ART 67* 162* 93    Liver Enzymes Recent Labs  Lab 11/19/2019 0531 11/11/2019 0531 11/20/2019 1240 11/13/2019 1613 11/11/19 0221 11/11/19 1500 12/05/2019 0340  AST 1,810*  --  2,133*  --  2,371*  --   --   ALT 1,016*  --  848*  --  816*  --   --   ALKPHOS 52  --  52  --  75  --   --   BILITOT 2.2*  --  2.0*  --  1.7*  --   --   ALBUMIN 3.1*   < > 2.6*   < > 2.7* 2.8* 2.6*   < > = values in this interval not displayed.    Cardiac Enzymes No results for input(s): TROPONINI, PROBNP in the last 168 hours.  Glucose Recent Labs  Lab 11/11/19 0649 11/11/19 1125 11/11/19 1758 11/11/19 2321 11/15/2019 0049 11/28/2019 0344  GLUCAP 106* 104* 119* 202* 213* 229*    Imaging No results found. ANTIBIOTICS: Anti-infectives (From admission, onward)   Start     Dose/Rate Route Frequency Ordered Stop   11/09/19 1900  [MAR Hold]  cefTRIAXone (ROCEPHIN) 2 g in sodium chloride 0.9 % 100 mL IVPB     (MAR Hold since Fri 11/20/2019 at 0759.Hold Reason: Transfer to a Procedural area.)   2 g 200 mL/hr over  30 Minutes Intravenous Every 24 hours 11/09/19 1725      11/09/19 1900  [MAR Hold]  metroNIDAZOLE (FLAGYL) IVPB 500 mg     (MAR Hold since Fri 12/03/2019 at 0759.Hold Reason: Transfer to a Procedural area.)   500 mg 100 mL/hr over 60 Minutes Intravenous Every 8 hours 11/09/19 1725     11/09/19 0800  ceFAZolin (ANCEF) IVPB 2g/100 mL premix     2 g 200 mL/hr over 30 Minutes Intravenous Every 8 hours 11/22/2019 1545 11/09/19 1559   11/29/2019 0625  ceFAZolin (ANCEF) IVPB 2g/100 mL premix     2 g 200 mL/hr over 30 Minutes Intravenous 30 min pre-op 12/02/2019 9379 11/11/2019 0840      LINES/TUBES: Line Post Cath / Sheath Left Arterial -- days  Fistula / Graft Left Upper arm Arteriovenous fistula 462 days  Hemodialysis Catheter Left Internal jugular Triple lumen Temporary (Non-Tunneled) 1 day   CVC Line CVC Double Lumen 12/05/2019 Right Internal jugular 2 days  PIV Line Peripheral IV 11/24/2019 Right Hand 3 days  Peripheral IV 11/09/19 Anterior;Distal;Right;Upper Arm 1 day  ART Line Arterial Line 11/29/2019 Right Radial 3 days  Introducer Single Lumen 11/07/2019 Internal Jugular Right 2 days   Drain NG/OG Tube Nasogastric 16 Fr. Left nare Documented cm marking at nare/ corner of mouth 3 days  Negative Pressure Wound Therapy Abdomen Mid 1 day    ASSESSMENT / PLAN: 69 year old man with end-stage renal disease on hemodialysis who underwent an uneventful aorto bifemoral bypass 5/3. Postop day 4 s/p open repair of juxtarenal abdominal aortic aneurysm. Patient had done very poorly postoperatively and intubated on pressors with multisystem organ failure.  PT intubated 11/09/19 after cardiac arrest likely due to ischemic bowel and the development of lactic acidosis.  Remains in multisystem organ failure intubated in ICU on CRRT.  Noted to have ischemic right leg last night with no signals.  Right leg became increasingly mottled. He was emergently taken to OR for lap and revealed ischemic left colon requiring left colectomy with abdominal wound vac in place. Maintain  NPO  Pt has not been able to successfully wean off vasopressors. He has remained stable and cleared for OR today for repair of colonic discontinuity and attempt to close RLE fasciotomy x4. Given albumin solution to aid in volume control.   On examination today the pt is sedated and unresponsive. Had slight tachycardia, receiving amiodarone from SVT event yesterday for maint.  Pt was taken to OR this AM (5/7) cholectomy   Critically ill due to Hypoxemic hypercapnic res failure requiring mechanical ventilation.  Weaning FiO2 is goal of care. - Continue full support today post op per Vasc/surg recommendation   Critically ill due to Distributive Shock requiring titration of vasopressors due to ischemic colitis. Improving pressor requirement.s - Pt still requires pressors -  - Attempt to wean vasopressors to keep MAP >65  Critically ill due Acute on chronic renal insufficiency-ESRD-  improving hyperkalemia - Continue CRRT K+ mild elevated - Post op abd closure and colostomy will attempt neg vac pressure - CTM Mg and Phosphorus - Renal fnxn panel schedule  SVT-Afib Pt had Afib in afternoon time, amiodarone gtt started. The patient is still requiring pressors for BP support. -CTM RR -D/C amiodarone   Ischemic Colitis S/p laparotomy 2/2 distal colonic ischemic necrosis - Continue antibiotics  - Re-evaluate post op - Cont Abx regiment Anti-infectives (From admission, onward)   Start     Dose/Rate Route Frequency Ordered Stop   11/09/19 1900  cefTRIAXone (ROCEPHIN) 2 g in sodium chloride 0.9 % 100 mL IVPB     2 g 200 mL/hr over 30 Minutes Intravenous Every 24 hours 11/09/19 1725     11/09/19 1900  metroNIDAZOLE (FLAGYL) IVPB 500 mg     500 mg 100 mL/hr over 60 Minutes Intravenous Every 8 hours 11/09/19 1725        Cholesterol embolization R leg and left foot. Status post recent aortobifemoral bypass Status post thrombectomy for revascularization of right leg- Improving  perfusion.  - continue to monitor pulses, confirm w doppler - fasciotomy care per Vascular surgery.  Anemia- -s/p 3 units of PRBC's 5/5  -CTM/ CBC  T2DM  Insulin aspart SSI  FAMILY  - Updates: both son and grandson present most of day and have been kept informed by staff.    William Dalton MS4  11/07/2019, 9:40 AM

## 2019-11-12 NOTE — Progress Notes (Signed)
Right PT brisk by doppler and peroneal signal after lateral fasciotomy closure.  Marty Heck, MD [facility] Office: [place]

## 2019-11-13 ENCOUNTER — Encounter: Payer: Self-pay | Admitting: *Deleted

## 2019-11-13 DIAGNOSIS — I480 Paroxysmal atrial fibrillation: Secondary | ICD-10-CM

## 2019-11-13 DIAGNOSIS — R579 Shock, unspecified: Secondary | ICD-10-CM

## 2019-11-13 LAB — HEPATIC FUNCTION PANEL
ALT: 523 U/L — ABNORMAL HIGH (ref 0–44)
AST: 1659 U/L — ABNORMAL HIGH (ref 15–41)
Albumin: 2.6 g/dL — ABNORMAL LOW (ref 3.5–5.0)
Alkaline Phosphatase: 75 U/L (ref 38–126)
Bilirubin, Direct: 0.9 mg/dL — ABNORMAL HIGH (ref 0.0–0.2)
Indirect Bilirubin: 0.9 mg/dL (ref 0.3–0.9)
Total Bilirubin: 1.8 mg/dL — ABNORMAL HIGH (ref 0.3–1.2)
Total Protein: 5 g/dL — ABNORMAL LOW (ref 6.5–8.1)

## 2019-11-13 LAB — GLUCOSE, CAPILLARY
Glucose-Capillary: 184 mg/dL — ABNORMAL HIGH (ref 70–99)
Glucose-Capillary: 185 mg/dL — ABNORMAL HIGH (ref 70–99)
Glucose-Capillary: 196 mg/dL — ABNORMAL HIGH (ref 70–99)
Glucose-Capillary: 214 mg/dL — ABNORMAL HIGH (ref 70–99)
Glucose-Capillary: 70 mg/dL (ref 70–99)
Glucose-Capillary: 83 mg/dL (ref 70–99)

## 2019-11-13 LAB — PREPARE FRESH FROZEN PLASMA

## 2019-11-13 LAB — PREPARE PLATELET PHERESIS: Unit division: 0

## 2019-11-13 LAB — CBC
HCT: 30.3 % — ABNORMAL LOW (ref 39.0–52.0)
Hemoglobin: 9.7 g/dL — ABNORMAL LOW (ref 13.0–17.0)
MCH: 27.5 pg (ref 26.0–34.0)
MCHC: 32 g/dL (ref 30.0–36.0)
MCV: 85.8 fL (ref 80.0–100.0)
Platelets: 59 10*3/uL — ABNORMAL LOW (ref 150–400)
RBC: 3.53 MIL/uL — ABNORMAL LOW (ref 4.22–5.81)
RDW: 17.4 % — ABNORMAL HIGH (ref 11.5–15.5)
WBC: 6 10*3/uL (ref 4.0–10.5)
nRBC: 4 % — ABNORMAL HIGH (ref 0.0–0.2)

## 2019-11-13 LAB — RENAL FUNCTION PANEL
Albumin: 2.5 g/dL — ABNORMAL LOW (ref 3.5–5.0)
Albumin: 2.4 g/dL — ABNORMAL LOW (ref 3.5–5.0)
Anion gap: 11 (ref 5–15)
Anion gap: 10 (ref 5–15)
BUN: 26 mg/dL — ABNORMAL HIGH (ref 8–23)
BUN: 27 mg/dL — ABNORMAL HIGH (ref 8–23)
CO2: 24 mmol/L (ref 22–32)
CO2: 23 mmol/L (ref 22–32)
Calcium: 8.2 mg/dL — ABNORMAL LOW (ref 8.9–10.3)
Calcium: 8 mg/dL — ABNORMAL LOW (ref 8.9–10.3)
Chloride: 103 mmol/L (ref 98–111)
Chloride: 102 mmol/L (ref 98–111)
Creatinine, Ser: 2.7 mg/dL — ABNORMAL HIGH (ref 0.61–1.24)
Creatinine, Ser: 2.53 mg/dL — ABNORMAL HIGH (ref 0.61–1.24)
GFR calc Af Amer: 27 mL/min — ABNORMAL LOW (ref >60)
GFR calc Af Amer: 29 mL/min — ABNORMAL LOW (ref >60)
GFR calc non Af Amer: 23 mL/min — ABNORMAL LOW (ref >60)
GFR calc non Af Amer: 25 mL/min — ABNORMAL LOW (ref >60)
Glucose, Bld: 182 mg/dL — ABNORMAL HIGH (ref 70–99)
Glucose, Bld: 185 mg/dL — ABNORMAL HIGH (ref 70–99)
Phosphorus: 3 mg/dL (ref 2.5–4.6)
Phosphorus: 2.3 mg/dL — ABNORMAL LOW (ref 2.5–4.6)
Potassium: 4.1 mmol/L (ref 3.5–5.1)
Potassium: 3.9 mmol/L (ref 3.5–5.1)
Sodium: 138 mmol/L (ref 135–145)
Sodium: 135 mmol/L (ref 135–145)

## 2019-11-13 LAB — CK: Total CK: 49509 U/L — ABNORMAL HIGH (ref 49–397)

## 2019-11-13 LAB — BPAM FFP
Blood Product Expiration Date: 202105122359
Blood Product Expiration Date: 202105122359
ISSUE DATE / TIME: 202105070822
ISSUE DATE / TIME: 202105070822
Unit Type and Rh: 2800
Unit Type and Rh: 8400

## 2019-11-13 LAB — BPAM PLATELET PHERESIS
Blood Product Expiration Date: 202105082359
ISSUE DATE / TIME: 202105070755
Unit Type and Rh: 5100

## 2019-11-13 LAB — TSH: TSH: 6.77 u[IU]/mL — ABNORMAL HIGH (ref 0.350–4.500)

## 2019-11-13 LAB — MAGNESIUM: Magnesium: 2.5 mg/dL — ABNORMAL HIGH (ref 1.7–2.4)

## 2019-11-13 LAB — HEPARIN INDUCED PLATELET AB (HIT ANTIBODY): Heparin Induced Plt Ab: 0.06 OD (ref 0.000–0.400)

## 2019-11-13 MED ORDER — LEVOTHYROXINE SODIUM 100 MCG/5ML IV SOLN
50.0000 ug | Freq: Once | INTRAVENOUS | Status: AC
  Administered 2019-11-13: 50 ug via INTRAVENOUS
  Filled 2019-11-13: qty 5

## 2019-11-13 MED ORDER — ALBUMIN HUMAN 5 % IV SOLN
INTRAVENOUS | Status: AC
  Filled 2019-11-13: qty 250

## 2019-11-13 MED ORDER — ALBUMIN HUMAN 5 % IV SOLN
12.5000 g | Freq: Once | INTRAVENOUS | Status: AC
  Administered 2019-11-13: 12.5 g via INTRAVENOUS

## 2019-11-13 MED ORDER — TRACE MINERALS CU-MN-SE-ZN 300-55-60-3000 MCG/ML IV SOLN
INTRAVENOUS | Status: AC
  Filled 2019-11-13: qty 996.8

## 2019-11-13 MED ORDER — LEVOTHYROXINE SODIUM 100 MCG/5ML IV SOLN
25.0000 ug | Freq: Every day | INTRAVENOUS | Status: DC
  Administered 2019-11-14 – 2019-11-15 (×2): 25 ug via INTRAVENOUS
  Filled 2019-11-13 (×3): qty 5

## 2019-11-13 NOTE — Progress Notes (Signed)
Progress Note: General Surgery Service   Chief Complaint/Subjective: OR yesterday for colostomy creation, small bowel resection, left with open abdomen with abthera vac. Able to wean levophed overnight, A fib with rvr overnight  Objective: Vital signs in last 24 hours: Temp:  [95 F (35 C)-98.3 F (36.8 C)] 96.6 F (35.9 C) (05/08 0745) Pulse Rate:  [65-136] 106 (05/08 0745) Resp:  [14-23] 14 (05/08 0745) BP: (89-157)/(39-76) 126/67 (05/08 0745) SpO2:  [79 %-100 %] 100 % (05/08 0745) Arterial Line BP: (95-184)/(38-79) 150/49 (05/08 0745) FiO2 (%):  [50 %] 50 % (05/08 0729) Weight:  [81.9 kg] 81.9 kg (05/08 0500) Last BM Date: 11/21/2019  Intake/Output from previous day: 05/07 0701 - 05/08 0700 In: 4420.2 [I.V.:2247.9; Blood:1023; IV Piggyback:1149.4] Out: 3288 [Emesis/NG output:600; Drains:850; Blood:175] Intake/Output this shift: No intake/output data recorded.  Gen: intubated, opens eyes  Resp: assisted  Card: irreg irreg  Abd: blue sponge vac in place and functional, left sided ostomy with purple/dusky mucosa  Lab Results: CBC  Recent Labs    12/05/2019 0340 11/15/2019 0812 12/04/2019 1304 11/13/19 0307  WBC 5.6  --   --  6.0  HGB 8.9*   < > 8.2* 9.7*  HCT 27.2*   < > 24.0* 30.3*  PLT 53*  --   --  59*   < > = values in this interval not displayed.   BMET Recent Labs    11/29/2019 1644 11/13/19 0307  NA 136 138  K 3.9 4.1  CL 101 103  CO2 23 24  GLUCOSE 165* 182*  BUN 32* 26*  CREATININE 3.18* 2.70*  CALCIUM 8.1* 8.2*   PT/INR Recent Labs    11/09/2019 1240 11/11/19 0221  LABPROT 22.9* 19.5*  INR 2.1* 1.7*   ABG Recent Labs    11/19/2019 0950 12/04/2019 1304  PHART 7.350 7.383  HCO3 25.7 24.9    Anti-infectives: Anti-infectives (From admission, onward)   Start     Dose/Rate Route Frequency Ordered Stop   11/09/19 1900  cefTRIAXone (ROCEPHIN) 2 g in sodium chloride 0.9 % 100 mL IVPB     2 g 200 mL/hr over 30 Minutes Intravenous Every 24 hours  11/09/19 1725     11/09/19 1900  metroNIDAZOLE (FLAGYL) IVPB 500 mg     500 mg 100 mL/hr over 60 Minutes Intravenous Every 8 hours 11/09/19 1725     11/09/19 0800  ceFAZolin (ANCEF) IVPB 2g/100 mL premix     2 g 200 mL/hr over 30 Minutes Intravenous Every 8 hours 12/03/2019 1545 11/09/19 1559   11/22/2019 0625  ceFAZolin (ANCEF) IVPB 2g/100 mL premix     2 g 200 mL/hr over 30 Minutes Intravenous 30 min pre-op 11/19/2019 0625 12/02/2019 0840      Medications: Scheduled Meds: . chlorhexidine  15 mL Mouth Rinse BID  . Chlorhexidine Gluconate Cloth  6 each Topical Q0600  . insulin aspart  0-15 Units Subcutaneous Q4H  . mouth rinse  15 mL Mouth Rinse Q2H  . pantoprazole (PROTONIX) IV  40 mg Intravenous QHS  . sodium bicarbonate  50 mEq Intravenous Once  . sodium chloride flush  10-40 mL Intracatheter Q12H   Continuous Infusions: .  prismasol BGK 4/2.5 500 mL/hr at 11/13/19 0525  . sodium chloride 500 mL/hr at 11/11/19 1250  . sodium chloride    . sodium chloride    . sodium chloride 20 mL/hr at 11/13/19 0700  . amiodarone 30 mg/hr (11/13/19 0700)  . cefTRIAXone (ROCEPHIN)  IV Stopped (11/27/2019 2023)  .  dexmedetomidine    . fentaNYL infusion INTRAVENOUS 150 mcg/hr (11/13/19 0700)  . metronidazole Stopped (11/13/19 0407)  . norepinephrine (LEVOPHED) Adult infusion 4 mcg/min (11/13/19 0700)  . prismasol BGK 0/2.5 300 mL/hr at 11/15/2019 1900  . prismasol BGK 4/2.5 1,500 mL/hr at 11/13/19 0531  . TPN ADULT (ION) 35 mL/hr at 11/13/19 0700  . TPN ADULT (ION)    . vasopressin (PITRESSIN) infusion - *FOR SHOCK* Stopped (11/26/2019 5643)   PRN Meds:.sodium chloride, sodium chloride, sodium chloride, Place/Maintain arterial line **AND** sodium chloride, acetaminophen **OR** [DISCONTINUED] acetaminophen, alteplase, fentaNYL, fentaNYL (SUBLIMAZE) injection, fentaNYL (SUBLIMAZE) injection, heparin, heparin, lidocaine (PF), lidocaine-prilocaine, midazolam, midazolam, ondansetron,  pentafluoroprop-tetrafluoroeth, phenol, sodium chloride flush  Assessment/Plan: s/p Procedure(s): REOPENING LAPAROTOMY COLOSTOMY WITH ABDOMINAL CLOSURE IRRIGATION AND DEBRIDEMENT OF RIGHT LEG FASCIOTOMY CLOSURE RIGHT LEG Application Of Wound Vac RIGHT LEG Small Bowel Resection with Rectal Resection Application Of Wound Vac 11/20/2019  69 yo male s/p AAA repair complicated with ischemic left colon s/p left colectomy, small bowel resection, left ostomy creation -continue TPN, continue abx, NG tube, bowel rest -plan for return to OR tomorrow for reexploration and possible closure -repeat LFTs due to AST > 1000 on 5/6, if shock liver is persistent clinical outcome expectation would worsen. -discussed plan for procedure tomorrow with patient's son Lanny Hurst. He showed understanding and wanted to proceed.   LOS: 5 days   Mickeal Skinner, MD [facility]

## 2019-11-13 NOTE — Consult Note (Addendum)
Cardiology Consultation:   [name]; [id number]; [birth year]   Admit date: 12/04/2019 Date of Consult: 11/13/2019  Primary Care Provider: Sharilyn Sites, MD Primary Cardiologist: Carlyle Dolly, MD 10/28/2019 Primary Electrophysiologist:  None   Patient Profile:   Juan Bass is a 69 y.o. male with a hx of PAD s/p R EIA stent 2015, DM, HTN, hypothyroid, ESRD on HD, AAA, who is being seen today for the evaluation of rapid atrial fib at the request of Dr Scot Dock.  History of Present Illness:   Mr. Walkup has a hx of 6.1 cm AAA, evaluated by Dr Carlis Abbott. Decision made to do Aorto-bifem bypass grafting, he was admitted for this 11/09/2019.   Postop, he was initially off the vent.  On 5/4, his respiratory status worsened.  He was initially on BiPAP, but had a brief cardiac arrest following a large bowel movement, possibly Valsalva response.  He was resuscitated with 1 round of CPR and epinephrine.  At that time, he was following commands and appropriate questions.  However, his respiratory status deteriorated and he required reintubation.  Bedside quick look echo done at that time showed an EF of approximately 45%  Dr. Carlis Abbott took him back to the OR on 5/5 due to his worsening condition.  His sigmoid colon was necrotic.  He had a left colectomy as well as abdominal VAC placement.  His right lower extremity required a thrombectomy with bovine pericardial patch angioplasty of the right common femoral artery and right lower extremity fasciotomies.    He was taken back to the OR on 5/7 for reopening of the laparotomy and small bowel resection with primary anastomosis as well as resection of additional rectum.  He had an abdominal negative pressure dressing.  He also had an end transverse colostomy as well as washout and closure of the right lower extremity lateral fasciotomy incision and medial fasciotomy incision with negative pressure wound VAC placement.  He has not been  weaned off the vent yet.  His pressors had been weaned down and he is now only on a low-dose of Levophed.  He has thrombocytopenia, but platelets are stable.  He has decreased circulation to his right leg, but they are able to Doppler pulses.  On 5/6, he went into atrial fibrillation and was started on IV amiodarone.  He spontaneously converted to sinus rhythm.  Today, at approximately 4:30 AM, he went into rapid atrial fibrillation.  His heart rate is generally less than 120.  He is still on IV amiodarone, currently at 30 mg an hour.  He is unresponsive on the vent.  Information was obtained from nursing staff and chart notes.  He needs to go back to the OR tomorrow and close the fasciotomy site on his left leg.  His renal disease is being managed by [facility].   Past Medical History:  Diagnosis Date  . AAA (abdominal aortic aneurysm) ([place])   . Chronic kidney disease   . Diabetes mellitus without complication ([place])   . Hypertension   . Hypothyroidism   . PAD (peripheral artery disease) ([facility])    right EIA stent 02/22/14    Past Surgical History:  Procedure Laterality Date  . A/V FISTULAGRAM Left 10/14/2018   Procedure: A/V FISTULAGRAM;  Surgeon: Marty Heck, MD;  Location: [facility];  Service: Cardiovascular;  Laterality: Left;  arm  . ABDOMINAL AORTAGRAM N/A 02/22/2014   Procedure: ABDOMINAL Maxcine Ham;  Surgeon: Serafina Mitchell, MD;  Location: [facility];  Service:  Cardiovascular;  Laterality: N/A;  . ABDOMINAL AORTIC ANEURYSM REPAIR N/A 11/21/2019   Procedure: OPEN JUXTARENAL ABDOMINAL AORTIC ANEURYSM REPAIR using 74mm x 3mm BIFURCATED HEMASHIELD GOLD GRAFT;  Surgeon: Marty Heck, MD;  Location: [place];  Service: Vascular;  Laterality: N/A;  . ABDOMINAL AORTIC ANEURYSM REPAIR N/A 11/13/2019   Procedure: EXPLORATORY LAPAROTOMY;  Surgeon: Marty Heck, MD;  Location: [place];  Service: Vascular;  Laterality: N/A;  . APPLICATION OF WOUND VAC N/A 11/09/2019    Procedure: Application Of Wound Vac;  Surgeon: Marty Heck, MD;  Location: [facility];  Service: Vascular;  Laterality: N/A;  . AV FISTULA PLACEMENT Left 08/06/2018   Procedure: ARTERIOVENOUS (AV) FISTULA CREATION;  Surgeon: Serafina Mitchell, MD;  Location: [place];  Service: Vascular;  Laterality: Left;  . BACK SURGERY    . COLON RESECTION SIGMOID N/A 11/30/2019   Procedure: Colon Resection Sigmoid;  Surgeon: Marty Heck, MD;  Location: [place];  Service: Vascular;  Laterality: N/A;  . FASCIOTOMY Right 11/13/2019   Procedure: Fasciotomy;  Surgeon: Marty Heck, MD;  Location: [place];  Service: Vascular;  Laterality: Right;  . PATCH ANGIOPLASTY Right 11/22/2019   Procedure: Patch Angioplasty right common femoral;  Surgeon: Marty Heck, MD;  Location: [place];  Service: Vascular;  Laterality: Right;  . PERIPHERAL VASCULAR BALLOON ANGIOPLASTY Left 10/14/2018   Procedure: PERIPHERAL VASCULAR BALLOON ANGIOPLASTY;  Surgeon: Marty Heck, MD;  Location: [facility];  Service: Cardiovascular;  Laterality: Left;  ARM FISTULA  . SPINE SURGERY    . THROMBECTOMY OF BYPASS GRAFT FEMORAL- POPLITEAL ARTERY Right 11/13/2019   Procedure: THROMBECTOMY OF BYPASS GRAFT OF LEG;  Surgeon: Marty Heck, MD;  Location: [facility];  Service: Vascular;  Laterality: Right;     Prior to Admission medications   Medication Sig Start Date End Date Taking? Authorizing Provider  aspirin EC 81 MG tablet Take 1 tablet (81 mg total) by mouth daily with breakfast. 03/19/19  Yes Emokpae, Courage, MD  Cholecalciferol (VITAMIN D3) 50 MCG (2000 UT) TABS Take 2,000 Units by mouth 4 (four) times a week.   Yes [provider]  glipiZIDE (GLUCOTROL) 5 MG tablet Take 5 mg by mouth 2 (two) times daily. 10/19/19  Yes [provider]  labetalol (NORMODYNE) 200 MG tablet Take 2 tablets (400 mg total) by mouth 2 (two) times daily. Patient taking differently: Take 200 mg by mouth 2 (two) times  daily.  03/19/19  Yes Roxan Hockey, MD  levothyroxine (SYNTHROID) 50 MCG tablet Take 1 tablet (50 mcg total) by mouth daily before breakfast. 03/19/19  Yes Emokpae, Courage, MD  acetaminophen (TYLENOL) 325 MG tablet Take 2 tablets (650 mg total) by mouth every 6 (six) hours as needed for mild pain (or Fever >/= 101). 03/19/19   Roxan Hockey, MD    Inpatient Medications: Scheduled Meds: . chlorhexidine  15 mL Mouth Rinse BID  . Chlorhexidine Gluconate Cloth  6 each Topical Q0600  . insulin aspart  0-15 Units Subcutaneous Q4H  . mouth rinse  15 mL Mouth Rinse Q2H  . pantoprazole (PROTONIX) IV  40 mg Intravenous QHS  . sodium bicarbonate  50 mEq Intravenous Once  . sodium chloride flush  10-40 mL Intracatheter Q12H   Continuous Infusions: .  prismasol BGK 4/2.5 500 mL/hr at 11/13/19 0525  . sodium chloride 500 mL/hr at 11/11/19 1250  . sodium chloride    . sodium chloride    . sodium chloride 20 mL/hr  at 11/13/19 0700  . amiodarone 30 mg/hr (11/13/19 0700)  . cefTRIAXone (ROCEPHIN)  IV Stopped (11/13/2019 2023)  . dexmedetomidine    . fentaNYL infusion INTRAVENOUS 150 mcg/hr (11/13/19 0700)  . metronidazole Stopped (11/13/19 0407)  . norepinephrine (LEVOPHED) Adult infusion 4 mcg/min (11/13/19 0700)  . prismasol BGK 0/2.5 300 mL/hr at 12/01/2019 1900  . prismasol BGK 4/2.5 1,500 mL/hr at 11/13/19 0531  . TPN ADULT (ION) 35 mL/hr at 11/13/19 0700  . TPN ADULT (ION)    . vasopressin (PITRESSIN) infusion - *FOR SHOCK* Stopped (11/11/2019 2725)   PRN Meds: sodium chloride, sodium chloride, sodium chloride, Place/Maintain arterial line **AND** sodium chloride, acetaminophen **OR** [DISCONTINUED] acetaminophen, alteplase, fentaNYL, fentaNYL (SUBLIMAZE) injection, fentaNYL (SUBLIMAZE) injection, heparin, heparin, lidocaine (PF), lidocaine-prilocaine, midazolam, midazolam, ondansetron, pentafluoroprop-tetrafluoroeth, phenol, sodium chloride flush  Allergies:   No Known Allergies  Social  History:   Social History   Socioeconomic History  . Marital status: Married    Spouse name: Not on file  . Number of children: Not on file  . Years of education: Not on file  . Highest education level: Not on file  Occupational History  . Not on file  Tobacco Use  . Smoking status: Current Every Day Smoker    Packs/day: 0.50    Years: 40.00    Pack years: 20.00    Types: Cigarettes    Last attempt to quit: 07/24/2018    Years since quitting: 1.3  . Smokeless tobacco: Never Used  Substance and Sexual Activity  . Alcohol use: Yes    Alcohol/week: 1.0 standard drinks    Types: 1 Cans of beer per week    Comment: 11/04/2019: per patient, "probably one can of beer  a week  . Drug use: No  . Sexual activity: Not on file  Other Topics Concern  . Not on file  Social History Narrative  . Not on file   Social Determinants of Health   Financial Resource Strain: Low Risk   . Difficulty of Paying Living Expenses: Not very hard  Food Insecurity: No Food Insecurity  . Worried About Charity fundraiser in the Last Year: Never true  . Ran Out of Food in the Last Year: Never true  Transportation Needs: No Transportation Needs  . Lack of Transportation (Medical): No  . Lack of Transportation (Non-Medical): No  Physical Activity: Insufficiently Active  . Days of Exercise per Week: 3 days  . Minutes of Exercise per Session: 30 min  Stress: No Stress Concern Present  . Feeling of Stress : Only a little  Social Connections: Slightly Isolated  . Frequency of Communication with Friends and Family: More than three times a week  . Frequency of Social Gatherings with Friends and Family: More than three times a week  . Attends Religious Services: 1 to 4 times per year  . Active Member of Clubs or Organizations: No  . Attends Archivist Meetings: Never  . Marital Status: Married  Human resources officer Violence: Not At Risk  . Fear of Current or Ex-Partner: No  . Emotionally Abused: No    . Physically Abused: No  . Sexually Abused: No    Family History:   Family History  Problem Relation Age of Onset  . Hypertension Mother   . Deep vein thrombosis Father   . Hypertension Father   . Hypertension Sister   . Hypertension Brother    Family Status:  Family Status  Relation Name Status  . Mother  Deceased  at age 16  . Father  Deceased at age 84  . Sister  Alive  . Brother  Alive    ROS:  Please see the history of present illness.  All other ROS reviewed and negative.     Physical Exam/Data:   Vitals:   11/13/19 0630 11/13/19 0645 11/13/19 0700 11/13/19 0729  BP: (!) 100/58 95/64 (!) 110/56   Pulse: (!) 105 (!) 107 (!) 104   Resp: 20 (!) 22 (!) 22   Temp: (!) 96.8 F (36 C) (!) 96.8 F (36 C) (!) 96.8 F (36 C)   TempSrc:      SpO2: 99% 99% 99% 99%  Weight:      Height:        Intake/Output Summary (Last 24 hours) at 11/13/2019 0750 Last data filed at 11/13/2019 0700 Gross per 24 hour  Intake 4420.23 ml  Output 3288 ml  Net 1132.23 ml    Last 3 Weights 11/13/2019 11/15/2019 11/11/2019  Weight (lbs) 180 lb 8.9 oz 174 lb 2.6 oz 175 lb 7.8 oz  Weight (kg) 81.9 kg 79 kg 79.6 kg     Body mass index is 26.66 kg/m.   General:  Well nourished, well developed, male intubated HEENT: ET tube in place Lymph: no adenopathy Neck: JVD -not seen elevated, unable to assess accurately due to lines and tubes Endocrine:  No thryomegaly Vascular: No carotid bruits; unable to obtain extremity pulses secondary to decreased pulses in his lower extremities and old/new dialysis access in his upper extremities Cardiac:  normal S1, S2; slightly rapid and irregular rate and rhythm; no murmur Lungs:  clear anteriorly, no wheezing, rhonchi or rales  Abd: soft, dressing and wound VAC in place, not disturbed Ext: 1+ edema, some blistering seen on his right lower extremity Musculoskeletal:  No deformities, BUE and BLE strength not tested Skin: warm and dry  Neuro: Sedated and  intubated on the vent   EKG:  The EKG was personally reviewed and demonstrates: 5/4 ECG is sinus rhythm Telemetry:  Telemetry was personally reviewed and demonstrates: Sinus rhythm over the last 24 hours with patient converting to atrial fib RVR at approximately 430 this morning   CV studies:   ECHO: 09/28/2019 1. Left ventricular ejection fraction, by estimation, is 60 to 65%. The  left ventricle has normal function. The left ventricle has no regional  wall motion abnormalities. There is mild left ventricular hypertrophy.  Left ventricular diastolic parameters  are consistent with Grade I diastolic dysfunction (impaired relaxation).  2. Right ventricular systolic function is normal. The right ventricular  size is normal.  3. The mitral valve is normal in structure. No evidence of mitral valve  regurgitation. No evidence of mitral stenosis.  4. The aortic valve is normal in structure. Aortic valve regurgitation is  not visualized. No aortic stenosis is present.  5. The inferior vena cava is normal in size with greater than 50%  respiratory variability, suggesting right atrial pressure of 3 mmHg.     Laboratory Data:   Chemistry Recent Labs  Lab 11/22/2019 0340 12/05/2019 0340 11/07/2019 5361 11/25/2019 0950 11/13/2019 1304 11/07/2019 1644 11/13/19 0307  NA 138   < > 137   < > 138 136 138  K 4.3   < > 4.1   < > 3.5 3.9 4.1  CL 102   < > 99  --   --  101 103  CO2 25  --   --   --   --  23 24  GLUCOSE 236*   < > 243*  --   --  165* 182*  BUN 28*   < > 31*  --   --  32* 26*  CREATININE 3.27*   < > 3.50*  --   --  3.18* 2.70*  CALCIUM 7.3*  --   --   --   --  8.1* 8.2*  GFRNONAA 19*  --   --   --   --  19* 23*  GFRAA 21*  --   --   --   --  22* 27*  ANIONGAP 11  --   --   --   --  12 11   < > = values in this interval not displayed.    Lab Results  Component Value Date   ALT 816 (H) 11/11/2019   AST 2,371 (H) 11/11/2019   ALKPHOS 75 11/11/2019   BILITOT 1.7 (H) 11/11/2019     Hematology Recent Labs  Lab 11/11/19 0221 11/11/19 0237 11/17/2019 0340 11/30/2019 0812 11/24/2019 0950 12/06/2019 1304 11/13/19 0307  WBC 5.8  --  5.6  --   --   --  6.0  RBC 3.73*  --  3.21*  --   --   --  3.53*  HGB 10.2*   < > 8.9*   < > 8.2* 8.2* 9.7*  HCT 31.7*   < > 27.2*   < > 24.0* 24.0* 30.3*  MCV 85.0  --  84.7  --   --   --  85.8  MCH 27.3  --  27.7  --   --   --  27.5  MCHC 32.2  --  32.7  --   --   --  32.0  RDW 17.1*  --  17.4*  --   --   --  17.4*  PLT 74*  --  53*  --   --   --  59*   < > = values in this interval not displayed.   Cardiac Enzymes High Sensitivity Troponin:   Recent Labs  Lab 11/09/19 0959 11/09/19 1421  TROPONINIHS 23* 93*      BNP Recent Labs  Lab 11/09/19 1025  BNP 271.6*    DDimer No results for input(s): DDIMER in the last 168 hours. TSH: No results found for: TSH Lipids: Lab Results  Component Value Date   TRIG 147 11/11/2019   HgbA1c: Lab Results  Component Value Date   HGBA1C 6.7 (H) 11/13/2019   Magnesium:  Magnesium  Date Value Ref Range Status  11/13/2019 2.5 (H) 1.7 - 2.4 mg/dL Final    Comment:    Performed at [facility], [place] [address], [place], [place] [address]     Radiology/Studies:  DG Abd 1 View  Result Date: 11/09/2019 CLINICAL DATA:  Abdominal distension EXAM: ABDOMEN - 1 VIEW COMPARISON:  09/08/2019 FINDINGS: NG tube extends into the stomach. There is gas and stool in the rectosigmoid colon which is somewhat featureless. No small bowel obstruction identified. IMPRESSION: NG tube in stomach.  Prominent gas and stool-filled sigmoid colon. Electronically Signed   By: Suzy Bouchard M.D.   On: 11/09/2019 08:54   DG CHEST PORT 1 VIEW  Result Date: 11/11/2019 CLINICAL DATA:  Intubation. EXAM: PORTABLE CHEST 1 VIEW COMPARISON:  12/04/2019. FINDINGS: Endotracheal tube, NG tube, right IJ sheath, left subclavian line in stable position. Heart size stable. Bibasilar infiltrates/edema noted on today's exam.  Bibasilar atelectasis. Small right pleural effusion. No pneumothorax.  Degenerative change thoracic spine. IMPRESSION: 1. Bibasilar infiltrates/edema noted on today's exam. Bibasilar atelectasis. Small right pleural effusion. 2.  Lines and tubes in stable position. Electronically Signed   By: Marcello Moores  Register   On: 11/11/2019 08:10   DG Chest Port 1 View  Result Date: 12/06/2019 CLINICAL DATA:  Respiratory failure EXAM: PORTABLE CHEST 1 VIEW COMPARISON:  11/09/2019 FINDINGS: Overlying defibrillator pads. Endotracheal tube terminates approximately 3.2 cm above the carina. Enteric tube terminates within the stomach. Right IJ central venous catheter and left subclavian central venous catheter tips terminate within the SVC. Left subclavian catheter no longer appears kinked. Stable heart size. Low lung volumes. Improving aeration within the lung bases. No pneumothorax. IMPRESSION: 1. Left subclavian catheter no longer appears kinked. Otherwise, stable lines and tubes. 2. Low lung volumes.  Improving aeration within the lung bases. Electronically Signed   By: Davina Poke D.O.   On: 11/07/2019 09:43   DG CHEST PORT 1 VIEW  Result Date: 11/09/2019 CLINICAL DATA:  Central line placement EXAM: PORTABLE CHEST 1 VIEW COMPARISON:  Nov 09, 2019 FINDINGS: The endotracheal tube terminates above the carina. There is a new left-sided central venous catheter with tip terminating over the left brachiocephalic vein. The catheter appears kinked in the midportion. There is a right-sided central venous catheter that is stable from prior study. The enteric tube terminates over the gastric body. There is a persistent opacity at the right lung base consistent with a right-sided pleural effusion with associated airspace disease. There is a small left-sided pleural effusion. The cardiac silhouette is unchanged. IMPRESSION: 1. New left subclavian approach central venous catheter. This catheter appears kinked in the midportion. There is  no left-sided pneumothorax. 2. Remaining lines and tubes as above. 3. Persistent bilateral pleural effusions, right greater than left, with adjacent airspace disease. Electronically Signed   By: Constance Holster M.D.   On: 11/09/2019 17:16   DG CHEST PORT 1 VIEW  Result Date: 11/09/2019 CLINICAL DATA:  Intubation EXAM: PORTABLE CHEST 1 VIEW COMPARISON:  Portable exam 1407 hours compared to 11/09/2019 FINDINGS: External pacing leads project over chest. Tip of endotracheal tube projects 3.9 cm above carina. Nasogastric tube extends into abdomen. Normal heart size, mediastinal contours, and pulmonary vascularity. RIGHT pleural effusion and basilar atelectasis. Mild LEFT basilar atelectasis. Upper lungs clear. No pneumothorax or acute osseous findings. IMPRESSION: RIGHT pleural effusion and bibasilar atelectasis RIGHT greater than LEFT. Electronically Signed   By: Lavonia Dana M.D.   On: 11/09/2019 14:24   DG CHEST PORT 1 VIEW  Result Date: 11/09/2019 CLINICAL DATA:  Abnormal respiration. EXAM: PORTABLE CHEST 1 VIEW COMPARISON:  12/03/2019. FINDINGS: NG tube and right IJ sheath in stable position. Stable cardiomegaly. No pulmonary venous congestion. Low lung volumes with bibasilar atelectasis again noted. Tiny left pleural effusion cannot be excluded. No pneumothorax. Degenerative change thoracic spine. IMPRESSION: 1.  NG tube right IJ sheath in stable position. 2.  Stable cardiomegaly. 3. Low lung volumes with bibasilar atelectasis again noted. Tiny left pleural effusion cannot be excluded. Electronically Signed   By: Marcello Moores  Register   On: 11/09/2019 08:44   CT Angio Chest/Abd/Pel for Dissection W and/or W/WO  Result Date: 11/09/2019 : Alerts CLINICAL DATA:  Cardiac arrest. Recent aortobifem bypass. EXAM: CT ANGIOGRAPHY CHEST, ABDOMEN AND PELVIS TECHNIQUE: Multidetector CT imaging through the chest, abdomen and pelvis was performed using the standard protocol during bolus administration of intravenous  contrast. Multiplanar reconstructed images and MIPs were obtained and reviewed to evaluate the vascular anatomy.  CONTRAST:  181mL OMNIPAQUE IOHEXOL 350 MG/ML SOLN COMPARISON:  09/16/2019 FINDINGS: CTA CHEST FINDINGS Cardiovascular: Right IJ central venous catheter extends to the mid SVC. Heart size normal. Fair contrast opacification of pulmonary arterial branches. No convincing filling defects to suggest PE. Mild calcifications in the LAD. Extensive atheromatous plaque through the thoracic aorta. Peripherally calcified ductus diverticulum. Eccentric nonocclusive mural thrombus throughout the descending segment. No aneurysm, dissection or stenosis. Classic 3 vessel brachiocephalic arterial origin anatomy without proximal stenosis. Mediastinum/Nodes: No hilar or mediastinal adenopathy. Endotracheal tube in good position. Lungs/Pleura: Small left pleural effusion and trace right effusion. No pneumothorax. Consolidation/atelectasis of the right middle and lower lobes with probable occlusive central bronchial secretions. Dependent atelectasis in the posterior right upper lobe and posterior left lower lobe. Musculoskeletal: Anterior vertebral endplate spurring at multiple levels in the mid and lower thoracic spine. No fracture or worrisome bone lesion. Review of the MIP images confirms the above findings. CTA ABDOMEN AND PELVIS FINDINGS VASCULAR Aorta: Eccentric nonocclusive mural thrombus in the native suprarenal and juxtarenal segments. Interval infrarenal aortobifem graft repair, patent. Coarse calcifications in the collapsed native aneurysm sac. Celiac: Patent without evidence of aneurysm, dissection, vasculitis or significant stenosis. SMA: Mild nonocclusive plaque. Classic distal branch anatomy. Renals: Duplicated left, inferior dominant with short-segment ostial stenosis. Single right, with partially calcified proximal plaque, no high-grade stenosis. IMA: Not opacified. Inflow: Both limbs of the aortobifem graft  widely patent through the distal anastomoses. Interval long segment occlusion of right common iliac artery extending into internal and external iliac arteries, with distal reconstitution. Left common iliac is now occluded through its length, with collateral reconstitution of atheromatous nondilated external and internal iliac arteries. Patent but atheromatous proximal visualized lower extremity arterial outflow. Veins: No obvious venous abnormality within the limitations of this arterial phase study. Review of the MIP images confirms the above findings. NON-VASCULAR Hepatobiliary: Heterogenous parenchymal enhancement presumably related to early arterial phase timing of the scan, with limited portal vein enhancement. Gallbladder unremarkable. Pancreas: Unremarkable. No pancreatic ductal dilatation or surrounding inflammatory changes. Spleen: Heterogenous enhancement presumably related to early scan timing. Adrenals/Urinary Tract: Bilateral adrenal hypertrophy. No renal mass or hydronephrosis. Foley catheter decompresses urinary bladder. Stomach/Bowel: Orogastric tube extends into the stomach which is partially distended by gas and fluid. Fluid distention of the duodenum. Small bowel is decompressed. Proximal colon is nondistended with a few scattered diverticula. Sigmoid colon is mildly distended proximally. Lymphatic: No definite adenopathy. Reproductive: Unremarkable Other: Small amount of pelvic and abdominal ascites. Left retroperitoneal poorly marginated fluid. A few bubbles of intraperitoneal gas presumably postop. Musculoskeletal: Interval midline anterior abdominal wall incision. Advanced degenerative disc disease L5-S1. No fracture or worrisome bone lesion. Review of the MIP images confirms the above findings. IMPRESSION: 1. Negative for acute PE or thoracic aortic dissection. 2. Interval infrarenal aortobifem graft repair, patent. 3. 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 4. Small amount of pelvic and abdominal ascites. 5. Small left and trace right pleural effusions. 6. Right middle and lower lobe consolidation/atelectasis with probable occlusive central bronchial secretions. 7. Coronary artery disease. Aortic Atherosclerosis (ICD10-I70.0). Electronically Signed   By: Lucrezia Europe M.D.   On: 11/09/2019 16:03    Assessment and Plan:   1.  Persistent atrial fibrillation, RVR -He went back into atrial fibrillation, despite being on IV amiodarone -However, his heart rate is appropriate for his severity of illness -We can rebolus the amiodarone and increase the drip to 60 mg an hour if needed,  will leave to MD -LFTs are significantly abnormal, TSH not checked. -Follow LFTs and check TSH  Otherwise, per Dr. Carlis Abbott and consultants Active Problems:   S/P AAA repair   AAA (abdominal aortic aneurysm) [facility])   For questions or updates, please contact [facility] Please consult [email or web address] for contact info under Cardiology/STEMI.   Jonetta Speak, PA-C  11/13/2019 7:50 AM   Cardiology Attending  Patient seen and examined. Agree with the findings as noted above. The patient remains critically ill and has reverted back to atrial fib with a RVR in the 100-115 range. His bp remains acceptable. Note he briefly was in atrial fib a day or two ago. He has plans to revisit the OR tomorrow. His bp is tolerating amiodarone. At this point I would not aggressively try to get him back to NSR (ie would not attempt to cardiovert) as he will surely go back to atrial fib. If his rates go back up, in addition to amio, I would suggest a single dose of 0.25 mg of IV digoxin. We will follow with you.   Mikle Bosworth.D.

## 2019-11-13 NOTE — Progress Notes (Signed)
NAME:  Juan Bass, MRN:  [id number], DOB:  09-19-1950, LOS: 5 ADMISSION DATE:  12/04/2019, CONSULTATION DATE: 11/09/2019 REFERRING MD: Vascular surgery, CHIEF COMPLAINT: Hypoxia hypotension  Brief History   69 year old end-stage renal disease status post AAA repair 11/20/2019 with increasing work of breathing and hypotension following surgery.  History of present illness   69 year old male with known history of end-stage renal disease with a right bicep graft along with hypertension hypothyroidism COPD with chronic long-term tobacco abuse.  Known 6 cm aortic abdominal aneurysm for which he underwent a AAA repair 11/07/2019 along with aortobifem for his vascular disease.  He was extubating in the PACU on 11/20/2019 was not initially hypertensive and required a labetalol drip underwent hemodialysis became hypotensive.  Initially placed on Neo-Synephrine drip did not elevated to a Levophed drip.  Also noted to have some hypoxia with increasing oxygen needs chest x-ray was remarkable for hypoventilation.  Pulmonary critical care was called to bedside for possible interventions.  He is able to vocalize he is not using accessory muscles at the time of examination.  But ends this weakened state and inability to take deep breaths and underlying COPD he may require intubation in the near future.  Past Medical History  End-stage renal disease COPD Diabetes Hypertension Hypothyroidism  Significant Hospital Events   11/09/2019 found to be hypoxic  Consults:  Nephro Surgery Cardiology Vascular PCCM  Procedures:  11/07/2019 status post aortobifem and AAA repair  5/7 REOPENING LAPAROTOMY COLOSTOMY WITH ABDOMINAL CLOSURE IRRIGATION AND DEBRIDEMENT OF RIGHT LEG FASCIOTOMY CLOSURE RIGHT LEG Application Of Wound Vac RIGHT LEG Small Bowel Resection with Rectal Resection  Significant Diagnostic Tests:  11/09/2019 chest x-ray with decreased excursion with bilateral atelectasis  CTA C/A/P  5/4 IMPRESSION: 1. Negative for acute PE or thoracic aortic dissection. 2. Interval infrarenal aortobifem graft repair, patent. 3. 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 4. Small amount of pelvic and abdominal ascites. 5. Small left and trace right pleural effusions. 6. Right middle and lower lobe consolidation/atelectasis with probable occlusive central bronchial secretions. 7. Coronary artery disease.  Micro Data:  None  Antimicrobials:  5/4>> Ceftriaxone 5/4>> Metronidazole  Interim history/subjective:  No events, remains heavily sedated on vent.  Objective   Blood pressure (!) 161/78, pulse (!) 109, temperature (!) 96.6 F (35.9 C), resp. rate (!) 22, height 5\' 9"  (1.753 m), weight 81.9 kg, SpO2 100 %. CVP:  [4 mmHg-16 mmHg] 5 mmHg  Vent Mode: PRVC FiO2 (%):  [40 %-50 %] 40 % Set Rate:  [22 bmp] 22 bmp Vt Set:  [560 mL] 560 mL PEEP:  [8 cmH20-10 cmH20] 8 cmH20 Plateau Pressure:  [19 cmH20-27 cmH20] 22 cmH20   Intake/Output Summary (Last 24 hours) at 11/13/2019 1136 Last data filed at 11/13/2019 1100 Gross per 24 hour  Intake 2854.71 ml  Output 3400 ml  Net -545.29 ml   Filed Weights   11/11/19 0600 11/30/2019 0500 11/13/19 0500  Weight: 79.6 kg 79 kg 81.9 kg    Examination: GEN: ill appearing man on vent HEENT: ETT in place, mild secretions CV: Tachycardic, irregular, ext warm PULM: scattered rhonci, tachypneic GI: midline incision with abthera and JP drain, ostomy in place EXT: RLE is mottled and gangrenous, vac in place NEURO: opens eyes to stimulation, cannot get him to follow commands PSYCH: RASS -4 SKIN: Mild pallor  Labs okay LFTs improving   Resolved Hospital Problem list     Assessment & Plan:  Respiratory failure in  setting of ischemic bowel and ischemic leg- on vent, s/p SB resection and ostomy creation as well as RLE thrombectomy and fasciotomy. Plans to return to OR tomorrow for repeat  ex lap and potential closure by surgery. - continue vent support, VAP prevention bundle - SBT on hold until OR plans finished - General surgery and vascular following  S/P open AAA repair and aortobifemoral bypass- vascular following closely    ESRD, shock- on CRRT with nephrology following  Persistent Afib- per cardiology  Hypothyroid- continue synthroid  Worsening thrombocytopenia- monitor, transfusion threshold per primary  Best practice:  Diet: NPO Pain/Anxiety/Delirium protocol (if indicated): in place VAP protocol (if indicated): in place DVT prophylaxis: per primary GI prophylaxis: PPI Glucose control: SSI Mobility: BR Code Status: full Family Communication: per primary Disposition: ICU pending vent liberation    The patient is critically ill with multiple organ systems failure and requires high complexity decision making for assessment and support, frequent evaluation and titration of therapies, application of advanced monitoring technologies and extensive interpretation of multiple databases. Critical Care Time devoted to patient care services described in this note independent of APP/resident time (if applicable)  is 34 minutes.   Erskine Emery MD [facility] 11/13/2019 2:22 PM Personal pager: [phone] If unanswered, please page CCM On-call: [phone]

## 2019-11-13 NOTE — Progress Notes (Signed)
PHARMACY - TOTAL PARENTERAL NUTRITION CONSULT NOTE  Indication:  Bowel ischemia  Patient Measurements: Height: 5\' 9"  (175.3 cm) Weight: 81.9 kg (180 lb 8.9 oz) IBW/kg (Calculated) : 70.7 TPN AdjBW (KG): 75 Body mass index is 26.66 kg/m.  Assessment:   54 YOM presented on 12/05/2019 for AAA repair with aortobifemoral bypass.  Post-op course complicated by increased WOB, hypotension and cardiac arrest.  He was taken back to the OR on 11/15/2019 for left colectomy (left in discontinuity) and VAC placement for ischemic left colon, and reopening of recent laparotomy, RLE thrombectomy and 4 compartment fasciotomies.  Pharmacy consulted to manage TPN as no oral/enteral nutrition anticipated within the next several days.  Patient is intubated and sedated - unsure of nutritional status PTA.  Glucose / Insulin: hx DM on glipizide PTA - CBGs 77 - 243 better controlled post-op, on SSI with 11 units used yesterday Electrolytes: K down to 4.1 (4K bath through CRRT), low iCa, Phos down to 3, Mag at 2.5 Renal: ESRD, last HD session 5/3.  CRRT started 5/4.  SCr down 2.7, BUN 26 LFTs / TGs: AST/ALT 2371/816, tbili down to 1.7, TG WNL Prealbumin / albumin: albumin 2.6, amylase 258, prealbumin slightly low at 15.5 Intake / Output; MIVF: no UOP, NG O/P 659mL, drain 837mL  GI Imaging: none since TPN consult Surgeries / Procedures:  5/7 - closure RLE lateral fasciotomy and partial closure with VAC placement on medial RLE fasciotomy 5/7 -  Small bowel resection, additional rectum resection, end transverse colostomy  Central access: CVC double lumen placed 12/02/2019 TPN start date: 11/11/19  Nutritional Goals (RD rec on 5/5): 1875-2250 kCal, 130-150gm protein per day  Current Nutrition:  TPN  Plan:  Increase concentrated TPN rate to goal of 70 ml/hr (goal rate of 70 ml/hr).  Since CBGs are better controlled post-op. TPN provides 150g AA and 386g CHO for a total of 1912 kCal, meeting ~100% of patient needs Hold ILE  for the first 7 days of TPN in critically ill patients per ASPEN guidelines.  Add SMOFlipid on 11/15/19. Electrolytes in TPN: standard Na, increase Ca, Cl:Ac 1:2 - may need to add other lytes as CRRT continues to clear them Daily multivitamin and trace elements in TPN.  No chromium with ESRD. Monitor on moderate SSI Q4H - assess response to SSI and hold off on adding insulin to TPN F/U AM labs, CBGs  Alanda Slim, PharmD, [facility] Clinical Pharmacist Please see AMION for all Pharmacists' Contact Phone Numbers 11/13/2019, 7:18 AM

## 2019-11-13 NOTE — Progress Notes (Signed)
[name], male   DOB: Apr 28, 1951, 69 y.o.   MRN: [id number] S: Developed A fib with RVR and hypotension.  Cardiology consulted and started on amiodarone.  Remains in and out of A fib with RVR and drops in BP.  Weaning levophed as tolerated. O:BP 105/62   Pulse (!) 109   Temp (!) 96.6 F (35.9 C)   Resp 19   Ht 5\' 9"  (1.753 m)   Wt 81.9 kg   SpO2 99%   BMI 26.66 kg/m   Intake/Output Summary (Last 24 hours) at 11/13/2019 1033 Last data filed at 11/13/2019 1000 Gross per 24 hour  Intake 3018.3 ml  Output 3333 ml  Net -314.7 ml   Intake/Output: I/O last 3 completed shifts: In: 5706.4 [I.V.:3234; Blood:1023; IV Piggyback:1449.4] Out: 4450 [Emesis/NG output:700; Drains:1200; ZYSAY:3016; Blood:175]  Intake/Output this shift:  Total I/O In: 271.1 [I.V.:271.1] Out: 220 [Other:220] Weight change: 2.9 kg Gen: intuabed and sedated CVS: IRR IRR Resp: CTA Abd: wound vac in place, hypoactive BS Ext: ischemic changes bilaterally R>L with bullae on right foot, LUE AVF +T/B  Recent Labs  Lab 11/09/19 0145 11/09/19 0759 11/23/2019 0531 11/21/2019 0541 11/15/2019 1240 11/09/2019 1248 11/13/2019 1613 11/24/2019 1613 11/11/19 0221 11/11/19 0237 11/11/19 1500 11/11/19 1500 11/11/19 2110 12/01/2019 0340 11/09/2019 0812 11/30/2019 0950 12/05/2019 1304 11/11/2019 1644 11/13/19 0307 11/13/19 0932  NA 139   < > 139   < > 139   < > 140   < > 137   < > 136  --   --  138 137 138 138 136 138  --   K 3.5   < > 6.2*   < > 6.4*   < > 5.9*   < > 5.1   < > 4.4   < > 4.7 4.3 4.1 4.0 3.5 3.9 4.1  --   CL 104   < > 103   < > 103   < > 105  --  100  --  102  --   --  102 99  --   --  101 103  --   CO2 23   < > 20*   < > 19*  --  21*  --  23  --  22  --   --  25  --   --   --  23 24  --   GLUCOSE 77   < > 85   < > 88   < > 104*  --  114*  --  112*  --   --  236* 243*  --   --  165* 182*  --   BUN 24*   < > 33*   < > 39*   < > 37*  --  31*  --  27*  --   --  28* 31*  --   --  32* 26*  --   CREATININE  5.14*   < > 5.08*   < > 5.17*   < > 4.74*  --  4.08*  --  3.74*  --   --  3.27* 3.50*  --   --  3.18* 2.70*  --   ALBUMIN 2.8*   < > 3.1*   < > 2.6*   < > 2.8*  --  2.7*  --  2.8*  --   --  2.6*  --   --   --  2.6* 2.5* 2.6*  CALCIUM 7.8*   < > 7.4*   < >  7.1*  --  7.0*  --  6.8*  --  6.8*  --   --  7.3*  --   --   --  8.1* 8.2*  --   PHOS 2.9   < > 7.6*  --   --   --  8.7*  --  7.8*  --  7.0*  --   --  4.8*  --   --   --  4.7* 3.0  --   AST 303*  --  1,810*  --  2,133*  --   --   --  2,371*  --   --   --   --   --   --   --   --   --   --  1,659*  ALT 259*  --  1,016*  --  848*  --   --   --  816*  --   --   --   --   --   --   --   --   --   --  523*   < > = values in this interval not displayed.   Liver Function Tests: Recent Labs  Lab 11/23/2019 1240 11/09/2019 1613 11/11/19 0221 11/11/19 1500 12/01/2019 1644 11/13/19 0307 11/13/19 0932  AST 2,133*  --  2,371*  --   --   --  1,659*  ALT 848*  --  816*  --   --   --  523*  ALKPHOS 52  --  75  --   --   --  75  BILITOT 2.0*  --  1.7*  --   --   --  1.8*  PROT 4.5*  --  5.2*  --   --   --  5.0*  ALBUMIN 2.6*   < > 2.7*   < > 2.6* 2.5* 2.6*   < > = values in this interval not displayed.   Recent Labs  Lab 11/09/19 0145  AMYLASE 258*   No results for input(s): AMMONIA in the last 168 hours. CBC: Recent Labs  Lab 11/29/2019 0531 12/01/2019 0541 11/21/2019 1240 11/25/2019 1248 11/11/19 0221 11/11/19 0237 11/09/2019 0340 11/20/2019 0812 12/04/2019 0950 11/15/2019 1304 11/13/19 0307  WBC 6.3   < > 3.9*   < > 5.8  --  5.6  --   --   --  6.0  NEUTROABS 5.1  --   --   --  5.2  --   --   --   --   --   --   HGB 9.6*   < > 10.4*   < > 10.2*   < > 8.9*   < > 8.2* 8.2* 9.7*  HCT 29.7*   < > 31.6*   < > 31.7*   < > 27.2*   < > 24.0* 24.0* 30.3*  MCV 85.1  --  84.9  --  85.0  --  84.7  --   --   --  85.8  PLT 89*   < > 89*   < > 74*  --  53*  --   --   --  59*   < > = values in this interval not displayed.   Cardiac Enzymes: No results for  input(s): CKTOTAL, CKMB, CKMBINDEX, TROPONINI in the last 168 hours. CBG: Recent Labs  Lab 11/24/2019 1556 11/24/2019 2042 11/13/19 0025 11/13/19 0416 11/13/19 0826  GLUCAP 163* 77 70 83 196*    Iron Studies: No results for input(s):  IRON, TIBC, TRANSFERRIN, FERRITIN in the last 72 hours. Studies/Results: No results found. . chlorhexidine  15 mL Mouth Rinse BID  . Chlorhexidine Gluconate Cloth  6 each Topical Q0600  . insulin aspart  0-15 Units Subcutaneous Q4H  . mouth rinse  15 mL Mouth Rinse Q2H  . pantoprazole (PROTONIX) IV  40 mg Intravenous QHS  . sodium bicarbonate  50 mEq Intravenous Once  . sodium chloride flush  10-40 mL Intracatheter Q12H    BMET    Component Value Date/Time   NA 138 11/13/2019 0307   K 4.1 11/13/2019 0307   CL 103 11/13/2019 0307   CO2 24 11/13/2019 0307   GLUCOSE 182 (H) 11/13/2019 0307   BUN 26 (H) 11/13/2019 0307   CREATININE 2.70 (H) 11/13/2019 0307   CALCIUM 8.2 (L) 11/13/2019 0307   GFRNONAA 23 (L) 11/13/2019 0307   GFRAA 27 (L) 11/13/2019 0307   CBC    Component Value Date/Time   WBC 6.0 11/13/2019 0307   RBC 3.53 (L) 11/13/2019 0307   HGB 9.7 (L) 11/13/2019 0307   HCT 30.3 (L) 11/13/2019 0307   PLT 59 (L) 11/13/2019 0307   MCV 85.8 11/13/2019 0307   MCH 27.5 11/13/2019 0307   MCHC 32.0 11/13/2019 0307   RDW 17.4 (H) 11/13/2019 0307   LYMPHSABS 0.4 (L) 11/11/2019 0221   MONOABS 0.2 11/11/2019 0221   EOSABS 0.0 11/11/2019 0221   BASOSABS 0.0 11/11/2019 0221    Assessment/Plan:  1. Atrial fibrillation with RVR and hypotension- started on amiodarone per Cardiology. 2. Shock with multisystem organ failure following AAA repair- due to ischemic/dead bowel s/p emergent exp lap and left colectomy5/5/21. 1. Will keep even with CRRT as he is on pressors and has had ongoing ischemic bowel. 3. AAA- s/p repair 12/06/2019 4. ESRD-currently on CRRT due to the development of shock with MOS failure.  1. Was changed from all 0K/2.5Ca to  4K/2.5 Ca for dialysate and pre-filter and 0K/2.5 Ca for post filter replacement fluids. 2. Will continue with the above combination and follow K levels later today  3. Continue CRRT with dialysate 4K/2.5Ca, pre-filter 4K/2.5 Ca, post-filter 0K/2.5 Ca 4. Will keep even with CRRT due to ongoing bowel ischemia. 5. PAD- ischemic BLE, s/p thrombectomy of right. Plan per VVS 6. Hyperkalemia-improved with CRRT and surgery. 7. VDRF/SOB-intubated 11/09/19 after cardiac arrest. His earlier SOB was likely due to ischemic bowel and the development of lactic acidosis.PCCM following now.  8. Anemia- s/p 3 units of PRBC's. Follow H/H 9. HTN-meds on hold due to shock.Continue to follow. 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 Thrombocytopenia - will need to follow Donetta Potts, MD [facility] [phone]

## 2019-11-13 NOTE — Progress Notes (Addendum)
CCM called regarding shivering/shaking, not following commands; sustained MAP 58-59 on levo @ 4; and change in lung sounds from clear to crackles and wheezing. Verbal orders to keep using levo for BP to keep MAP greater than 60. No new orders given. Will continue to monitor closely.

## 2019-11-13 NOTE — Progress Notes (Signed)
[facility] Progress Note Patient Name: SHRAVAN SALAHUDDIN DOB: Mar 26, 1951 MRN: [id number]   Date of Service  11/13/2019  HPI/Events of Note  RN notified me that patient has short runs of A fib and when HR is > 120, his BP drops He has not needed more than 5 mic/min of levophed, that is the dose he is on right now Hb is good, K/mag are acceptable No fluids being pulled via CRRT Remains sedated on fentanyl, no distress on camera Has intermittent A fib - current HR is 103, sinus and has A fib in the 110-115 range on amio infusion at times (not persistent). Is on ceftriaxone and flagyl. Patient was on levo and vaso earlier.   eICU Interventions  I do not think changes are needed at this time If persistent tachycardia occurs with increasing doses of levophed then we can consider switching to neosynephrine  Have asked RN to update vascular team as well      Intervention Category Major Interventions: Arrhythmia - evaluation and management  Jehiel Koepp G Hawa Henly 11/13/2019, 5:04 AM

## 2019-11-13 NOTE — Progress Notes (Signed)
Vascular paged regarding pt conversion from NSR to A Fib with hypotension. RN informed MD that pt was on levo @ 5 and has been on levo most of the night due to hypotension (MAP in 50's). Verbal order taken to give pt 250cc of albumin for the goal of turning the levo off.

## 2019-11-13 NOTE — Progress Notes (Signed)
   VASCULAR SURGERY ASSESSMENT & PLAN:   POD 4 OPEN AAA REPAIR: Overall he is stable.  His postoperative course was complicated by an ischemic colon and an ischemic right leg.  He continues to have a good posterior tibial signal on the right with the Doppler.  He is on levo at 3.   CARDIAC: The patient went into atrial fibrillation.  He is on amiodarone.  His heart rate remains somewhat elevated and his blood pressure somewhat soft.  For this reason I have consulted Dr. Crissie Sickles to assist with management of his rapid A. Fib.  GI NUTRITION: He is getting TNA for nutrition.  He has a colostomy.  RENAL: He is on CRRT.  They are trying to keep him fluid balance.  VAC: He has a fasciotomy site on the medial aspect of the left leg which is still open.  He has a VAC in place with a good seal.  I believe the plan is to try to close that next week.  THROMBOCYTOPENIA: Platelets are 59,000 this morning.  They were 53,000 yesterday.  ID: He is on ceftriaxone and Flagyl.  SUBJECTIVE:   Sedated on vent  PHYSICAL EXAM:   Vitals:   11/13/19 0615 11/13/19 0630 11/13/19 0645 11/13/19 0700  BP: 102/63 (!) 100/58 95/64 (!) 110/56  Pulse: (!) 111 (!) 105 (!) 107 (!) 104  Resp: 20 20 (!) 22 (!) 22  Temp: (!) 97 F (36.1 C) (!) 96.8 F (36 C) (!) 96.8 F (36 C) (!) 96.8 F (36 C)  TempSrc:      SpO2: 99% 99% 99% 99%  Weight:      Height:       LUNGS: Clear ABDOMEN: Hypoactive bowel sounds VASCULAR: He has a posterior tibial signal on the right and a dorsalis pedis and posterior tibial signal on the left with the Doppler. The VAC has a good seal on his medial right leg.  LABS:   Lab Results  Component Value Date   WBC 6.0 11/13/2019   HGB 9.7 (L) 11/13/2019   HCT 30.3 (L) 11/13/2019   MCV 85.8 11/13/2019   PLT 59 (L) 11/13/2019   Lab Results  Component Value Date   CREATININE 2.70 (H) 11/13/2019   Lab Results  Component Value Date   INR 1.7 (H) 11/11/2019   CBG (last 3)    Recent Labs    11/21/2019 2042 11/13/19 0025 11/13/19 0416  GLUCAP 77 70 83    PROBLEM LIST:    Active Problems:   S/P AAA repair   AAA (abdominal aortic aneurysm) (HCC)   CURRENT MEDS:   . chlorhexidine  15 mL Mouth Rinse BID  . Chlorhexidine Gluconate Cloth  6 each Topical Q0600  . insulin aspart  0-15 Units Subcutaneous Q4H  . mouth rinse  15 mL Mouth Rinse Q2H  . pantoprazole (PROTONIX) IV  40 mg Intravenous QHS  . sodium bicarbonate  50 mEq Intravenous Once  . sodium chloride flush  10-40 mL Intracatheter [place] Office: [phone] 11/13/2019

## 2019-11-14 ENCOUNTER — Encounter (HOSPITAL_COMMUNITY): Admission: RE | Disposition: E | Payer: Self-pay | Source: Home / Self Care | Attending: Vascular Surgery

## 2019-11-14 ENCOUNTER — Inpatient Hospital Stay (HOSPITAL_COMMUNITY): Payer: PPO | Admitting: Anesthesiology

## 2019-11-14 DIAGNOSIS — Z9889 Other specified postprocedural states: Secondary | ICD-10-CM

## 2019-11-14 DIAGNOSIS — I4819 Other persistent atrial fibrillation: Secondary | ICD-10-CM

## 2019-11-14 DIAGNOSIS — Z9911 Dependence on respirator [ventilator] status: Secondary | ICD-10-CM

## 2019-11-14 DIAGNOSIS — Z8679 Personal history of other diseases of the circulatory system: Secondary | ICD-10-CM

## 2019-11-14 HISTORY — PX: LAPAROTOMY: SHX154

## 2019-11-14 HISTORY — PX: OMENTECTOMY: SHX5985

## 2019-11-14 HISTORY — PX: CHOLECYSTECTOMY: SHX55

## 2019-11-14 HISTORY — PX: BOWEL RESECTION: SHX1257

## 2019-11-14 LAB — RENAL FUNCTION PANEL
Albumin: 2 g/dL — ABNORMAL LOW (ref 3.5–5.0)
Albumin: 2.3 g/dL — ABNORMAL LOW (ref 3.5–5.0)
Anion gap: 9 (ref 5–15)
Anion gap: 9 (ref 5–15)
BUN: 31 mg/dL — ABNORMAL HIGH (ref 8–23)
BUN: 38 mg/dL — ABNORMAL HIGH (ref 8–23)
CO2: 24 mmol/L (ref 22–32)
CO2: 24 mmol/L (ref 22–32)
Calcium: 8.2 mg/dL — ABNORMAL LOW (ref 8.9–10.3)
Calcium: 7.6 mg/dL — ABNORMAL LOW (ref 8.9–10.3)
Chloride: 104 mmol/L (ref 98–111)
Chloride: 105 mmol/L (ref 98–111)
Creatinine, Ser: 2.56 mg/dL — ABNORMAL HIGH (ref 0.61–1.24)
Creatinine, Ser: 2.64 mg/dL — ABNORMAL HIGH (ref 0.61–1.24)
GFR calc Af Amer: 29 mL/min — ABNORMAL LOW (ref >60)
GFR calc Af Amer: 28 mL/min — ABNORMAL LOW (ref >60)
GFR calc non Af Amer: 25 mL/min — ABNORMAL LOW (ref >60)
GFR calc non Af Amer: 24 mL/min — ABNORMAL LOW (ref >60)
Glucose, Bld: 241 mg/dL — ABNORMAL HIGH (ref 70–99)
Glucose, Bld: 236 mg/dL — ABNORMAL HIGH (ref 70–99)
Phosphorus: 1.5 mg/dL — ABNORMAL LOW (ref 2.5–4.6)
Phosphorus: 2 mg/dL — ABNORMAL LOW (ref 2.5–4.6)
Potassium: 3.6 mmol/L (ref 3.5–5.1)
Potassium: 3.6 mmol/L (ref 3.5–5.1)
Sodium: 137 mmol/L (ref 135–145)
Sodium: 138 mmol/L (ref 135–145)

## 2019-11-14 LAB — CBC
HCT: 29.9 % — ABNORMAL LOW (ref 39.0–52.0)
Hemoglobin: 9.5 g/dL — ABNORMAL LOW (ref 13.0–17.0)
MCH: 27.5 pg (ref 26.0–34.0)
MCHC: 31.8 g/dL (ref 30.0–36.0)
MCV: 86.7 fL (ref 80.0–100.0)
Platelets: 43 10*3/uL — ABNORMAL LOW (ref 150–400)
RBC: 3.45 MIL/uL — ABNORMAL LOW (ref 4.22–5.81)
RDW: 17.8 % — ABNORMAL HIGH (ref 11.5–15.5)
WBC: 7.8 10*3/uL (ref 4.0–10.5)
nRBC: 2.6 % — ABNORMAL HIGH (ref 0.0–0.2)

## 2019-11-14 LAB — POCT I-STAT 7, (LYTES, BLD GAS, ICA,H+H)
Acid-Base Excess: 0 mmol/L (ref 0.0–2.0)
Bicarbonate: 25.9 mmol/L (ref 20.0–28.0)
Calcium, Ion: 1.02 mmol/L — ABNORMAL LOW (ref 1.15–1.40)
HCT: 24 % — ABNORMAL LOW (ref 39.0–52.0)
Hemoglobin: 8.2 g/dL — ABNORMAL LOW (ref 13.0–17.0)
O2 Saturation: 99 %
Patient temperature: 35.9
Potassium: 3.1 mmol/L — ABNORMAL LOW (ref 3.5–5.1)
Sodium: 138 mmol/L (ref 135–145)
TCO2: 27 mmol/L (ref 22–32)
pCO2 arterial: 46.2 mmHg (ref 32.0–48.0)
pH, Arterial: 7.351 (ref 7.350–7.450)
pO2, Arterial: 120 mmHg — ABNORMAL HIGH (ref 83.0–108.0)

## 2019-11-14 LAB — PROTIME-INR
INR: 1.2 (ref 0.8–1.2)
Prothrombin Time: 15.1 seconds (ref 11.4–15.2)

## 2019-11-14 LAB — GLUCOSE, CAPILLARY
Glucose-Capillary: 161 mg/dL — ABNORMAL HIGH (ref 70–99)
Glucose-Capillary: 186 mg/dL — ABNORMAL HIGH (ref 70–99)
Glucose-Capillary: 212 mg/dL — ABNORMAL HIGH (ref 70–99)
Glucose-Capillary: 227 mg/dL — ABNORMAL HIGH (ref 70–99)
Glucose-Capillary: 233 mg/dL — ABNORMAL HIGH (ref 70–99)
Glucose-Capillary: 294 mg/dL — ABNORMAL HIGH (ref 70–99)

## 2019-11-14 LAB — APTT: aPTT: 42 seconds — ABNORMAL HIGH (ref 24–36)

## 2019-11-14 LAB — MAGNESIUM: Magnesium: 2.5 mg/dL — ABNORMAL HIGH (ref 1.7–2.4)

## 2019-11-14 SURGERY — LAPAROTOMY, EXPLORATORY
Anesthesia: General | Site: Abdomen

## 2019-11-14 MED ORDER — SODIUM CHLORIDE 0.9 % IV SOLN
INTRAVENOUS | Status: DC | PRN
Start: 2019-11-14 — End: 2019-11-14

## 2019-11-14 MED ORDER — SODIUM PHOSPHATES 45 MMOLE/15ML IV SOLN
10.0000 mmol | Freq: Once | INTRAVENOUS | Status: AC
  Administered 2019-11-14: 10 mmol via INTRAVENOUS
  Filled 2019-11-14: qty 3.33

## 2019-11-14 MED ORDER — CISATRACURIUM BESYLATE (PF) 10 MG/5ML IV SOLN
INTRAVENOUS | Status: DC | PRN
  Administered 2019-11-14: 4 mg via INTRAVENOUS
  Administered 2019-11-14: 8 mg via INTRAVENOUS
  Administered 2019-11-14 (×2): 4 mg via INTRAVENOUS

## 2019-11-14 MED ORDER — ETOMIDATE 2 MG/ML IV SOLN
INTRAVENOUS | Status: AC
  Filled 2019-11-14: qty 10

## 2019-11-14 MED ORDER — PHENYLEPHRINE 40 MCG/ML (10ML) SYRINGE FOR IV PUSH (FOR BLOOD PRESSURE SUPPORT)
PREFILLED_SYRINGE | INTRAVENOUS | Status: DC | PRN
  Administered 2019-11-14 (×2): 80 ug via INTRAVENOUS

## 2019-11-14 MED ORDER — MIDAZOLAM HCL 2 MG/2ML IJ SOLN
INTRAMUSCULAR | Status: AC
  Filled 2019-11-14: qty 2

## 2019-11-14 MED ORDER — CISATRACURIUM BESYLATE 20 MG/10ML IV SOLN
INTRAVENOUS | Status: AC
  Filled 2019-11-14: qty 10

## 2019-11-14 MED ORDER — HEMOSTATIC AGENTS (NO CHARGE) OPTIME
TOPICAL | Status: DC | PRN
Start: 2019-11-14 — End: 2019-11-14
  Administered 2019-11-14: 1 via TOPICAL

## 2019-11-14 MED ORDER — TRACE MINERALS CU-MN-SE-ZN 300-55-60-3000 MCG/ML IV SOLN
INTRAVENOUS | Status: AC
  Filled 2019-11-14: qty 996.8

## 2019-11-14 MED ORDER — PROPOFOL 10 MG/ML IV BOLUS
INTRAVENOUS | Status: AC
  Filled 2019-11-14: qty 20

## 2019-11-14 MED ORDER — ALBUMIN HUMAN 5 % IV SOLN: INTRAVENOUS | Status: DC | PRN

## 2019-11-14 MED ORDER — SODIUM CHLORIDE 0.9 % IV SOLN: INTRAVENOUS | Status: DC | PRN

## 2019-11-14 MED ORDER — INSULIN ASPART 100 UNIT/ML ~~LOC~~ SOLN
0.0000 [IU] | SUBCUTANEOUS | Status: DC
  Administered 2019-11-14: 4 [IU] via SUBCUTANEOUS
  Administered 2019-11-14: 7 [IU] via SUBCUTANEOUS
  Administered 2019-11-14: 4 [IU] via SUBCUTANEOUS
  Administered 2019-11-14: 11 [IU] via SUBCUTANEOUS
  Administered 2019-11-15 (×3): 7 [IU] via SUBCUTANEOUS
  Administered 2019-11-15 – 2019-11-16 (×5): 4 [IU] via SUBCUTANEOUS
  Administered 2019-11-16: 7 [IU] via SUBCUTANEOUS

## 2019-11-14 MED ORDER — SODIUM CHLORIDE 0.9% IV SOLUTION: Freq: Once | INTRAVENOUS | Status: DC

## 2019-11-14 MED ORDER — FENTANYL CITRATE (PF) 250 MCG/5ML IJ SOLN
INTRAMUSCULAR | Status: DC | PRN
  Administered 2019-11-14: 25 ug via INTRAVENOUS
  Administered 2019-11-14 (×2): 50 ug via INTRAVENOUS

## 2019-11-14 MED ORDER — ROCURONIUM BROMIDE 10 MG/ML (PF) SYRINGE
PREFILLED_SYRINGE | INTRAVENOUS | Status: AC
  Filled 2019-11-14: qty 10

## 2019-11-14 MED ORDER — FENTANYL CITRATE (PF) 250 MCG/5ML IJ SOLN
INTRAMUSCULAR | Status: AC
  Filled 2019-11-14: qty 5

## 2019-11-14 MED ORDER — 0.9 % SODIUM CHLORIDE (POUR BTL) OPTIME
TOPICAL | Status: DC | PRN
  Administered 2019-11-14 (×3): 1000 mL

## 2019-11-14 MED ORDER — MIDAZOLAM HCL 5 MG/5ML IJ SOLN
INTRAMUSCULAR | Status: DC | PRN
  Administered 2019-11-14 (×2): 2 mg via INTRAVENOUS

## 2019-11-14 SURGICAL SUPPLY — 51 items
BIOPATCH RED 1 DISK 7.0 (GAUZE/BANDAGES/DRESSINGS) ×2 IMPLANT
BIOPATCH RED 1IN DISK 7.0MM (GAUZE/BANDAGES/DRESSINGS) ×2
BNDG GAUZE ELAST 4 BULKY (GAUZE/BANDAGES/DRESSINGS) ×4 IMPLANT
CANISTER SUCT 3000ML PPV (MISCELLANEOUS) ×3 IMPLANT
COVER SURGICAL LIGHT HANDLE (MISCELLANEOUS) ×3 IMPLANT
COVER WAND RF STERILE (DRAPES) ×3 IMPLANT
DRAIN CHANNEL 19F RND (DRAIN) ×2 IMPLANT
DRAPE LAPAROSCOPIC ABDOMINAL (DRAPES) ×3 IMPLANT
DRAPE WARM FLUID 44X44 (DRAPES) ×3 IMPLANT
DRSG PAD ABDOMINAL 8X10 ST (GAUZE/BANDAGES/DRESSINGS) ×4 IMPLANT
DRSG TEGADERM 4X4.75 (GAUZE/BANDAGES/DRESSINGS) ×4 IMPLANT
ELECT BLADE 6.5 EXT (BLADE) ×2 IMPLANT
ELECT REM PT RETURN 9FT ADLT (ELECTROSURGICAL) ×3
ELECTRODE REM PT RTRN 9FT ADLT (ELECTROSURGICAL) ×1 IMPLANT
EVACUATOR SILICONE 100CC (DRAIN) ×2 IMPLANT
GLOVE BIO SURGEON STRL SZ7.5 (GLOVE) ×3 IMPLANT
GLOVE BIOGEL PI IND STRL 6.5 (GLOVE) IMPLANT
GLOVE BIOGEL PI INDICATOR 6.5 (GLOVE) ×2
GLOVE ECLIPSE 7.5 STRL STRAW (GLOVE) ×2 IMPLANT
GLOVE INDICATOR 8.0 STRL GRN (GLOVE) ×3 IMPLANT
GLOVE SURG SS PI 6.5 STRL IVOR (GLOVE) ×2 IMPLANT
GOWN STRL REUS W/ TWL LRG LVL3 (GOWN DISPOSABLE) ×1 IMPLANT
GOWN STRL REUS W/ TWL XL LVL3 (GOWN DISPOSABLE) ×1 IMPLANT
GOWN STRL REUS W/TWL LRG LVL3 (GOWN DISPOSABLE) ×6
GOWN STRL REUS W/TWL XL LVL3 (GOWN DISPOSABLE)
HANDLE SUCTION POOLE (INSTRUMENTS) ×1 IMPLANT
HEMOSTAT SURGICEL 4X8 (HEMOSTASIS) ×2 IMPLANT
KIT BASIN OR (CUSTOM PROCEDURE TRAY) ×3 IMPLANT
KIT OSTOMY DRAINABLE 2.75 STR (WOUND CARE) ×2 IMPLANT
KIT TURNOVER KIT B (KITS) ×3 IMPLANT
LIGASURE IMPACT 36 18CM CVD LR (INSTRUMENTS) ×2 IMPLANT
NS IRRIG 1000ML POUR BTL (IV SOLUTION) ×6 IMPLANT
PACK GENERAL/GYN (CUSTOM PROCEDURE TRAY) ×3 IMPLANT
PAD ARMBOARD 7.5X6 YLW CONV (MISCELLANEOUS) ×3 IMPLANT
PENCIL SMOKE EVACUATOR (MISCELLANEOUS) ×3 IMPLANT
RELOAD PROXIMATE 75MM BLUE (ENDOMECHANICALS) ×6 IMPLANT
RELOAD STAPLE 75 3.8 BLU REG (ENDOMECHANICALS) IMPLANT
SPECIMEN JAR LARGE (MISCELLANEOUS) ×4 IMPLANT
SPONGE LAP 18X18 RF (DISPOSABLE) ×2 IMPLANT
STAPLER GUN LINEAR PROX 60 (STAPLE) ×2 IMPLANT
STAPLER VISISTAT 35W (STAPLE) ×1 IMPLANT
SUCTION POOLE HANDLE (INSTRUMENTS) ×3
SUT ETHILON 2 0 FS 18 (SUTURE) ×2 IMPLANT
SUT PDS AB 1 TP1 96 (SUTURE) ×6 IMPLANT
SUT SILK 2 0 SH CR/8 (SUTURE) ×3 IMPLANT
SUT SILK 2 0 TIES 10X30 (SUTURE) ×3 IMPLANT
SUT SILK 3 0 SH CR/8 (SUTURE) ×3 IMPLANT
SUT SILK 3 0 TIES 10X30 (SUTURE) ×3 IMPLANT
TAPE CLOTH SURG 6X10 WHT LF (GAUZE/BANDAGES/DRESSINGS) ×2 IMPLANT
TOWEL GREEN STERILE (TOWEL DISPOSABLE) ×3 IMPLANT
YANKAUER SUCT BULB TIP NO VENT (SUCTIONS) ×2 IMPLANT

## 2019-11-14 NOTE — Progress Notes (Signed)
PHARMACY - TOTAL PARENTERAL NUTRITION CONSULT NOTE  Indication:  Bowel ischemia  Patient Measurements: Height: 5\' 9"  (175.3 cm) Weight: 79.4 kg (175 lb 0.7 oz) IBW/kg (Calculated) : 70.7 TPN AdjBW (KG): 75 Body mass index is 25.85 kg/m.  Assessment:   41 YOM presented on 11/09/2019 for AAA repair with aortobifemoral bypass.  Post-op course complicated by increased WOB, hypotension and cardiac arrest.  He was taken back to the OR on 11/13/2019 for left colectomy (left in discontinuity) and VAC placement for ischemic left colon, and reopening of recent laparotomy, RLE thrombectomy and 4 compartment fasciotomies.  Pharmacy consulted to manage TPN as no oral/enteral nutrition anticipated within the next several days.  Patient is intubated and sedated - unsure of nutritional status PTA.  Glucose / Insulin: hx DM on glipizide PTA - CBGs 184-241, on mSSI with 24 units used yesterday, will change to resistant SSI and begin to add lipid to TPN which will decrease CHO load Electrolytes: K down to 3.6 (4K bath through CRRT), Ca 8.2, Phos down to 1.5 (will replace and add back a small amount to the TPN while on CRRT), Mag at 2.5 Renal: ESRD, last HD session 5/3.  CRRT started 5/4.  SCr down 2.56, BUN 31 LFTs / TGs: AST/ALT 2371/816, tbili down to 1.7, TG WNL Prealbumin / albumin: albumin 2.6, amylase 258, prealbumin slightly low at 15.5 Intake / Output; MIVF: no UOP, NG O/P 627mL, drain 890mL  GI Imaging: none since TPN consult Surgeries / Procedures:  5/7 - closure RLE lateral fasciotomy and partial closure with VAC placement on medial RLE fasciotomy 5/7 -  Small bowel resection, additional rectum resection, end transverse colostomy 5/9 - plan for re-ex lap and possible abdominal closure  Central access: CVC double lumen placed 11/09/2019 TPN start date: 11/11/19  Nutritional Goals (RD rec on 5/5): 1875-2250 kCal, 130-150gm protein per day  Current Nutrition:  TPN  Plan:  Increase concentrated TPN rate  to goal of 70 ml/hr (goal rate of 70 ml/hr).  TPN provides 150g AA, 229g CHO and 58 gm lipid for a total of 1900 kCal, meeting ~100% of patient needs Electrolytes in TPN: standard Na, increase Ca, Phos 5, Cl:Ac 1:2 - may need to add other lytes as CRRT continues to clear them Daily multivitamin and trace elements in TPN.  No chromium with ESRD. Increase to resistant SSI Q4H - assess response to SSI and hold off on adding insulin to TPN F/U AM labs, CBGs  NaPhos 10 mmol x 1  Alanda Slim, PharmD, [place] Clinical Pharmacist Please see AMION for all Pharmacists' Contact Phone Numbers 11/07/2019, 7:04 AM

## 2019-11-14 NOTE — Progress Notes (Signed)
Progress Note  Patient Name: Juan Bass Date of Encounter: 12/03/2019  Primary Cardiologist: Carlyle Dolly, MD   Subjective   Remains intubated, s/p abdominal re-exploration for necrotic bowel after AAA (open) repair.  Inpatient Medications    Scheduled Meds: . sodium chloride   Intravenous Once  . chlorhexidine  15 mL Mouth Rinse BID  . Chlorhexidine Gluconate Cloth  6 each Topical Q0600  . insulin aspart  0-20 Units Subcutaneous Q4H  . levothyroxine  25 mcg Intravenous Daily  . mouth rinse  15 mL Mouth Rinse Q2H  . pantoprazole (PROTONIX) IV  40 mg Intravenous QHS  . sodium bicarbonate  50 mEq Intravenous Once  . sodium chloride flush  10-40 mL Intracatheter Q12H   Continuous Infusions: .  prismasol BGK 4/2.5 500 mL/hr at 12/06/2019 0230  . sodium chloride 500 mL/hr at 11/11/19 1250  . sodium chloride    . sodium chloride    . sodium chloride 20 mL/hr at 11/15/2019 0700  . amiodarone 30 mg/hr (11/09/2019 0700)  . cefTRIAXone (ROCEPHIN)  IV 2 g (11/13/19 2104)  . dexmedetomidine    . fentaNYL infusion INTRAVENOUS 200 mcg/hr (11/11/2019 0750)  . metronidazole 500 mg (12/03/2019 1109)  . norepinephrine (LEVOPHED) Adult infusion Stopped (11/13/19 1836)  . prismasol BGK 0/2.5 300 mL/hr at 11/09/2019 1900  . prismasol BGK 4/2.5 1,500 mL/hr at 11/28/2019 0229  . sodium phosphate  Dextrose 5% IVPB 10 mmol (11/07/2019 0741)  . TPN ADULT (ION) 70 mL/hr at 11/13/2019 0750  . TPN ADULT (ION)    . vasopressin (PITRESSIN) infusion - *FOR SHOCK* Stopped (11/15/2019 8756)   PRN Meds: sodium chloride, sodium chloride, sodium chloride, Place/Maintain arterial line **AND** sodium chloride, acetaminophen **OR** [DISCONTINUED] acetaminophen, alteplase, fentaNYL, fentaNYL (SUBLIMAZE) injection, fentaNYL (SUBLIMAZE) injection, heparin, heparin, lidocaine (PF), lidocaine-prilocaine, midazolam, midazolam, ondansetron, pentafluoroprop-tetrafluoroeth, phenol, sodium chloride flush   Vital Signs      Vitals:   11/20/2019 0730 11/25/2019 0745 11/09/2019 1038 11/13/2019 1102  BP:      Pulse: 83     Resp: (!) 22 (!) 22 (!) 22   Temp: 98.2 F (36.8 C) 98.2 F (36.8 C)    TempSrc:      SpO2: 98% 98%  99%  Weight:      Height:        Intake/Output Summary (Last 24 hours) at 11/28/2019 1213 Last data filed at 11/17/2019 1029 Gross per 24 hour  Intake 4678.91 ml  Output 2945 ml  Net 1733.91 ml   Filed Weights   12/02/2019 0500 11/13/19 0500 11/20/2019 0500  Weight: 79 kg 81.9 kg 79.4 kg    Telemetry    Atrial fib with a CVR/RVR - Personally Reviewed  ECG    none - Personally Reviewed  Physical Exam   GEN: intubated and sedated on fentanyl Neck: unable to assess JVD Cardiac: IRIRR, no murmurs, rubs, or gallops.  Respiratory: Clear except occaisional scattered rales GI: Soft, nontender, non-distended  MS: No edema; No deformity. Neuro:  Nonfocal  Psych: Normal affect   Labs    Chemistry Recent Labs  Lab 11/07/2019 1240 12/04/2019 1248 11/11/19 0221 11/11/19 0237 11/13/19 0307 11/13/19 0307 11/13/19 0932 11/13/19 1540 11/09/2019 0350 11/30/2019 0947  NA 139   < > 137   < > 138   < >  --  135 137 138  K 6.4*   < > 5.1   < > 4.1   < >  --  3.9 3.6 3.1*  CL 103   < >  100   < > 103  --   --  102 104  --   CO2 19*   < > 23   < > 24  --   --  23 24  --   GLUCOSE 88   < > 114*   < > 182*  --   --  185* 241*  --   BUN 39*   < > 31*   < > 26*  --   --  27* 31*  --   CREATININE 5.17*   < > 4.08*   < > 2.70*  --   --  2.53* 2.56*  --   CALCIUM 7.1*   < > 6.8*   < > 8.2*  --   --  8.0* 8.2*  --   PROT 4.5*  --  5.2*  --   --   --  5.0*  --   --   --   ALBUMIN 2.6*   < > 2.7*   < > 2.5*   < > 2.6* 2.4* 2.0*  --   AST 2,133*  --  2,371*  --   --   --  1,659*  --   --   --   ALT 848*  --  816*  --   --   --  523*  --   --   --   ALKPHOS 52  --  75  --   --   --  75  --   --   --   BILITOT 2.0*  --  1.7*  --   --   --  1.8*  --   --   --   GFRNONAA 11*   < > 14*   < > 23*  --   --  25*  25*  --   GFRAA 12*   < > 16*   < > 27*  --   --  29* 29*  --   ANIONGAP 17*   < > 14   < > 11  --   --  10 9  --    < > = values in this interval not displayed.     Hematology Recent Labs  Lab 11/15/2019 0340 11/07/2019 0812 11/13/19 0307 11/25/2019 0350 11/23/2019 0947  WBC 5.6  --  6.0 7.8  --   RBC 3.21*  --  3.53* 3.45*  --   HGB 8.9*   < > 9.7* 9.5* 8.2*  HCT 27.2*   < > 30.3* 29.9* 24.0*  MCV 84.7  --  85.8 86.7  --   MCH 27.7  --  27.5 27.5  --   MCHC 32.7  --  32.0 31.8  --   RDW 17.4*  --  17.4* 17.8*  --   PLT 53*  --  59* 43*  --    < > = values in this interval not displayed.    Cardiac EnzymesNo results for input(s): TROPONINI in the last 168 hours. No results for input(s): TROPIPOC in the last 168 hours.   BNP Recent Labs  Lab 11/09/19 1025  BNP 271.6*     DDimer No results for input(s): DDIMER in the last 168 hours.   Radiology    No results found.  Cardiac Studies   none  Patient Profile     69 y.o. male admitted for AAA repair complicated by necrotic bowel/leg, likely embolic now with atrial fib  Assessment & Plan    1. Atrial  fib with a RVR/CVR - he is actually fairly well rate controlled with atrial fib at a 100/min. Continue amiodarone for rate control. If his HR goes over 130, would give one dose of IV digoxin 0.25 mg IV.  2. AAA repair - he has just returned from surgical exploration. His hemodynamics are acceptable.      For questions or updates, please contact [place] Please consult [email or web address] for contact info under Cardiology/STEMI.      Signed, Cristopher Peru, MD  11/15/2019, 12:13 PM  [name], male   DOB: 09-19-1950, 69 y.o.   MRN: [id number]

## 2019-11-14 NOTE — Anesthesia Preprocedure Evaluation (Signed)
Anesthesia Evaluation  Patient identified by MRN, date of birth, ID bandGeneral Assessment Comment:Patient sedated on vent.  Reviewed: Patient's Chart, lab work & pertinent test results, Unable to perform ROS - Chart review only  Airway Mallampati: Intubated       Dental   Pulmonary Current Smoker and Patient abstained from smoking.,     + decreased breath sounds      Cardiovascular hypertension,  Rhythm:Irregular Rate:Normal     Neuro/Psych    GI/Hepatic   Endo/Other  diabetes  Renal/GU      Musculoskeletal   Abdominal   Peds  Hematology   Anesthesia Other Findings   Reproductive/Obstetrics                             Anesthesia Physical Anesthesia Plan  ASA: III  Anesthesia Plan: General   Post-op Pain Management:    Induction: Intravenous  PONV Risk Score and Plan: Ondansetron  Airway Management Planned: Oral ETT  Additional Equipment:   Intra-op Plan:   Post-operative Plan: Post-operative intubation/ventilation  Informed Consent: I have reviewed the patients History and Physical, chart, labs and discussed the procedure including the risks, benefits and alternatives for the proposed anesthesia with the patient or authorized representative who has indicated his/her understanding and acceptance.       Plan Discussed with: CRNA and Anesthesiologist  Anesthesia Plan Comments:         Anesthesia Quick Evaluation

## 2019-11-14 NOTE — Anesthesia Postprocedure Evaluation (Signed)
Anesthesia Post Note  Patient: Juan Bass  Procedure(s) Performed: EXPLORATORY LAPAROTOMY, REMOVAL OF GALLBLADDER WALL (N/A Abdomen) SMALL BOWEL RESECTION (N/A Abdomen) Partial Omentectomy (N/A Abdomen) Cholecystectomy (N/A Abdomen)     Patient location during evaluation: SICU Anesthesia Type: General Level of consciousness: sedated and patient remains intubated per anesthesia plan Pain management: pain level controlled Vital Signs Assessment: post-procedure vital signs reviewed and stable Respiratory status: patient remains intubated per anesthesia plan and patient on ventilator - see flowsheet for VS Cardiovascular status: stable Postop Assessment: no apparent nausea or vomiting Anesthetic complications: no    Last Vitals:  Vitals:   11/29/2019 1930 11/09/2019 2000  BP: (!) 155/36 (!) 122/58  Pulse: 93 93  Resp: (!) 23 (!) 21  Temp:  37.4 C  SpO2:  100%    Last Pain:  Vitals:   11/24/2019 1800  TempSrc: Esophageal  PainSc:                  Juan Bass

## 2019-11-14 NOTE — Progress Notes (Signed)
Progress Note: General Surgery Service   Chief Complaint/Subjective: OR Friday for colostomy creation, small bowel resection, left with open abdomen with abthera vac. Able to wean levophed overnight, A fib with rvr overnight  Objective: Vital signs in last 24 hours: Temp:  [96.4 F (35.8 C)-98.2 F (36.8 C)] 97.2 F (36.2 C) (05/09 0700) Pulse Rate:  [75-109] 102 (05/09 0700) Resp:  [13-25] 13 (05/09 0700) BP: (86-177)/(36-78) 99/56 (05/09 0600) SpO2:  [96 %-100 %] 100 % (05/09 0700) Arterial Line BP: (102-202)/(33-76) 149/47 (05/09 0700) FiO2 (%):  [40 %-50 %] 40 % (05/09 0400) Weight:  [79.4 kg] 79.4 kg (05/09 0500) Last BM Date: 11/21/2019  Intake/Output from previous day: 05/08 0701 - 05/09 0700 In: 2904.6 [I.V.:2604.6; IV Piggyback:300] Out: 3268 [Emesis/NG output:650; Drains:550] Intake/Output this shift: No intake/output data recorded.  Gen: intubated, opens eyes  Resp: assisted  Card: irreg irreg  Abd: blue sponge vac in place and functional, left sided ostomy with purple/dusky mucosa  Lab Results: CBC  Recent Labs    11/13/19 0307 11/09/2019 0350  WBC 6.0 7.8  HGB 9.7* 9.5*  HCT 30.3* 29.9*  PLT 59* 43*   BMET Recent Labs    11/13/19 1540 11/15/2019 0350  NA 135 137  K 3.9 3.6  CL 102 104  CO2 23 24  GLUCOSE 185* 241*  BUN 27* 31*  CREATININE 2.53* 2.56*  CALCIUM 8.0* 8.2*   PT/INR No results for input(s): LABPROT, INR in the last 72 hours. ABG Recent Labs    11/15/2019 0950 11/09/2019 1304  PHART 7.350 7.383  HCO3 25.7 24.9    Anti-infectives: Anti-infectives (From admission, onward)   Start     Dose/Rate Route Frequency Ordered Stop   11/09/19 1900  cefTRIAXone (ROCEPHIN) 2 g in sodium chloride 0.9 % 100 mL IVPB     2 g 200 mL/hr over 30 Minutes Intravenous Every 24 hours 11/09/19 1725     11/09/19 1900  metroNIDAZOLE (FLAGYL) IVPB 500 mg     500 mg 100 mL/hr over 60 Minutes Intravenous Every 8 hours 11/09/19 1725     11/09/19 0800   ceFAZolin (ANCEF) IVPB 2g/100 mL premix     2 g 200 mL/hr over 30 Minutes Intravenous Every 8 hours 12/03/2019 1545 11/09/19 1559   11/20/2019 0625  ceFAZolin (ANCEF) IVPB 2g/100 mL premix     2 g 200 mL/hr over 30 Minutes Intravenous 30 min pre-op 12/05/2019 0625 11/25/2019 0840      Medications: Scheduled Meds: . sodium chloride   Intravenous Once  . chlorhexidine  15 mL Mouth Rinse BID  . Chlorhexidine Gluconate Cloth  6 each Topical Q0600  . insulin aspart  0-20 Units Subcutaneous Q4H  . levothyroxine  25 mcg Intravenous Daily  . mouth rinse  15 mL Mouth Rinse Q2H  . pantoprazole (PROTONIX) IV  40 mg Intravenous QHS  . sodium bicarbonate  50 mEq Intravenous Once  . sodium chloride flush  10-40 mL Intracatheter Q12H   Continuous Infusions: .  prismasol BGK 4/2.5 500 mL/hr at 11/15/2019 0230  . sodium chloride 500 mL/hr at 11/11/19 1250  . sodium chloride    . sodium chloride    . sodium chloride 20 mL/hr at 11/28/2019 0700  . amiodarone 30 mg/hr (12/03/2019 0700)  . cefTRIAXone (ROCEPHIN)  IV 2 g (11/13/19 2104)  . dexmedetomidine    . fentaNYL infusion INTRAVENOUS 200 mcg/hr (12/01/2019 0700)  . metronidazole Stopped (11/07/2019 0412)  . norepinephrine (LEVOPHED) Adult infusion Stopped (11/13/19 1836)  .  prismasol BGK 0/2.5 300 mL/hr at 11/15/2019 1900  . prismasol BGK 4/2.5 1,500 mL/hr at 11/20/2019 0229  . sodium phosphate  Dextrose 5% IVPB    . TPN ADULT (ION) 70 mL/hr at 12/04/2019 0700  . TPN ADULT (ION)    . vasopressin (PITRESSIN) infusion - *FOR SHOCK* Stopped (11/25/2019 0076)   PRN Meds:.sodium chloride, sodium chloride, sodium chloride, Place/Maintain arterial line **AND** sodium chloride, acetaminophen **OR** [DISCONTINUED] acetaminophen, alteplase, fentaNYL, fentaNYL (SUBLIMAZE) injection, fentaNYL (SUBLIMAZE) injection, heparin, heparin, lidocaine (PF), lidocaine-prilocaine, midazolam, midazolam, ondansetron, pentafluoroprop-tetrafluoroeth, phenol, sodium chloride  flush  Assessment/Plan: s/p Procedure(s): REOPENING LAPAROTOMY COLOSTOMY WITH ABDOMINAL CLOSURE IRRIGATION AND DEBRIDEMENT OF RIGHT LEG FASCIOTOMY CLOSURE RIGHT LEG Application Of Wound Vac RIGHT LEG Small Bowel Resection with Rectal Resection Application Of Wound Vac 12/05/2019  69 yo male s/p AAA repair complicated with ischemic left colon s/p left colectomy, small bowel resection, left ostomy creation -continue TPN, continue abx, NG tube, bowel rest -plan for return to OR today for reexploration and possible closure -Platelets ordered to be given in OR -repeat LFTs due to AST > 1000 on 5/6, if shock liver is persistent clinical outcome expectation would worsen. -We reviewed with Keit on the phone exploratory laparotomy, washout, possible additional bowel resections, possible abdominal closure if favorable. -The planned procedures, material risks (including, but not limited to, pain, bleeding, infection, scarring, need for blood transfusion, damage to surrounding structures- blood vessels/nerves/viscus/organs, leak from anastomosis, need for additional procedures, medication reactions, worsening of pre-existing medical conditions, hernia, recurrence, pneumonia, heart attack, stroke, death) benefits and alternatives to surgery were discussed at length. Keith's questions were answered to his satisfaction, he voiced understanding and elected to proceed with surgery. Additionally, we discussed typical postoperative expectations and the recovery process.   LOS: 6 days   Ileana Roup, MD [facility]

## 2019-11-14 NOTE — Progress Notes (Signed)
   VASCULAR SURGERY ASSESSMENT & PLAN:   POD 5 OPEN AAA REPAIR:  General surgery plans return to the operating room today for possible closure of the abdomen.  He is off pressors now.  He remains on the vent.  CARDIAC: Appreciate Dr. Tanna Furry assistance with management of his atrial fibrillation.  He is on amiodarone.  GI NUTRITION: He is getting TNA for nutrition.  He has a colostomy.  RENAL: He is on CRRT.  They are trying to keep him fluid balance.  VAC: He has a fasciotomy site on the medial aspect of the left leg which is still open. He has a VAC in place with a good seal.   THROMBOCYTOPENIA: His platelets are down to 43,000 this morning.  He is not receiving any heparin.  ID: He is on ceftriaxone and Flagyl.   SUBJECTIVE:   Sedated on vent.  PHYSICAL EXAM:   Vitals:   11/18/2019 0300 11/28/2019 0400 11/28/2019 0500 11/13/2019 0600  BP: 115/63 (!) 147/36 (!) 108/52 (!) 99/56  Pulse: 82 80 75 88  Resp: 20 (!) 23 20 20   Temp: 98.1 F (36.7 C) 97.7 F (36.5 C) (!) 97.5 F (36.4 C) (!) 97.5 F (36.4 C)  TempSrc:      SpO2: 100% 100% 100% 96%  Weight:   79.4 kg   Height:       LUNGS: Good air exchange bilaterally. He has a peroneal, anterior tibial, and posterior tibial signal with a Doppler on the right.  Also good Doppler signals in the left foot. The VAC on the medial aspect of his leg has a good seal. The anterior leg has potentially some full-thickness skin injury.     LABS:   Lab Results  Component Value Date   WBC 7.8 11/09/2019   HGB 9.5 (L) 11/09/2019   HCT 29.9 (L) 11/24/2019   MCV 86.7 11/25/2019   PLT 43 (L) 11/26/2019   Lab Results  Component Value Date   CREATININE 2.56 (H) 11/25/2019   Lab Results  Component Value Date   INR 1.7 (H) 11/11/2019   CBG (last 3)  Recent Labs    11/13/19 2001 11/20/2019 0010 11/24/2019 0350  GLUCAP 214* 227* 233*    PROBLEM LIST:    Active Problems:   S/P AAA repair   AAA (abdominal aortic aneurysm)  (HCC)   CURRENT MEDS:   . chlorhexidine  15 mL Mouth Rinse BID  . Chlorhexidine Gluconate Cloth  6 each Topical Q0600  . insulin aspart  0-15 Units Subcutaneous Q4H  . levothyroxine  25 mcg Intravenous Daily  . mouth rinse  15 mL Mouth Rinse Q2H  . pantoprazole (PROTONIX) IV  40 mg Intravenous QHS  . sodium bicarbonate  50 mEq Intravenous Once  . sodium chloride flush  10-40 mL Intracatheter [place] Office: [phone] 11/18/2019

## 2019-11-14 NOTE — Progress Notes (Signed)
NAME:  Juan Bass, MRN:  [id number], DOB:  25-Nov-1950, LOS: 6 ADMISSION DATE:  11/24/2019, CONSULTATION DATE: 11/09/2019 REFERRING MD: Vascular surgery, CHIEF COMPLAINT: Hypoxia hypotension  Brief History   69 year old end-stage renal disease status post AAA repair 11/15/2019 with increasing work of breathing and hypotension following surgery.  History of present illness   69 year old male with known history of end-stage renal disease with a right bicep graft along with hypertension hypothyroidism COPD with chronic long-term tobacco abuse.  Known 6 cm aortic abdominal aneurysm for which he underwent a AAA repair 11/26/2019 along with aortobifem for his vascular disease.  He was extubating in the PACU on 12/06/2019 was not initially hypertensive and required a labetalol drip underwent hemodialysis became hypotensive.  Initially placed on Neo-Synephrine drip did not elevated to a Levophed drip.  Also noted to have some hypoxia with increasing oxygen needs chest x-ray was remarkable for hypoventilation.  Pulmonary critical care was called to bedside for possible interventions.  He is able to vocalize he is not using accessory muscles at the time of examination.  But ends this weakened state and inability to take deep breaths and underlying COPD he may require intubation in the near future.  Past Medical History  End-stage renal disease COPD Diabetes Hypertension Hypothyroidism  Significant Hospital Events   11/09/2019 found to be hypoxic  Consults:  Nephro Surgery Cardiology Vascular PCCM  Procedures:  11/07/2019 status post aortobifem and AAA repair  5/7 REOPENING LAPAROTOMY COLOSTOMY WITH ABDOMINAL CLOSURE IRRIGATION AND DEBRIDEMENT OF RIGHT LEG FASCIOTOMY CLOSURE RIGHT LEG Application Of Wound Vac RIGHT LEG Small Bowel Resection with Rectal Resection  5/9 1. Reopening of recent laparotomy 2. Small bowel resection with anastomosis 3. Cholecystectomy 4. Partial omentectomy 5.  Abdominal washout and closure  Significant Diagnostic Tests:  11/09/2019 chest x-ray with decreased excursion with bilateral atelectasis  CTA C/A/P 5/4 IMPRESSION: 1. Negative for acute PE or thoracic aortic dissection. 2. Interval infrarenal aortobifem graft repair, patent. 3. 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 4. Small amount of pelvic and abdominal ascites. 5. Small left and trace right pleural effusions. 6. Right middle and lower lobe consolidation/atelectasis with probable occlusive central bronchial secretions. 7. Coronary artery disease.  Micro Data:  None  Antimicrobials:  5/4>> Ceftriaxone 5/4>> Metronidazole  Interim history/subjective:  Back from OR. Not on pressors. HR looks pretty good. Bps jump up with any fentanyl wean.  Objective   Blood pressure (!) 96/47, pulse 83, temperature 98.2 F (36.8 C), resp. rate (!) 22, height 5\' 9"  (1.753 m), weight 79.4 kg, SpO2 99 %. CVP:  [4 mmHg-10 mmHg] 4 mmHg  Vent Mode: PRVC FiO2 (%):  [40 %-60 %] 40 % Set Rate:  [22 bmp] 22 bmp Vt Set:  [560 mL] 560 mL PEEP:  [5 cmH20-8 cmH20] 5 cmH20 Plateau Pressure:  [19 cmH20-23 cmH20] 19 cmH20   Intake/Output Summary (Last 24 hours) at 11/11/2019 1213 Last data filed at 12/03/2019 1029 Gross per 24 hour  Intake 4678.91 ml  Output 2945 ml  Net 1733.91 ml   Filed Weights   11/07/2019 0500 11/13/19 0500 11/15/2019 0500  Weight: 79 kg 81.9 kg 79.4 kg    Examination: GEN: ill appearing man on vent HEENT: ETT in place, mild secretions CV: Tachycardic, irregular, ext warm PULM: scattered rhonci, tachypneic GI: RUQ GB drain, pink ostomy in LLQ, abdomen without BS, soft EXT: RLE is mottled and gangrenous, dressing in place.  LLE also mottled but less so.  NEURO: remains sedated from OR PSYCH: RASS -4 SKIN: Mild pallor  ABG looks good Plts low this AM- 43, 1 unit plts transfused intraop Serum chemistries look good with  exception of phosphorus, being repleted CK remains elevated  Resolved Hospital Problem list     Assessment & Plan:  Respiratory failure in setting of ischemic bowel and ischemic leg- on vent, s/p SB resection and ostomy creation as well as RLE thrombectomy and fasciotomy. Now closed. - SAT/SBT - Try to balance pain control with alertness  RLE ischemia post fasciotomy.  S/P open AAA repair and aortobifemoral bypass- vascular following closely  S/P cholecystectomy- drain in place, management per surgery  ESRD, shock- on CRRT with nephrology following  Persistent Afib- per cardiology  Hypothyroid- continue synthroid  Worsening thrombocytopenia- transfused today, continue to trend  Best practice:  Diet: TPN Pain/Anxiety/Delirium protocol (if indicated): in place VAP protocol (if indicated): in place DVT prophylaxis: per primary GI prophylaxis: PPI Glucose control: SSI Mobility: BR Code Status: full Family Communication: per primary Disposition: ICU  The patient is critically ill with multiple organ systems failure and requires high complexity decision making for assessment and support, frequent evaluation and titration of therapies, application of advanced monitoring technologies and extensive interpretation of multiple databases. Critical Care Time devoted to patient care services described in this note independent of APP/resident time (if applicable)  is 38 minutes.   Erskine Emery MD [facility] 11/26/2019 12:13 PM Personal pager: [phone] If unanswered, please page CCM On-call: [phone]

## 2019-11-14 NOTE — Transfer of Care (Signed)
Immediate Anesthesia Transfer of Care Note  Patient: Juan Bass  Procedure(s) Performed: EXPLORATORY LAPAROTOMY, REMOVAL OF GALLBLADDER WALL (N/A Abdomen) SMALL BOWEL RESECTION (N/A Abdomen) Partial Omentectomy (N/A Abdomen) Cholecystectomy (N/A Abdomen)  Patient Location: ICU  Anesthesia Type:General  Level of Consciousness: Patient remains intubated per anesthesia plan  Airway & Oxygen Therapy: Patient placed on Ventilator (see vital sign flow sheet for setting)  Post-op Assessment: Report given to RN and Post -op Vital signs reviewed and stable  Post vital signs: Reviewed  Last Vitals:  Vitals Value Taken Time  BP    Temp    Pulse    Resp    SpO2      Last Pain:  Vitals:   11/13/19 2000  TempSrc: Esophageal  PainSc:       Patients Stated Pain Goal: 2 (52/77/82 4235)  Complications: No apparent anesthesia complications

## 2019-11-14 NOTE — Op Note (Signed)
11/07/2019  9:58 AM  PATIENT:  Juan Bass  69 y.o. 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  POST-OPERATIVE DIAGNOSIS:  Same + 1.  Cholecystitis 2.  Focal ischemia of small intestine 3.  Patchy ischemic hepatitis  PROCEDURE:   1. Reopening of recent laparotomy 2. Small bowel resection with anastomosis 3. Cholecystectomy 4. Partial omentectomy 5. Abdominal washout and closure  SURGEON:  Sharon Mt. Cuahutemoc Attar, MD  ANESTHESIA:   general  COUNTS:  Sponge, needle and instrument counts were reported correct x2 at the conclusion of the operation.  EBL: 50 mL  DRAINS: 19 Fr round blake drain left draining gallbladder fossa; existing 19 Fr blake drain in pelvis maintained  SPECIMEN:  1.  Small bowel 2.  Gallbladder wall 3.  Omentum  COMPLICATIONS: None  FINDINGS: Patchy areas of ischemic-like changes to the surface of the liver.  Gallbladder with liquefied necrosis of much of the wall consistent with ischemic-like changes and cholecystitis.  Subtotal cholecystectomy performed-infundibulum scarred and obliterated planes of tissue with fibrotic-like changes; therefore a subtotal cholecystectomy was carried out.  Spleen with a few tiny patchy areas of ischemic-like changes as well.  Overall majority of spleen appears viable.  Stomach and intraperitoneal portion of duodenum normal in appearance.  Focal area of small intestine with full-thickness necrosis of the wall but without perforation.  The segment was removed and a primary anastomosis fashioned.  This was the proximalmost portion of ileum/distal jejunum.  This was at least a foot proximal to the previous anastomosis.   Small bowel anastomosis from prior surgery viable without evidence of leak or perforation.  Rectal stump appears closed and viable.  Cecum, ascending, transverse colon all viable.  Colostomy viable.  There were some focal ischemic mucosal changes on the outermost surface of the colostomy but clearly viable mucosa even at the level of the skin.  Omentum with focal areas of ischemia that was removed.  The abdomen was washed out and closed without any major changes in his respiratory mechanics or peak airway pressures.  DISPOSITION: ICU in stable but critical condition  INDICATION: This is 69 year old male who underwent an aortic aneurysm repair on Monday.  This was complicated by an ischemic left colon - taken to OR Wednesday for this and removed his colon and left him in discontinuity.  He was taken back to the operating room on Friday where he had a small bowel resection for an area of ischemic change to the small intestine and the colostomy was brought up in the left abdomen.  Part of the rectum was removed that had progressed in its appearance and the staple line was oversewn and a drain was placed.  Given these progressive findings, his abdomen is left open.  He was to be brought back today for washout and further assessment/treatment as needed.  DESCRIPTION: The patient was identified in the ICU and taken to the OR where he was placed on the operating room table. Compression boots were placed previously but maintained in OR. General anesthesia was induced without difficulty. The colostomy appliance was removed and the overlying VAC dressing taken down. Pressure points were padded. He was then prepped and draped in the usual sterile fashion with betadine. A surgical timeout was performed indicating  the correct patient, procedure, positioning and need for antibiotics (continued perioperatively).   The bowel barrier of the VAC was removed.  The omentum was retracted and there were focal areas of ischemic  changes to this.  One tongue of this was posterior to the colostomy but was brought back up to the midline and found to be ischemic.  Partial omentectomy was carried out removing all ischemic portions of omentum using the LigaSure.  These were passed off the specimen.  Hemostasis was verified the omentum.  The stomach was normal in appearance as well as the first portion of the duodenum which was intraperitoneal.  The small intestine was evaluated from the ligament of Treitz to the ileocecal valve.  There was a focal area with full-thickness ischemic change identified it was approximately 10 in in length and was 1 ft proximal to the prior anastomosis.  This was clearly not viable.  Windows were created in the mesentery at both ends where the bowel was pink and well perfused in appearance.  GIA blue load staplers were then used to divide the small intestine at this level.  The intervening mesentery was then divided using the LigaSure.  Hemostasis was verified.  The limbs were oriented and enterotomies created at each antimesenteric staple line.  An anastomosis was then fashioned using a 75 mm blue load GIA stapler.  The anastomosis was inspected and noted to be hemostatic.  The common enterotomy was then closed with a single firing of the TA 60 blue load stapler after distracting the staple lines.  The corners of each TA staple line were then dunked using 3-0 silk.  A 3-0 silk apex stitch was placed at the tip of the anastomosis.  The anastomosis was palpated and noted to be widely patent-3 fingerbreadths.  The mesenteric defect was then carefully approximated using a running 3-0 Vicryl stitch.  The more distal small bowel anastomosis was normal in appearance without ischemia or leaking succus.  The cecum, ascending, transverse colon were all viable in appearance.  The appendix was normal in appearance.  The colon out to the colostomy was normal in appearance.  There was a few millimeters of ischemic mucosa at the  skin level of the colocutaneous anastomosis but there was still nice well-perfused mucosa even at the level of the skin.  The intraperitoneal portion of the rectum has sutures in place and they appear to be holding well.  There appeared to be no further progression of ischemia in the pelvis.  The liver had focal patchy areas of ischemic change.  The gallbladder had full thickness ischemia to much of the posterior wall with some seepage of bile that had been exuding into the tissues surrounding it.  The spleen had a few small patchy areas of ischemia but the vast majority of it appears viable.  Given the changes to the gallbladder, we proceeded with cholecystectomy.  A Bookwalter retractor was placed.  Approaching this in a dome down manner, the gallbladder was grasped and taken off the liver.  The back wall the gallbladder was liquefied and with indistinct planes.  As the infundibulum was approached, there was more fibrotic/woody changes and no visible cystic duct orifice.  Dissection in this area was somewhat tedious and so as to avoid complicating things further with any potential biliary injuries, I opted to proceed with performing a subtotal cholecystectomy.  Again, there is no visible cystic duct orifice.  Even with the gallbladder open, no visible bile was found to be exuding.  There was thick mucoid motor oil in appearance of bile within the gallbladder.  The anterior wall the gallbladder was passed off the specimen.  Hemostasis was then achieved in the posterior wall/remnants were fulgurated.  There is an approximate 1 cm stump of infundibulum remaining.  After observing this area for approximately 5 min, no bile was seen to be leaking.   The abdomen was then irrigated with warm saline.  Omentum was brought back down over the midline.  The Bookwalter retractor was removed.  The gallbladder fossa was hemostatic but given his somewhat tenuous status and receiving platelets intraoperatively, I placed a  piece of Surgicel in the gallbladder fossa.  A 19 French round Blake drain was brought out through the right upper abdominal wall and the drain left draining the gallbladder fossa.  Sponge, needle, and instrument counts were reported correct.  Midline fascia was then brought together with Kocher clamps and peak airway pressures remained satisfactory and the fascia is able to brought to the midline without undue tension.  We then proceeded to close the midline fascia with 2 running #1 looped PDS sutures.  All instrument counts were again reported correct.  The skin was left open but a dressing consisting of a moist Kerlix and ABD pad was placed.  The colostomy appliance was replaced.   He was then transferred back to a bed for transport to ICU in stable but critical condition

## 2019-11-14 NOTE — Progress Notes (Signed)
RT note-Patient back from OR, placed back on vent fwith current settings, fio2 increased by anesthesia to 60%.

## 2019-11-14 NOTE — Progress Notes (Signed)
[name], male   DOB: [birth year], 69 y.o.   MRN: [id number] S: Back from surgery where he had a cholecystectomy due to cholecystitis, further resection of small bowel due to ongoing ischemia, partial omentectomy, and closure with colostomy.  Currently off of pressors  O:BP (!) 122/55 (BP Location: Right Arm)   Pulse 99   Temp (!) 94.6 F (34.8 C) (Esophageal)   Resp (!) 21   Ht 5\' 9"  (1.753 m)   Wt 79.4 kg   SpO2 100%   BMI 25.85 kg/m   Intake/Output Summary (Last 24 hours) at 11/24/2019 1237 Last data filed at 12/02/2019 1200 Gross per 24 hour  Intake 4678.91 ml  Output 2945 ml  Net 1733.91 ml   Intake/Output: I/O last 3 completed shifts: In: 4621 [I.V.:3771.6; IV Piggyback:849.3] Out: 1884 [Emesis/NG output:900; Drains:850; Other:3217]  Intake/Output this shift:  Total I/O In: 2232 [I.V.:750; Blood:732; IV Piggyback:750] Out: 50 [Blood:50] Weight change: -2.5 kg Gen: intubated and sedated CVS: IRR no rub Resp: scattered rhonchi Abd: hypoactive Ext: marked ischemic changes of leg R>L  Recent Labs  Lab 11/09/19 0145 11/09/19 0759 11/26/2019 0531 11/11/2019 0541 12/04/2019 1240 11/11/2019 1248 11/11/19 0221 11/11/19 0237 11/11/19 1500 11/11/19 2110 11/29/2019 0340 12/06/2019 0340 11/11/2019 0812 11/07/2019 0812 11/06/2019 0950 12/05/2019 1304 11/17/2019 1644 11/13/19 0307 11/13/19 0932 11/13/19 1540 11/23/2019 0350 11/24/2019 0947  NA 139   < > 139   < > 139   < > 137   < > 136  --  138   < > 137   < > 138 138 136 138  --  135 137 138  K 3.5   < > 6.2*   < > 6.4*   < > 5.1   < > 4.4   < > 4.3   < > 4.1   < > 4.0 3.5 3.9 4.1  --  3.9 3.6 3.1*  CL 104   < > 103   < > 103   < > 100   < > 102  --  102  --  99  --   --   --  101 103  --  102 104  --   CO2 23   < > 20*   < > 19*   < > 23  --  22  --  25  --   --   --   --   --  23 24  --  23 24  --   GLUCOSE 77   < > 85   < > 88   < > 114*   < > 112*  --  236*  --  243*  --   --   --  165* 182*  --  185* 241*  --   BUN  24*   < > 33*   < > 39*   < > 31*   < > 27*  --  28*  --  31*  --   --   --  32* 26*  --  27* 31*  --   CREATININE 5.14*   < > 5.08*   < > 5.17*   < > 4.08*   < > 3.74*  --  3.27*  --  3.50*  --   --   --  3.18* 2.70*  --  2.53* 2.56*  --   ALBUMIN 2.8*   < > 3.1*   < > 2.6*   < > 2.7*   < >  2.8*  --  2.6*  --   --   --   --   --  2.6* 2.5* 2.6* 2.4* 2.0*  --   CALCIUM 7.8*   < > 7.4*   < > 7.1*   < > 6.8*  --  6.8*  --  7.3*  --   --   --   --   --  8.1* 8.2*  --  8.0* 8.2*  --   PHOS 2.9   < > 7.6*  --   --    < > 7.8*  --  7.0*  --  4.8*  --   --   --   --   --  4.7* 3.0  --  2.3* 1.5*  --   AST 303*  --  1,810*  --  2,133*  --  2,371*  --   --   --   --   --   --   --   --   --   --   --  1,659*  --   --   --   ALT 259*  --  1,016*  --  848*  --  816*  --   --   --   --   --   --   --   --   --   --   --  523*  --   --   --    < > = values in this interval not displayed.   Liver Function Tests: Recent Labs  Lab 11/27/2019 1240 11/15/2019 1613 11/11/19 0221 11/11/19 1500 11/13/19 0932 11/13/19 1540 11/09/2019 0350  AST 2,133*  --  2,371*  --  1,659*  --   --   ALT 848*  --  816*  --  523*  --   --   ALKPHOS 52  --  75  --  75  --   --   BILITOT 2.0*  --  1.7*  --  1.8*  --   --   PROT 4.5*  --  5.2*  --  5.0*  --   --   ALBUMIN 2.6*   < > 2.7*   < > 2.6* 2.4* 2.0*   < > = values in this interval not displayed.   Recent Labs  Lab 11/09/19 0145  AMYLASE 258*   No results for input(s): AMMONIA in the last 168 hours. CBC: Recent Labs  Lab 12/01/2019 0531 11/27/2019 0541 11/09/2019 1240 11/21/2019 1248 11/11/19 0221 11/11/19 0237 12/01/2019 0340 11/24/2019 0812 11/13/19 0307 11/15/2019 0350 12/06/2019 0947  WBC 6.3   < > 3.9*   < > 5.8   < > 5.6  --  6.0 7.8  --   NEUTROABS 5.1  --   --   --  5.2  --   --   --   --   --   --   HGB 9.6*   < > 10.4*   < > 10.2*   < > 8.9*   < > 9.7* 9.5* 8.2*  HCT 29.7*   < > 31.6*   < > 31.7*   < > 27.2*   < > 30.3* 29.9* 24.0*  MCV 85.1   < > 84.9  --   85.0  --  84.7  --  85.8 86.7  --   PLT 89*   < > 89*   < > 74*   < > 53*  --  59* 43*  --    < > =  values in this interval not displayed.   Cardiac Enzymes: Recent Labs  Lab 11/13/19 1540  CKTOTAL 49,509*   CBG: Recent Labs  Lab 11/13/19 2001 11/15/2019 0010 11/15/2019 0350 11/30/2019 0734 11/13/2019 1146  GLUCAP 214* 227* 233* 186* 294*    Iron Studies: No results for input(s): IRON, TIBC, TRANSFERRIN, FERRITIN in the last 72 hours. Studies/Results: No results found. . sodium chloride   Intravenous Once  . chlorhexidine  15 mL Mouth Rinse BID  . Chlorhexidine Gluconate Cloth  6 each Topical Q0600  . insulin aspart  0-20 Units Subcutaneous Q4H  . levothyroxine  25 mcg Intravenous Daily  . mouth rinse  15 mL Mouth Rinse Q2H  . pantoprazole (PROTONIX) IV  40 mg Intravenous QHS  . sodium bicarbonate  50 mEq Intravenous Once  . sodium chloride flush  10-40 mL Intracatheter Q12H    BMET    Component Value Date/Time   NA 138 11/13/2019 0947   K 3.1 (L) 11/07/2019 0947   CL 104 11/09/2019 0350   CO2 24 11/27/2019 0350   GLUCOSE 241 (H) 11/30/2019 0350   BUN 31 (H) 11/24/2019 0350   CREATININE 2.56 (H) 11/07/2019 0350   CALCIUM 8.2 (L) 11/07/2019 0350   GFRNONAA 25 (L) 11/18/2019 0350   GFRAA 29 (L) 11/11/2019 0350   CBC    Component Value Date/Time   WBC 7.8 12/02/2019 0350   RBC 3.45 (L) 11/06/2019 0350   HGB 8.2 (L) 11/09/2019 0947   HCT 24.0 (L) 11/13/2019 0947   PLT 43 (L) 11/15/2019 0350   MCV 86.7 11/17/2019 0350   MCH 27.5 11/24/2019 0350   MCHC 31.8 11/30/2019 0350   RDW 17.8 (H) 11/26/2019 0350   LYMPHSABS 0.4 (L) 11/11/2019 0221   MONOABS 0.2 11/11/2019 0221   EOSABS 0.0 11/11/2019 0221   BASOSABS 0.0 11/11/2019 0221    Assessment/Plan:  1. Atrial fibrillation with RVR and hypotension- started on amiodarone per Cardiology. 2. Shock with multisystem organ failure following AAA repair- due to ischemic/dead bowel s/p emergent exp lap and left  colectomy5/5/21.  Ongoing resection of ischemic small bowel, cholecystectomy, partial omentectomy, and some patches of ischemic changes to liver 1. Will keep even with CRRT as he is on pressorsand has had ongoing ischemic bowel. 3. AAA- s/p repair 11/27/2019 4. ESRD-currently on CRRT due to the development of shock with MOS failure.  1. 4K/2.5 Ca for dialysate at 1500 ml/hr, pre-filter 4K/2.5 Ca at 500 ml/hr and post filter replacement fluids 0K/2.5Ca at 300 ml/hr. 2. Will continue with the above combination and follow K levels later today  3. Will keep even with CRRT due to ongoing bowel ischemia. 5. PAD- ischemic BLE, s/p thrombectomy of right. Plan per [facility].  Right leg ischemia progressing. 6. Hyperkalemia-improved with CRRT and surgery. 7. VDRF/SOB-intubated 11/09/19 after cardiac arrest. His earlier SOB was likely due to ischemic bowel and the development of lactic acidosis.PCCM following now.  8. Anemia- s/p 3 units of PRBC's. Follow H/H 9. HTN-meds on hold due to shock.Continue to follow. 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 Thrombocytopenia - will need to follow  Donetta Potts, MD [facility] [phone]

## 2019-11-15 ENCOUNTER — Inpatient Hospital Stay (HOSPITAL_COMMUNITY): Payer: PPO

## 2019-11-15 ENCOUNTER — Encounter: Payer: Self-pay | Admitting: *Deleted

## 2019-11-15 DIAGNOSIS — J9809 Other diseases of bronchus, not elsewhere classified: Secondary | ICD-10-CM

## 2019-11-15 DIAGNOSIS — J9601 Acute respiratory failure with hypoxia: Secondary | ICD-10-CM

## 2019-11-15 LAB — COMPREHENSIVE METABOLIC PANEL
ALT: 259 U/L — ABNORMAL HIGH (ref 0–44)
ALT: 234 U/L — ABNORMAL HIGH (ref 0–44)
AST: 719 U/L — ABNORMAL HIGH (ref 15–41)
AST: 679 U/L — ABNORMAL HIGH (ref 15–41)
Albumin: 2.1 g/dL — ABNORMAL LOW (ref 3.5–5.0)
Albumin: 2.2 g/dL — ABNORMAL LOW (ref 3.5–5.0)
Alkaline Phosphatase: 71 U/L (ref 38–126)
Alkaline Phosphatase: 80 U/L (ref 38–126)
Anion gap: 11 (ref 5–15)
Anion gap: 13 (ref 5–15)
BUN: 39 mg/dL — ABNORMAL HIGH (ref 8–23)
BUN: 45 mg/dL — ABNORMAL HIGH (ref 8–23)
CO2: 23 mmol/L (ref 22–32)
CO2: 21 mmol/L — ABNORMAL LOW (ref 22–32)
Calcium: 7.9 mg/dL — ABNORMAL LOW (ref 8.9–10.3)
Calcium: 8.1 mg/dL — ABNORMAL LOW (ref 8.9–10.3)
Chloride: 103 mmol/L (ref 98–111)
Chloride: 101 mmol/L (ref 98–111)
Creatinine, Ser: 2.55 mg/dL — ABNORMAL HIGH (ref 0.61–1.24)
Creatinine, Ser: 2.47 mg/dL — ABNORMAL HIGH (ref 0.61–1.24)
GFR calc Af Amer: 29 mL/min — ABNORMAL LOW (ref >60)
GFR calc Af Amer: 30 mL/min — ABNORMAL LOW (ref >60)
GFR calc non Af Amer: 25 mL/min — ABNORMAL LOW (ref >60)
GFR calc non Af Amer: 26 mL/min — ABNORMAL LOW (ref >60)
Glucose, Bld: 192 mg/dL — ABNORMAL HIGH (ref 70–99)
Glucose, Bld: 237 mg/dL — ABNORMAL HIGH (ref 70–99)
Potassium: 3.2 mmol/L — ABNORMAL LOW (ref 3.5–5.1)
Potassium: 3.7 mmol/L (ref 3.5–5.1)
Sodium: 137 mmol/L (ref 135–145)
Sodium: 135 mmol/L (ref 135–145)
Total Bilirubin: 1.8 mg/dL — ABNORMAL HIGH (ref 0.3–1.2)
Total Bilirubin: 1.7 mg/dL — ABNORMAL HIGH (ref 0.3–1.2)
Total Protein: 4.5 g/dL — ABNORMAL LOW (ref 6.5–8.1)
Total Protein: 4.7 g/dL — ABNORMAL LOW (ref 6.5–8.1)

## 2019-11-15 LAB — CBC
HCT: 27.9 % — ABNORMAL LOW (ref 39.0–52.0)
Hemoglobin: 9.2 g/dL — ABNORMAL LOW (ref 13.0–17.0)
MCH: 28.4 pg (ref 26.0–34.0)
MCHC: 33 g/dL (ref 30.0–36.0)
MCV: 86.1 fL (ref 80.0–100.0)
Platelets: 78 10*3/uL — ABNORMAL LOW (ref 150–400)
RBC: 3.24 MIL/uL — ABNORMAL LOW (ref 4.22–5.81)
RDW: 18.4 % — ABNORMAL HIGH (ref 11.5–15.5)
WBC: 11 10*3/uL — ABNORMAL HIGH (ref 4.0–10.5)
nRBC: 1.3 % — ABNORMAL HIGH (ref 0.0–0.2)

## 2019-11-15 LAB — RENAL FUNCTION PANEL
Albumin: 2.1 g/dL — ABNORMAL LOW (ref 3.5–5.0)
Albumin: 2.1 g/dL — ABNORMAL LOW (ref 3.5–5.0)
Anion gap: 12 (ref 5–15)
Anion gap: 11 (ref 5–15)
BUN: 41 mg/dL — ABNORMAL HIGH (ref 8–23)
BUN: 44 mg/dL — ABNORMAL HIGH (ref 8–23)
CO2: 22 mmol/L (ref 22–32)
CO2: 23 mmol/L (ref 22–32)
Calcium: 7.9 mg/dL — ABNORMAL LOW (ref 8.9–10.3)
Calcium: 8 mg/dL — ABNORMAL LOW (ref 8.9–10.3)
Chloride: 103 mmol/L (ref 98–111)
Chloride: 101 mmol/L (ref 98–111)
Creatinine, Ser: 2.52 mg/dL — ABNORMAL HIGH (ref 0.61–1.24)
Creatinine, Ser: 2.45 mg/dL — ABNORMAL HIGH (ref 0.61–1.24)
GFR calc Af Amer: 29 mL/min — ABNORMAL LOW (ref >60)
GFR calc Af Amer: 30 mL/min — ABNORMAL LOW (ref >60)
GFR calc non Af Amer: 25 mL/min — ABNORMAL LOW (ref >60)
GFR calc non Af Amer: 26 mL/min — ABNORMAL LOW (ref >60)
Glucose, Bld: 189 mg/dL — ABNORMAL HIGH (ref 70–99)
Glucose, Bld: 235 mg/dL — ABNORMAL HIGH (ref 70–99)
Phosphorus: 1.4 mg/dL — ABNORMAL LOW (ref 2.5–4.6)
Phosphorus: 2.1 mg/dL — ABNORMAL LOW (ref 2.5–4.6)
Potassium: 3.3 mmol/L — ABNORMAL LOW (ref 3.5–5.1)
Potassium: 3.9 mmol/L (ref 3.5–5.1)
Sodium: 137 mmol/L (ref 135–145)
Sodium: 135 mmol/L (ref 135–145)

## 2019-11-15 LAB — POCT I-STAT 7, (LYTES, BLD GAS, ICA,H+H)
Acid-Base Excess: 0 mmol/L (ref 0.0–2.0)
Acid-base deficit: 1 mmol/L (ref 0.0–2.0)
Acid-base deficit: 1 mmol/L (ref 0.0–2.0)
Bicarbonate: 25.3 mmol/L (ref 20.0–28.0)
Bicarbonate: 25.3 mmol/L (ref 20.0–28.0)
Bicarbonate: 23.9 mmol/L (ref 20.0–28.0)
Calcium, Ion: 1.08 mmol/L — ABNORMAL LOW (ref 1.15–1.40)
Calcium, Ion: 1.08 mmol/L — ABNORMAL LOW (ref 1.15–1.40)
Calcium, Ion: 1.09 mmol/L — ABNORMAL LOW (ref 1.15–1.40)
HCT: 28 % — ABNORMAL LOW (ref 39.0–52.0)
HCT: 26 % — ABNORMAL LOW (ref 39.0–52.0)
HCT: 27 % — ABNORMAL LOW (ref 39.0–52.0)
Hemoglobin: 9.5 g/dL — ABNORMAL LOW (ref 13.0–17.0)
Hemoglobin: 8.8 g/dL — ABNORMAL LOW (ref 13.0–17.0)
Hemoglobin: 9.2 g/dL — ABNORMAL LOW (ref 13.0–17.0)
O2 Saturation: 74 %
O2 Saturation: 88 %
O2 Saturation: 92 %
Patient temperature: 36.4
Patient temperature: 36.3
Patient temperature: 35.9
Potassium: 3.6 mmol/L (ref 3.5–5.1)
Potassium: 3.7 mmol/L (ref 3.5–5.1)
Potassium: 3.8 mmol/L (ref 3.5–5.1)
Sodium: 136 mmol/L (ref 135–145)
Sodium: 136 mmol/L (ref 135–145)
Sodium: 136 mmol/L (ref 135–145)
TCO2: 27 mmol/L (ref 22–32)
TCO2: 27 mmol/L (ref 22–32)
TCO2: 25 mmol/L (ref 22–32)
pCO2 arterial: 45.2 mmHg (ref 32.0–48.0)
pCO2 arterial: 43.5 mmHg (ref 32.0–48.0)
pCO2 arterial: 39.3 mmHg (ref 32.0–48.0)
pH, Arterial: 7.353 (ref 7.350–7.450)
pH, Arterial: 7.369 (ref 7.350–7.450)
pH, Arterial: 7.388 (ref 7.350–7.450)
pO2, Arterial: 40 mmHg — CL (ref 83.0–108.0)
pO2, Arterial: 55 mmHg — ABNORMAL LOW (ref 83.0–108.0)
pO2, Arterial: 60 mmHg — ABNORMAL LOW (ref 83.0–108.0)

## 2019-11-15 LAB — BPAM PLATELET PHERESIS
Blood Product Expiration Date: 202105102359
Blood Product Expiration Date: 202105102359
ISSUE DATE / TIME: 202105090731
ISSUE DATE / TIME: 202105090731
Unit Type and Rh: 6200
Unit Type and Rh: 8400

## 2019-11-15 LAB — GLUCOSE, CAPILLARY
Glucose-Capillary: 162 mg/dL — ABNORMAL HIGH (ref 70–99)
Glucose-Capillary: 190 mg/dL — ABNORMAL HIGH (ref 70–99)
Glucose-Capillary: 200 mg/dL — ABNORMAL HIGH (ref 70–99)
Glucose-Capillary: 216 mg/dL — ABNORMAL HIGH (ref 70–99)
Glucose-Capillary: 222 mg/dL — ABNORMAL HIGH (ref 70–99)
Glucose-Capillary: 237 mg/dL — ABNORMAL HIGH (ref 70–99)

## 2019-11-15 LAB — PREPARE PLATELET PHERESIS
Unit division: 0
Unit division: 0

## 2019-11-15 LAB — DIFFERENTIAL
Abs Immature Granulocytes: 0.27 10*3/uL — ABNORMAL HIGH (ref 0.00–0.07)
Basophils Absolute: 0 10*3/uL (ref 0.0–0.1)
Basophils Relative: 0 %
Eosinophils Absolute: 0.2 10*3/uL (ref 0.0–0.5)
Eosinophils Relative: 1 %
Immature Granulocytes: 3 %
Lymphocytes Relative: 10 %
Lymphs Abs: 1.1 10*3/uL (ref 0.7–4.0)
Monocytes Absolute: 0.6 10*3/uL (ref 0.1–1.0)
Monocytes Relative: 5 %
Neutro Abs: 8.9 10*3/uL — ABNORMAL HIGH (ref 1.7–7.7)
Neutrophils Relative %: 81 %

## 2019-11-15 LAB — MAGNESIUM: Magnesium: 2.3 mg/dL (ref 1.7–2.4)

## 2019-11-15 LAB — PATHOLOGIST SMEAR REVIEW

## 2019-11-15 LAB — SURGICAL PATHOLOGY

## 2019-11-15 LAB — LACTIC ACID, PLASMA: Lactic Acid, Venous: 2.7 mmol/L (ref 0.5–1.9)

## 2019-11-15 LAB — PREALBUMIN: Prealbumin: 5.7 mg/dL — ABNORMAL LOW (ref 18–38)

## 2019-11-15 LAB — PHOSPHORUS: Phosphorus: 1.4 mg/dL — ABNORMAL LOW (ref 2.5–4.6)

## 2019-11-15 LAB — TRIGLYCERIDES: Triglycerides: 146 mg/dL (ref <150)

## 2019-11-15 MED ORDER — TRACE MINERALS CU-MN-SE-ZN 300-55-60-3000 MCG/ML IV SOLN
INTRAVENOUS | Status: DC
  Filled 2019-11-15 (×4): qty 996.8

## 2019-11-15 MED ORDER — PRISMASOL BGK 4/2.5 32-4-2.5 MEQ/L REPLACEMENT SOLN: Status: DC

## 2019-11-15 MED ORDER — TRACE MINERALS CU-MN-SE-ZN 300-55-60-3000 MCG/ML IV SOLN
INTRAVENOUS | Status: DC
  Filled 2019-11-15: qty 996.8

## 2019-11-15 MED ORDER — POTASSIUM PHOSPHATES 15 MMOLE/5ML IV SOLN
20.0000 mmol | Freq: Once | INTRAVENOUS | Status: AC
  Administered 2019-11-15: 20 mmol via INTRAVENOUS
  Filled 2019-11-15: qty 6.67

## 2019-11-15 MED FILL — Sodium Chloride IV Soln 0.9%: INTRAVENOUS | Qty: 1000 | Status: AC

## 2019-11-15 MED FILL — Sodium Chloride Irrigation Soln 0.9%: Qty: 3000 | Status: AC

## 2019-11-15 MED FILL — Heparin Sodium (Porcine) Inj 1000 Unit/ML: INTRAMUSCULAR | Qty: 30 | Status: AC

## 2019-11-15 NOTE — Anesthesia Postprocedure Evaluation (Signed)
Anesthesia Post Note  Patient: Juan Bass  Procedure(s) Performed: REOPENING LAPAROTOMY (N/A ) COLOSTOMY WITH ABDOMINAL CLOSURE (N/A ) Small Bowel Resection with Rectal Resection (N/A Abdomen) Application Of Wound Vac (N/A Abdomen) IRRIGATION AND DEBRIDEMENT OF RIGHT LEG (Right Leg Lower) FASCIOTOMY CLOSURE RIGHT LEG (Right Leg Lower) Application Of Wound Vac RIGHT LEG (Right Leg Lower)     Patient location during evaluation: SICU Anesthesia Type: General Level of consciousness: sedated Pain management: pain level controlled Vital Signs Assessment: post-procedure vital signs reviewed and stable Respiratory status: patient remains intubated per anesthesia plan Cardiovascular status: stable Postop Assessment: no apparent nausea or vomiting Anesthetic complications: no    Last Vitals:  Vitals:   11/15/19 1800 11/15/19 1931  BP: (!) 90/54 (!) 119/31  Pulse: 91 85  Resp: 19 18  Temp: (!) 36.2 C   SpO2: 100% 100%    Last Pain:  Vitals:   11/15/19 0000  TempSrc: Esophageal  PainSc:                  Cordie Beazley

## 2019-11-15 NOTE — Progress Notes (Addendum)
PHARMACY - TOTAL PARENTERAL NUTRITION CONSULT NOTE  Indication:  Bowel ischemia  Patient Measurements: Height: 5\' 9"  (175.3 cm) Weight: 80.9 kg (178 lb 5.6 oz) IBW/kg (Calculated) : 70.7 TPN AdjBW (KG): 75 Body mass index is 26.34 kg/m.  Assessment:   Juan Bass presented on 12/03/2019 for AAA repair with aortobifemoral bypass.  Post-op course complicated by increased WOB, hypotension and cardiac arrest.  He was taken back to the OR on 11/15/2019 for left colectomy (left in discontinuity) and VAC placement for ischemic left colon, and reopening of recent laparotomy, RLE thrombectomy and 4 compartment fasciotomies.  Pharmacy consulted to manage TPN as no oral/enteral nutrition anticipated within the next several days.  Patient is intubated and sedated - unsure of nutritional status PTA.  Glucose / Insulin: hx DM on glipizide PTA - CBGs 160-240, on mSSI with 34 units used yesterday Electrolytes: K down to 3.2 (0K bath switched to 4K bath through CRRT), Ca 7.9, Phos down to 1.4 (will replace and incr amount in the TPN while on CRRT), Mag at 2.3 Renal: ESRD, last HD session 5/3.  CRRT started 5/4.  SCr down 2.52, BUN 39 LFTs / TGs: AST/ALT 719/259, tbili 1.8, TG 146 Prealbumin / albumin: albumin 2.1, prealbumin 5.7 Intake / Output; MIVF: no UOP, NG O/P 632mL, drain 825mL  GI Imaging: none since TPN consult Surgeries / Procedures:  5/7 - closure RLE lateral fasciotomy and partial closure with VAC placement on medial RLE fasciotomy 5/7 -  Small bowel resection, additional rectum resection, end transverse colostomy 5/9 - re-ex lap with colecystectomy, SBR and abdominal washout and closure  Central access: CVC double lumen placed 11/30/2019 TPN start date: 11/11/19  Nutritional Goals (RD rec on 5/5): 1875-2250 kCal, 130-150gm protein per day  Current Nutrition:  TPN  Plan:  Continue concentrated TPN rate to goal of 70 ml/hr (goal rate of 70 ml/hr).  TPN provides 150g AA, 229g CHO and 58 gm lipid for a  total of 1900 kCal, meeting ~100% of patient needs Electrolytes in TPN: standard Na, increase Ca 12, increase Phos 8, Cl:Ac 1:2 Daily multivitamin and trace elements in TPN.  No chromium with ESRD. Monitor resistant SSI Q4H - assess response to SSI and hold off on adding insulin to TPN F/U AM labs, CBGs  KPhos 20 mmol x 1  Alanda Slim, PharmD, [place] Clinical Pharmacist Please see AMION for all Pharmacists' Contact Phone Numbers 11/15/2019, 7:04 AM

## 2019-11-15 NOTE — Procedures (Signed)
Bronchoscopy  Indication: RLL collapse  Consent: Signed in chart  Anesthesia: 122mcg fentanyl  Procedure - Timeout performed - Bronchoscope advanced through ETT - Airways examined down to subsegmental level - Following airway examination, therapeutic suctioning of bronchus intermedius, RML, and RLL performed  Findings - mucus plugging at level of bronchus intermedius successfully suctioned - ETT in good position - signs of chronic bronchitis  Specimen(s): none  Complications: none immediate  Leave in PC and check ABG after shift change.

## 2019-11-15 NOTE — Progress Notes (Addendum)
[facility] Progress Note Patient Name: Juan Bass DOB: 1950/12/09 MRN: [id number]   Date of Service  11/15/2019  HPI/Events of Note  ABG on 70%/SIMV 18/TV 560/PS 18/P 12 = 7.38/39.3/60/23. Sat is presently = 98% - 100% by oximetry.   eICU Interventions  Will order: 1. Continue present ventilator management. Will change ventilator orders to reflect current ventilator settings.      Intervention Category Major Interventions: Respiratory failure - evaluation and management  Lysle Dingwall 11/15/2019, 9:33 PM

## 2019-11-15 NOTE — Progress Notes (Signed)
[name], male   DOB: 1950/10/31, 69 y.o.   MRN: [id number]    1 Day Post-Op  Subjective: Intubated.  Just flipped back into A fib when trying to start weaning patient. BP spike to 200 and given some fentanyl to help.  Off pressors  ROS: unable on vent  Objective: Vital signs in last 24 hours: Temp:  [94.6 F (34.8 C)-100.2 F (37.9 C)] 97.9 F (36.6 C) (05/10 0800) Pulse Rate:  [83-100] 85 (05/10 0800) Resp:  [11-27] 14 (05/10 0800) BP: (114-162)/(36-62) 114/57 (05/10 0800) SpO2:  [97 %-100 %] 97 % (05/10 0800) Arterial Line BP: (92-180)/(33-49) 180/44 (05/10 0800) FiO2 (%):  [40 %-60 %] 40 % (05/10 0752) Weight:  [80.9 kg] 80.9 kg (05/10 0500) Last BM Date: 11/11/2019  Intake/Output from previous day: 05/09 0701 - 05/10 0700 In: 4819.4 [I.V.:2991.5; Blood:732; IV Piggyback:1095.9] Out: 2636 [Emesis/NG output:50; Drains:290; Blood:50] Intake/Output this shift: Total I/O In: 118.4 [I.V.:107.9; IV Piggyback:10.6] Out: 138 [Other:138]  PE: Heart: irregular, tachy Lungs, on vent Abd: soft, 2 JP drains in place with serous output, midline wound is clean, but adipose tissue is dark and necrotic.  Fascia is intact.  Colostomy is hyperemic but viable.  No output.  Lab Results:  Recent Labs    11/13/2019 0350 11/07/2019 0350 11/27/2019 0947 11/15/19 0347  WBC 7.8  --   --  11.0*  HGB 9.5*   < > 8.2* 9.2*  HCT 29.9*   < > 24.0* 27.9*  PLT 43*  --   --  78*   < > = values in this interval not displayed.   BMET Recent Labs    11/27/2019 1552 11/15/19 0347  NA 138 137  137  K 3.6 3.2*  3.3*  CL 105 103  103  CO2 24 23  22   GLUCOSE 236* 192*  189*  BUN 38* 39*  41*  CREATININE 2.64* 2.55*  2.52*  CALCIUM 7.6* 7.9*  7.9*   PT/INR Recent Labs    12/04/2019 0900  LABPROT 15.1  INR 1.2   CMP     Component Value Date/Time   NA 137 11/15/2019 0347   NA 137 11/15/2019 0347   K 3.3 (L) 11/15/2019 0347   K 3.2 (L) 11/15/2019 0347   CL 103  11/15/2019 0347   CL 103 11/15/2019 0347   CO2 22 11/15/2019 0347   CO2 23 11/15/2019 0347   GLUCOSE 189 (H) 11/15/2019 0347   GLUCOSE 192 (H) 11/15/2019 0347   BUN 41 (H) 11/15/2019 0347   BUN 39 (H) 11/15/2019 0347   CREATININE 2.52 (H) 11/15/2019 0347   CREATININE 2.55 (H) 11/15/2019 0347   CALCIUM 7.9 (L) 11/15/2019 0347   CALCIUM 7.9 (L) 11/15/2019 0347   PROT 4.5 (L) 11/15/2019 0347   ALBUMIN 2.1 (L) 11/15/2019 0347   ALBUMIN 2.1 (L) 11/15/2019 0347   AST 719 (H) 11/15/2019 0347   ALT 259 (H) 11/15/2019 0347   ALKPHOS 71 11/15/2019 0347   BILITOT 1.8 (H) 11/15/2019 0347   GFRNONAA 25 (L) 11/15/2019 0347   GFRNONAA 25 (L) 11/15/2019 0347   GFRAA 29 (L) 11/15/2019 0347   GFRAA 29 (L) 11/15/2019 0347   Lipase     Component Value Date/Time   LIPASE 43 03/18/2019 0849       Studies/Results: No results found.  Anti-infectives: Anti-infectives (From admission, onward)   Start     Dose/Rate Route Frequency Ordered Stop   11/09/19 1900  cefTRIAXone (ROCEPHIN) 2 g  in sodium chloride 0.9 % 100 mL IVPB     2 g 200 mL/hr over 30 Minutes Intravenous Every 24 hours 11/09/19 1725     11/09/19 1900  metroNIDAZOLE (FLAGYL) IVPB 500 mg     500 mg 100 mL/hr over 60 Minutes Intravenous Every 8 hours 11/09/19 1725     11/09/19 0800  ceFAZolin (ANCEF) IVPB 2g/100 mL premix     2 g 200 mL/hr over 30 Minutes Intravenous Every 8 hours 11/11/2019 1545 11/09/19 1559   11/06/2019 0625  ceFAZolin (ANCEF) IVPB 2g/100 mL premix     2 g 200 mL/hr over 30 Minutes Intravenous 30 min pre-op 11/15/2019 3267 11/11/2019 0840       Assessment/Plan vascular disease, s/p aortobifemoral bypass grafting 5/3 with subsequent thrombectomy RLE with bovine patch angioplasty of right CFA with 4 compartment fasciotomies, Dr. Carlis Abbott 5/5 VDRF DM H/O HTN ARF - on CRRT Thrombocytopenia - 78K today PCM - on TNA, prealbumin 5.7 Shock liver - patchy areas of ischemia on liver  POD 5, 3, 1, s/p left colectomy  with discontinuity and Abthera VAC placement, Dr. Donne Hazel 5/5, SBR and TC colostomy creation on 5/7, MW, SBR, cholecystectomy with patchy ischemia of liver and abdominal closure on 5/9, CW -off pressors -renal going to start pulling 0-50cc/hr on CRRT today, 2L + yesterday -no further plans to return to OR at this point. If has further demarcation of ischemia in his abdomen, not sure how much more the patient can tolerate. -cont TNA for nutritional support -cont abx therapy, WBC 11K -NS WD dressing changes BID to abdominal wound -WOC consult for ostomy care if patient becomes able to care for this  FEN - NPO/NGT/TNA VTE - heparin ID - rocephin/flagyl   LOS: 7 days    Henreitta Cea , [facility] 11/15/2019, 8:23 AM Please see Amion for pager number during day hours 7:00am-4:30pm or 7:00am -11:30am on weekends

## 2019-11-15 NOTE — Progress Notes (Signed)
NAME:  Juan Bass, MRN:  [id number], DOB:  02-12-1951, LOS: 7 ADMISSION DATE:  12/05/2019, CONSULTATION DATE: 11/09/2019 REFERRING MD: Vascular surgery, CHIEF COMPLAINT: Hypoxia hypotension  Brief History   69 year old end-stage renal disease status post AAA repair 11/09/2019 with increasing work of breathing and hypotension following surgery.  History of present illness   69 year old male with known history of end-stage renal disease with a right bicep graft along with hypertension hypothyroidism COPD with chronic long-term tobacco abuse.  Known 6 cm aortic abdominal aneurysm for which he underwent a AAA repair 11/13/2019 along with aortobifem for his vascular disease.  He was extubating in the PACU on 11/29/2019 was not initially hypertensive and required a labetalol drip underwent hemodialysis became hypotensive.  Initially placed on Neo-Synephrine drip did not elevated to a Levophed drip.  Also noted to have some hypoxia with increasing oxygen needs chest x-ray was remarkable for hypoventilation.  Pulmonary critical care was called to bedside for possible interventions.  He is able to vocalize he is not using accessory muscles at the time of examination.  But ends this weakened state and inability to take deep breaths and underlying COPD he may require intubation in the near future.  Past Medical History  End-stage renal disease COPD Diabetes Hypertension Hypothyroidism  Significant Hospital Events   11/09/2019 found to be hypoxic  Consults:  Nephro Surgery Cardiology Vascular PCCM  Procedures:  11/09/2019 status post aortobifem and AAA repair  5/7 REOPENING LAPAROTOMY COLOSTOMY WITH ABDOMINAL CLOSURE IRRIGATION AND DEBRIDEMENT OF RIGHT LEG FASCIOTOMY CLOSURE RIGHT LEG Application Of Wound Vac RIGHT LEG Small Bowel Resection with Rectal Resection  5/9 1. Reopening of recent laparotomy 2. Small bowel resection with anastomosis 3. Cholecystectomy 4. Partial omentectomy 5.  Abdominal washout and closure  Significant Diagnostic Tests:  11/09/2019 chest x-ray with decreased excursion with bilateral atelectasis  CTA C/A/P 5/4 IMPRESSION: 1. Negative for acute PE or thoracic aortic dissection. 2. Interval infrarenal aortobifem graft repair, patent. 3. 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 4. Small amount of pelvic and abdominal ascites. 5. Small left and trace right pleural effusions. 6. Right middle and lower lobe consolidation/atelectasis with probable occlusive central bronchial secretions. 7. Coronary artery disease.  Micro Data:  None  Antimicrobials:  5/4>> Ceftriaxone 5/4>> Metronidazole  Interim history/subjective:  No events. Remains encephalopathic. Goes into Afib/RVR with SBT.  Objective   Blood pressure (!) 162/63, pulse 86, temperature 97.9 F (36.6 C), resp. rate 20, height 5\' 9"  (1.753 m), weight 80.9 kg, SpO2 100 %. CVP:  [4 mmHg-15 mmHg] 4 mmHg  Vent Mode: PRVC FiO2 (%):  [40 %-50 %] 50 % Set Rate:  [22 bmp] 22 bmp Vt Set:  [560 mL] 560 mL PEEP:  [5 cmH20] 5 cmH20 Pressure Support:  [10 cmH20-15 cmH20] 10 cmH20 Plateau Pressure:  [20 cmH20-29 cmH20] 27 cmH20   Intake/Output Summary (Last 24 hours) at 11/15/2019 1203 Last data filed at 11/15/2019 1133 Gross per 24 hour  Intake 3166.94 ml  Output 2907 ml  Net 259.94 ml   Filed Weights   11/13/19 0500 11/27/2019 0500 11/15/19 0500  Weight: 81.9 kg 79.4 kg 80.9 kg    Examination: GEN: ill appearing man on vent HEENT: ETT in place, mild secretions CV: Tachycardic, irregular, ext warm PULM: scattered rhonci, tachypneic GI: RUQ GB drain, pink ostomy in LLQ, abdomen without BS, soft EXT: RLE is mottled and gangrenous, dressing in place.  LLE also mottled but less so. NEURO: withdraws  to pain, does not follow commands PSYCH: RASS 0 SKIN: Mild pallor  Plts better Various electrolyte disturbances being managed by  CRRT/nephrology   Resolved Hospital Problem list     Assessment & Plan:  Respiratory failure in setting of ischemic bowel and ischemic leg- on vent, s/p SB resection and ostomy creation as well as RLE thrombectomy and fasciotomy. Now closed. - He probably will need at least a R BKA.  Given (1) likely need to return to OR, (2) tachyarrhythmia with sedation wean (3) ongoing ileus and (4) ongoing encephalopathy, not a good candidate for extubation at this time   RLE ischemia post fasciotomy.  S/P open AAA repair and aortobifemoral bypass- vascular following closely  Metabolic encephalopathy- not suprising given clinical milieu, redirect as able, try to balance sedation with patient comfort   S/P cholecystectomy- drain in place, management per surgery  ESRD, shock- on CRRT with nephrology following  Persistent Afib- per cardiology  Hyperglycemia- management per TPN PharmD  Hypothyroid- continue synthroid  Worsening thrombocytopenia- continue to trend  Best practice:  Diet: TPN Pain/Anxiety/Delirium protocol (if indicated): in place VAP protocol (if indicated): in place DVT prophylaxis: per primary GI prophylaxis: PPI Glucose control: SSI Mobility: BR Code Status: full Family Communication: per primary Disposition: ICU  The patient is critically ill with multiple organ systems failure and requires high complexity decision making for assessment and support, frequent evaluation and titration of therapies, application of advanced monitoring technologies and extensive interpretation of multiple databases. Critical Care Time devoted to patient care services described in this note independent of APP/resident time (if applicable)  is 32 minutes.   Erskine Emery MD [facility] 11/15/2019 12:03 PM Personal pager: [phone] If unanswered, please page CCM On-call: [phone]

## 2019-11-15 NOTE — Progress Notes (Signed)
Progress Note  Patient Name: Juan Bass Date of Encounter: 11/15/2019  Primary Cardiologist: Carlyle Dolly, MD   Subjective   Intubated warmer on   Inpatient Medications    Scheduled Meds: . sodium chloride   Intravenous Once  . chlorhexidine  15 mL Mouth Rinse BID  . Chlorhexidine Gluconate Cloth  6 each Topical Q0600  . insulin aspart  0-20 Units Subcutaneous Q4H  . levothyroxine  25 mcg Intravenous Daily  . mouth rinse  15 mL Mouth Rinse Q2H  . pantoprazole (PROTONIX) IV  40 mg Intravenous QHS  . sodium bicarbonate  50 mEq Intravenous Once  . sodium chloride flush  10-40 mL Intracatheter Q12H   Continuous Infusions: .  prismasol BGK 4/2.5 500 mL/hr at 11/18/2019 2039  . sodium chloride 500 mL/hr at 11/11/19 1250  . sodium chloride    . sodium chloride    . sodium chloride 20 mL/hr at 11/19/2019 0700  . amiodarone 30 mg/hr (11/15/19 0700)  . cefTRIAXone (ROCEPHIN)  IV 2 g (11/19/2019 1940)  . dexmedetomidine    . fentaNYL infusion INTRAVENOUS 150 mcg/hr (11/15/19 0700)  . metronidazole Stopped (11/15/19 0356)  . norepinephrine (LEVOPHED) Adult infusion Stopped (11/13/19 1836)  . potassium PHOSPHATE IVPB (in mmol)    . prismasol BGK 0/2.5 300 mL/hr at 11/15/19 5361  . prismasol BGK 4/2.5 1,500 mL/hr at 11/15/19 0258  . TPN ADULT (ION) 70 mL/hr at 11/15/19 0700  . TPN ADULT (ION)    . vasopressin (PITRESSIN) infusion - *FOR SHOCK* Stopped (11/26/2019 4431)   PRN Meds: sodium chloride, sodium chloride, sodium chloride, Place/Maintain arterial line **AND** sodium chloride, acetaminophen **OR** [DISCONTINUED] acetaminophen, alteplase, fentaNYL, fentaNYL (SUBLIMAZE) injection, fentaNYL (SUBLIMAZE) injection, heparin, heparin, lidocaine (PF), lidocaine-prilocaine, midazolam, midazolam, ondansetron, pentafluoroprop-tetrafluoroeth, phenol, sodium chloride flush   Vital Signs    Vitals:   11/15/19 0430 11/15/19 0500 11/15/19 0600 11/15/19 0700  BP: (!) 145/39  (!)  122/57 (!) 121/53  Pulse: 83 83 85 85  Resp: (!) 25 16 17  (!) 22  Temp:  (!) 97.5 F (36.4 C) (!) 97.2 F (36.2 C) 97.7 F (36.5 C)  TempSrc:      SpO2:  100% 97% 98%  Weight:  80.9 kg    Height:        Intake/Output Summary (Last 24 hours) at 11/15/2019 0719 Last data filed at 11/15/2019 0700 Gross per 24 hour  Intake 4819.44 ml  Output 2636 ml  Net 2183.44 ml   Filed Weights   11/13/19 0500 11/24/2019 0500 11/15/19 0500  Weight: 81.9 kg 79.4 kg 80.9 kg    Telemetry    NSR 11/15/2019   ECG    none - Personally Reviewed  Physical Exam   GEN: intubated and sedated on fentanyl Neck: unable to assess JVD dialysis catheter left subclavian  Cardiac: IRIRR, no murmurs, rubs, or gallops.  Respiratory: Clear except occaisional scattered rales GI: Soft, nontender, non-distended  MS: No edema; No deformity. Neuro:  Nonfocal  Psych: Normal affect   Labs    Chemistry Recent Labs  Lab 11/11/19 0221 11/11/19 0237 11/13/19 0932 11/13/19 1540 11/09/2019 0350 11/15/2019 0350 11/24/2019 0947 11/27/2019 1552 11/15/19 0347  NA 137   < >  --    < > 137   < > 138 138 137  137  K 5.1   < >  --    < > 3.6   < > 3.1* 3.6 3.2*  3.3*  CL 100   < >  --    < >  104  --   --  105 103  103  CO2 23   < >  --    < > 24  --   --  24 23  22   GLUCOSE 114*   < >  --    < > 241*  --   --  236* 192*  189*  BUN 31*   < >  --    < > 31*  --   --  38* 39*  41*  CREATININE 4.08*   < >  --    < > 2.56*  --   --  2.64* 2.55*  2.52*  CALCIUM 6.8*   < >  --    < > 8.2*  --   --  7.6* 7.9*  7.9*  PROT 5.2*  --  5.0*  --   --   --   --   --  4.5*  ALBUMIN 2.7*   < > 2.6*   < > 2.0*  --   --  2.3* 2.1*  2.1*  AST 2,371*  --  1,659*  --   --   --   --   --  719*  ALT 816*  --  523*  --   --   --   --   --  259*  ALKPHOS 75  --  75  --   --   --   --   --  71  BILITOT 1.7*  --  1.8*  --   --   --   --   --  1.8*  GFRNONAA 14*   < >  --    < > 25*  --   --  24* 25*  25*  GFRAA 16*   < >  --    < >  29*  --   --  28* 29*  29*  ANIONGAP 14   < >  --    < > 9  --   --  9 11  12    < > = values in this interval not displayed.     Hematology Recent Labs  Lab 11/13/19 0307 11/13/19 0307 11/19/2019 0350 11/11/2019 0947 11/15/19 0347  WBC 6.0  --  7.8  --  11.0*  RBC 3.53*  --  3.45*  --  3.24*  HGB 9.7*   < > 9.5* 8.2* 9.2*  HCT 30.3*   < > 29.9* 24.0* 27.9*  MCV 85.8  --  86.7  --  86.1  MCH 27.5  --  27.5  --  28.4  MCHC 32.0  --  31.8  --  33.0  RDW 17.4*  --  17.8*  --  18.4*  PLT 59*  --  43*  --  78*   < > = values in this interval not displayed.    Cardiac EnzymesNo results for input(s): TROPONINI in the last 168 hours. No results for input(s): TROPIPOC in the last 168 hours.   BNP Recent Labs  Lab 11/09/19 1025  BNP 271.6*     DDimer No results for input(s): DDIMER in the last 168 hours.   Radiology    No results found.  Cardiac Studies   none  Patient Profile     69 y.o. male admitted for AAA repair complicated by necrotic bowel/leg, likely embolic now with atrial fib  Assessment & Plan    1. Atrial fib with a RVR/CVR -  Continue amiodarone  NSR this am  2. AAA repair - no distention BS not heard this am per [facility]      For questions or updates, please contact [place] Please consult [email or web address] for contact info under Cardiology/STEMI.      Signed, Jenkins Rouge, MD  11/15/2019, 7:19 AM  [name], male   DOB: 1950/09/26, 69 y.o.   MRN: [id number]

## 2019-11-15 NOTE — Progress Notes (Signed)
Nutrition Follow up  DOCUMENTATION CODES:   Not applicable  INTERVENTION:    Continue TPN to meet 100% of needs   Monitor for ability to initiate enteral nutrition   Tube feeding recommendations: -Vital 1.5 @ 20 ml/hr  -Increase per toleration to goal rate of 45 ml/hr (1080 ml) -60 ml Prostat TID  At goal TF provides: 2220 kcal, 163 grams protein, 825 ml free water.   NUTRITION DIAGNOSIS:   Increased nutrient needs related to post-op healing as evidenced by estimated needs.  Ongoing  GOAL:   Patient will meet greater than or equal to 90% of their needs   Met via TPN  MONITOR:   Diet advancement, Vent status, Skin, Labs, I & O's, Weight trends  REASON FOR ASSESSMENT:   Consult New TPN/TNA  ASSESSMENT:   Patient with PMH significant for DM, HTN, PAD, and ESRD on HD. Presents this admission with abdominal aortic aneurysm.   5/3- s/p open juxtarenal abdominal aortic aneurysm repair with aortobifemoral bypass 5/4- brief cardiac arrest, intubated  5/5- s/p L colectomy left in discontinuity, VAC placement, R lower extremity thrombectomy, R lower extremity 4 compartment fasciotomies 5/7- s/p small bowel resection, resection additional rectum, colostomy, washout R leg fasciotomy, VAC placement 5/9- s/p small bowel resection, cholecystectomy, partial omentectomy, abdominal washout/closure  RD working remotely.  Wound VAC removed from R leg. High risk for amputation. Off pressors. Pulling 0-50 ml/hr on CRRT. Awaiting bowel return. Continue TPN @ 70 ml/hr to provide 1900 kcal and 150 g protein. Consider increase to higher end of kcal if unable to initiate EN in the next few days. Phosphorus being replaced.    Admission weight: 75 kg Current weight: 80.9 kg  Patient remains intubated on ventilator support MV: 14 L/min Temp (24hrs), Avg:97.9 F (36.6 C), Min:96.1 F (35.6 C), Max:100.2 F (37.9 C)   I/O: +9,881 ml since admit  UOP: anuric NGT: 50 ml x 24  hrs CRRT: 2,246 ml x 24 hrs   Drips: sodium phosphate  Medications: SS novolog Labs: K 3.3 (L) Phosphrous 1.4 (L) LFTs elevated   Diet Order:   Diet Order            Diet NPO time specified Except for: Sips with Meds  Diet effective midnight              EDUCATION NEEDS:   Not appropriate for education at this time  Skin:  Skin Assessment: Skin Integrity Issues: Skin Integrity Issues:: Incisions, Other (Comment) Wound Vac: n/a Incisions: bilateral groin Other: R leg- open fasciotomy  Last BM:  5/5  Height:   Ht Readings from Last 1 Encounters:  11/22/2019 '5\' 9"'  (1.753 m)    Weight:   Wt Readings from Last 1 Encounters:  11/15/19 80.9 kg    BMI:  Body mass index is 26.34 kg/m.  Estimated Nutritional Needs:   Kcal:  1875-2250 kcal  Protein:  130-150 grams  Fluid:  1000 ml + UOP (liberalized on CRRT)   Mariana Single RD, LDN Clinical Nutrition Pager listed in [place]

## 2019-11-15 NOTE — Progress Notes (Signed)
Paged Nephrology MD regarding potassium of 3.2.  MD gave verbal order to change CRRT replacement fluid from 0/2.5 to 4/2.5.

## 2019-11-15 NOTE — Progress Notes (Signed)
Worsening sats and WOB. CXR with worsening RLL collapse. Will proceed with therapeutic bronch to reduce shunting.  Erskine Emery MD PCCM

## 2019-11-15 NOTE — Progress Notes (Signed)
Paged CCM d/t agonal breaths on vent. ABG reveals pO2 40 on 50% FIO2. FIO2 increased to 70, order for Xray. Will continue to monitor

## 2019-11-15 NOTE — Progress Notes (Signed)
PULMONARY / CRITICAL CARE MEDICINE   Name: Juan Bass MRN: [id number] DOB: June 23, 1951    ADMISSION DATE:  11/19/2019 CONSULTATION DATE: 11/26/2019  REFERRING MD:  Dr. Monica Martinez   CHIEF COMPLAINT:  Post-Op hyperkalemia, hypoxia. resp distres  HISTORY OF PRESENT ILLNESS:   69 year old man with end-stage renal disease on hemodialysis who underwent an uneventful aorto bifemoral bypass 5/3. Hypotensive following surgery. Also pt had increasing hypoxia and respiratory distress post op day 1 followed by brief cardiac arrest. Required intubation and initiation of vasopressors after shock.  CT chest r/o PE and aortic dissection. Revealed left colon and large bowel dilation. Upon re-evaluation the patient was unable to re-estab flow to right leg. Dopplers were negative for pulse and patient was rush for lap, emergency left colectomy and fasciotomy times 4  Post op day 3 (5/6) pt had intermittent SVT progress to a-fib. Was given amiodarone to abort and treat. Patient remains on 2 pressors and scheduled for OR 5/7 closure RLE lateral fasciotomy and partial closure with VAC placement on medial RLE fasciotomy and Small bowel resection, additional rectum resection, end transverse colostomy  Since return post op 5/7 pt has had intermittent bouts of Afib w hypotentions. Requiring pressors for maint of MAP.  PAST MEDICAL HISTORY :  He  has a past medical history of AAA (abdominal aortic aneurysm) ([place]), Chronic kidney disease, Diabetes mellitus without complication ([place]), Hypertension, Hypothyroidism, and PAD (peripheral artery disease) ([place]).  PAST SURGICAL HISTORY: He  has a past surgical history that includes Back surgery; abdominal aortagram (N/A, 02/22/2014); Spine surgery; AV fistula placement (Left, 08/06/2018); A/V Fistulagram (Left, 10/14/2018); PERIPHERAL VASCULAR BALLOON ANGIOPLASTY (Left, 10/14/2018); Abdominal aortic aneurysm repair (N/A, 11/09/2019); Thrombectomy of bypass graft femoral-  popliteal artery (Right, 11/25/2019); Abdominal aortic aneurysm repair (N/A, 11/09/2019); Patch angioplasty (Right, 11/15/2019); Colon resection sigmoid (N/A, 11/27/2019); Application if wound vac (N/A, 11/24/2019); Fasciotomy (Right, 12/06/2019); laparotomy (N/A, 11/20/2019); Colostomy (N/A, 11/19/2019); Bowel resection (N/A, 11/28/2019); Application if wound vac (N/A, 11/24/2019); I & D extremity (Right, 11/26/2019); Fasciotomy closure (Right, 11/25/2019); Application if wound vac (Right, 11/11/2019); laparotomy (N/A, 12/06/2019); Bowel resection (N/A, 11/29/2019); Omentectomy (N/A, 11/23/2019); and Cholecystectomy (N/A, 11/21/2019).  No Known Allergies  No current facility-administered medications on file prior to encounter.   Current Outpatient Medications on File Prior to Encounter  Medication Sig  . aspirin EC 81 MG tablet Take 1 tablet (81 mg total) by mouth daily with breakfast.  . Cholecalciferol (VITAMIN D3) 50 MCG (2000 UT) TABS Take 2,000 Units by mouth 4 (four) times a week.  . labetalol (NORMODYNE) 200 MG tablet Take 2 tablets (400 mg total) by mouth 2 (two) times daily. (Patient taking differently: Take 200 mg by mouth 2 (two) times daily. )  . levothyroxine (SYNTHROID) 50 MCG tablet Take 1 tablet (50 mcg total) by mouth daily before breakfast.  . acetaminophen (TYLENOL) 325 MG tablet Take 2 tablets (650 mg total) by mouth every 6 (six) hours as needed for mild pain (or Fever >/= 101).    FAMILY HISTORY:  His He indicated that his mother is deceased. He indicated that his father is deceased. He indicated that his sister is alive. He indicated that his brother is alive.   SOCIAL HISTORY: He  reports that he has been smoking cigarettes. He has a 20.00 pack-year smoking history. He has never used smokeless tobacco. He reports current alcohol use of about 1.0 standard drinks of alcohol per week. He reports that he does not use drugs.  REVIEW OF SYSTEMS:  Full Review of Systems negative except otherwise specified as  above   VITAL SIGNS: BP (!) 114/57 (BP Location: Right Arm)   Pulse 85   Temp 97.9 F (36.6 C)   Resp 14   Ht 5\' 9"  (1.753 m)   Wt 80.9 kg   SpO2 97%   BMI 26.34 kg/m   HEMODYNAMICS: CVP:  [4 mmHg-15 mmHg] 8 mmHg  VENTILATOR SETTINGS: Vent Mode: PRVC FiO2 (%):  [40 %-60 %] 40 % Set Rate:  [22 bmp] 22 bmp Vt Set:  [560 mL] 560 mL PEEP:  [5 cmH20] 5 cmH20 Pressure Support:  [10 cmH20-15 cmH20] 10 cmH20 Plateau Pressure:  [19 cmH20-29 cmH20] 27 cmH20  INTAKE / OUTPUT: I/O last 3 completed shifts: In: 6498.8 [I.V.:4470.9; Blood:732; IV Piggyback:1295.9] Out: 5284 [Emesis/NG output:700; Drains:540; XLKGM:0102; Blood:50]  PHYSICAL EXAMINATION: Physical Exam Constitutional:      General: He is sleeping.     Appearance: He is ill-appearing.     Interventions: He is sedated and intubated.  HENT:     Head: Atraumatic.     Jaw: No swelling.     Mouth/Throat:     Lips: Lesions present.     Mouth: Mucous membranes are dry.     Palate: No mass.     Comments: Lower lip lesion Eyes:     General: No scleral icterus. Neck:     Thyroid: No thyroid mass or thyromegaly.     Trachea: No tracheal deviation.  Cardiovascular:     Rate and Rhythm: Tachycardia present. Rhythm irregularly irregular.     Pulses: No decreased pulses.          Dorsalis pedis pulses are detected w/ Doppler on the right side and 1+ on the left side.       Posterior tibial pulses are detected w/ Doppler on the right side and 1+ on the left side.     Heart sounds: Heart sounds are distant. No friction rub. No gallop.   Pulmonary:     Effort: He is intubated.     Breath sounds: No stridor.  Chest:     Chest wall: No deformity.  Abdominal:     General: A surgical scar is present. The ostomy site is clean.     Palpations: Abdomen is soft.     Comments: Colostomy bag present  Musculoskeletal:     Right lower leg: Edema present.     Left lower leg: Edema present.  Feet:     Right foot:     Skin  integrity: Blister and skin breakdown present.     Left foot:     Skin integrity: Dry skin present.  Skin:    Coloration: Skin is mottled.     Comments: Mottled lower extremities bilaterally R>L  Neurological:     Mental Status: He is unresponsive.     LABS:  BMET Recent Labs  Lab 11/22/2019 0350 11/20/2019 0350 11/13/2019 0947 11/15/2019 1552 11/15/19 0347  NA 137   < > 138 138 137  137  K 3.6   < > 3.1* 3.6 3.2*  3.3*  CL 104  --   --  105 103  103  CO2 24  --   --  24 23  22   BUN 31*  --   --  38* 39*  41*  CREATININE 2.56*  --   --  2.64* 2.55*  2.52*  GLUCOSE 241*  --   --  236* 192*  189*   < > =  values in this interval not displayed.    Electrolytes Recent Labs  Lab 11/13/19 0307 11/13/19 1540 11/07/2019 0350 11/15/2019 1552 11/15/19 0347  CALCIUM 8.2*   < > 8.2* 7.6* 7.9*  7.9*  MG 2.5*  --  2.5*  --  2.3  PHOS 3.0   < > 1.5* 2.0* 1.4*  1.4*   < > = values in this interval not displayed.    CBC Recent Labs  Lab 11/13/19 0307 11/13/19 0307 11/27/2019 0350 11/21/2019 0947 11/15/19 0347  WBC 6.0  --  7.8  --  11.0*  HGB 9.7*   < > 9.5* 8.2* 9.2*  HCT 30.3*   < > 29.9* 24.0* 27.9*  PLT 59*  --  43*  --  78*   < > = values in this interval not displayed.    Coag's Recent Labs  Lab 11/27/2019 1334 11/11/2019 0531 11/23/2019 1240 11/11/19 0221 11/25/2019 0900  APTT 46*  --   --   --  42*  INR 1.3*   < > 2.1* 1.7* 1.2   < > = values in this interval not displayed.    Sepsis Markers Recent Labs  Lab 12/05/2019 1240 11/11/19 0221 11/11/19 1652  LATICACIDVEN 5.5* 3.9* 2.7*    ABG Recent Labs  Lab 12/02/2019 0950 12/05/2019 1304 12/05/2019 0947  PHART 7.350 7.383 7.351  PCO2ART 46.2 41.6 46.2  PO2ART 100 120* 120*    Liver Enzymes Recent Labs  Lab 11/11/19 0221 11/11/19 1500 11/13/19 0932 11/13/19 1540 11/20/2019 0350 11/23/2019 1552 11/15/19 0347  AST 2,371*  --  1,659*  --   --   --  719*  ALT 816*  --  523*  --   --   --  259*  ALKPHOS 75   --  75  --   --   --  71  BILITOT 1.7*  --  1.8*  --   --   --  1.8*  ALBUMIN 2.7*   < > 2.6*   < > 2.0* 2.3* 2.1*  2.1*   < > = values in this interval not displayed.    Cardiac Enzymes No results for input(s): TROPONINI, PROBNP in the last 168 hours.  Glucose Recent Labs  Lab 12/05/2019 1146 11/29/2019 1624 12/01/2019 1937 11/15/19 0021 11/15/19 0358 11/15/19 0746  GLUCAP 294* 212* 161* 190* 162* 216*    Imaging  11/09/2019 chest x-ray with decreased excursion with bilateral atelectasis  CTA C/A/P 5/4 IMPRESSION: 1. Negative for acute PE or thoracic aortic dissection. 2. Interval infrarenal aortobifem graft repair, patent. 3. 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 4. Small amount of pelvic and abdominal ascites. 5. Small left and trace right pleural effusions. 6. Right middle and lower lobe consolidation/atelectasis with probable occlusive central bronchial secretions. 7. Coronary artery disease.   ANTIBIOTICS: Anti-infectives (From admission, onward)   Start     Dose/Rate Route Frequency Ordered Stop   11/09/19 1900  cefTRIAXone (ROCEPHIN) 2 g in sodium chloride 0.9 % 100 mL IVPB     2 g 200 mL/hr over 30 Minutes Intravenous Every 24 hours 11/09/19 1725     11/09/19 1900  metroNIDAZOLE (FLAGYL) IVPB 500 mg     500 mg 100 mL/hr over 60 Minutes Intravenous Every 8 hours 11/09/19 1725     11/09/19 0800  ceFAZolin (ANCEF) IVPB 2g/100 mL premix     2 g 200 mL/hr over 30 Minutes Intravenous Every 8 hours 12/05/2019 1545 11/09/19 1559  12/04/2019 0625  ceFAZolin (ANCEF) IVPB 2g/100 mL premix     2 g 200 mL/hr over 30 Minutes Intravenous 30 min pre-op 11/13/2019 4709 11/20/2019 0840      Current Facility-Administered Medications (Endocrine & Metabolic):  .  insulin aspart (novoLOG) injection 0-20 Units .  levothyroxine (SYNTHROID, LEVOTHROID) injection 25 mcg .  vasopressin (PITRESSIN) 40 Units in sodium chloride 0.9 %  250 mL (0.16 Units/mL) infusion   Current Facility-Administered Medications (Cardiovascular):  .  [EXPIRED] amiodarone (NEXTERONE PREMIX) 360-4.14 MG/200ML-% (1.8 mg/mL) IV infusion **IN FOLLOWED-BY LINKED GROUP WITH** amiodarone (NEXTERONE PREMIX) 360-4.14 MG/200ML-% (1.8 mg/mL) IV infusion   .  norepinephrine (LEVOPHED) 16 mg in 260mL premix infusion   Current Facility-Administered Medications (Analgesics):  .  acetaminophen (TYLENOL) tablet 325-650 mg **OR**  .  fentaNYL (SUBLIMAZE) bolus via infusion 25 mcg .  fentaNYL (SUBLIMAZE) injection 25 mcg .  fentaNYL (SUBLIMAZE) injection 25-100 mcg .  fentaNYL 2522mcg in NS 266mL (45mcg/ml) infusion-PREMIX .  lidocaine (PF) (XYLOCAINE) 1 % injection 5 mL   Current Facility-Administered Medications (Hematological):  .  alteplase (CATHFLO ACTIVASE) injection 2 mg .  heparin injection 1,000-6,000 Units .  heparinized saline (2000 units/L) primer fluid for CRRT   Current Facility-Administered Medications (Other):  .   prismasol BGK 4/2.5 infusion .  0.9 %  sodium chloride infusion (Manually program via Guardrails IV Fluids) .  0.9 %  sodium chloride infusion .  cefTRIAXone (ROCEPHIN) 2 g in sodium chloride 0.9 % 100 mL IVPB .  chlorhexidine (PERIDEX) 0.12 % solution 15 mL .  Chlorhexidine Gluconate Cloth 2 % PADS 6 each .  dexmedetomidine (PRECEDEX) 400 MCG/100ML (4 mcg/mL) infusion .  lidocaine-prilocaine (EMLA) cream 1 application .  MEDLINE mouth rinse .  metroNIDAZOLE (FLAGYL) IVPB 500 mg .  midazolam (VERSED) injection 1 mg .  ondansetron (ZOFRAN) injection 4 mg .  pantoprazole (PROTONIX) injection 40 mg .  pentafluoroprop-tetrafluoroeth (GEBAUERS) aerosol 1 application .  phenol (CHLORASEPTIC) mouth spray 1 spray .  potassium PHOSPHATE 20 mmol in dextrose 5 % 500 mL infusion .  prismasol BGK 4/2.5 infusion .  sodium bicarbonate injection 50 mEq .  TPN ADULT (ION)    SIGNIFICANT EVENTS: 11/09/2019 found to be hypoxic  progressed to cardiac arrest 11/09/2019 pulmonary critical care for hypoxia 11/09/2019 Critical level hyperkalemia >7 admin calcium gluconate 12/02/2019 Required 1unit plts trans intraoperatively   Procedure(s) Performed: 5/3THROMBECTOMY OF BYPASS GRAFT OF LEG (Right Groin) 5/4 EXPLORATORY LAPAROTOMY (N/A Abdomen) Patch Angioplasty (Right Groin) Colon Resection Sigmoid (N/A Abdomen) Application Of Wound Vac (N/A Abdomen) Fasciotomy (Right Leg Lower) 5/7 Colostomy, RLE lateral fascial closure, removal of necrotic bowel  5/8 Intermittent Afib w hypotension  5/9 Reopening recent laparotomy SB resection w/ anastomosis  - Cholecystectomy  - Partial omentectomy   LINES/TUBES: Line  Post Cath / Sheath Left Arterial -- days  Fistula / Graft Left Upper arm Arteriovenous fistula 466 days  Hemodialysis Catheter Left Internal jugular Triple lumen Temporary (Non-Tunneled) 5 days  Drain  NG/OG Tube Nasogastric 16 Fr. Left nare Documented cm marking at nare/ corner of mouth 7 days  Negative Pressure Wound Therapy Leg Right 3 days  Closed System Drain 1 Right Abdomen Accordion (Hemovac) 19 Fr. 2 days  Colostomy LLQ 2 days  Closed System Drain 2 Right;Medial RUQ Bulb (JP) 19 Fr. less than 1 day  Airway  Airway 5 days  Wound  Incision (Closed) 11/29/2019 Groin Left 6 days  Incision (Closed) 11/19/2019 Groin Right 4 days  Wound / Incision (  Open or Dehisced) 12/05/2019 Incision - Open Leg Right Fasciotomy 3 days  Incision (Closed) 11/07/2019 Abdomen Other (Comment) 1 day  CVC Line  CVC Double Lumen 11/29/2019 Right Internal jugular 6 days  PIV Line  Peripheral IV 11/13/2019 Right Hand 7 days  Peripheral IV 11/09/19 Anterior;Distal;Right;Upper Arm 5 days  ART Line  Introducer Single Lumen 11/29/2019 Internal Jugular Right 6 days  Arterial Line 12/04/2019 Right Radial 2 days     DISCUSSION: 69 year old man with end-stage renal disease on hemodialysis who underwent an uneventful aorto bifemoral bypass  5/3. Postop day 7 s/p open repair of juxtarenal abdominal aortic aneurysm. Patient had done very poorly postoperatively and has required multiple procedures following. He is currently intubated on pressors with multisystem organ failure, encephalopathic and not tolerate of weaning off vasopressors.   ASSESSMENT / PLAN: Previous plans to wean vasopressors have not been tolerated. Expect delayed gastrointestinal function and patient high nutritional risk. Start TPN. Correct coagulopathy. Transfuse as needed. On examination: Patient is sedated and intubated. ET tube and OG tube in placeOG tube draining bilious liquid. There are scattered rales and intermittent vent dyssynchrony. Heart sounds are distant. Extremities mottled, there is stable bullous lesions on the dorsal surface of right foot. Pedal pulses were confirmed on doppler. RLE anterior chamber muscle appears edematous and dusky. Give the patients assessment and phys exam, pt remains critically ill and not showing signs of progression as pt continues to require ventilation w dyssynchrony, likely would not tolerate SBT. Pt is also unable to wean off vasopressors. Pt is likely going for follow up operation goal is to support in interim Palliative care is following case.   Atrial fibrillation with RVR/CVR and hypotension- started on amiodarone per Cardiology. -Cont amiodarone for rate control  -Persistent tachycardia >130 consider IV digoxin  Shock with multisystem organ failure following AAA repair-   Respiratory failure in setting of ischemic bowel and ischemic leg- Encephalopathic state cont. Pt is on vent stable SpO2. Previous attempts to wean from pain control failed. - Ordered serum ammonia  - Continue full support today - Cont vasopressin 40 Units in sodium chloride 0.9 % 250 mL infusion - Cont norepinephrine (LEVOPHED) 16 mg in 270mL premix infusion - MAP goal maintain above 65 - AM CXR - Maintain current pain control. Reassess  post op  Acute on chronic renal insufficiency-ESRD- Encephalopathy - CK remains elevated post AKI - Electrolyte repletion assisted by TPN - Continue CRRT K+ mildly hypokalemic - CTM Mg and Phosphorus - Renal fnxn panel schedule  S/P Colectomy- Pt will require further surgery- Diminished GI function assessment for nutritional status is concern. Current TPN provides 150g AA, 229g CHO and 58 gm lipid for a total of 1900 kCal, meeting ~100% of patient needs Electrolytes in TPN: standard Na, increase Ca 12, increase Phos 8, Cl:Ac 1:2 - TPN Pharmacy recs appreciated w replacement of electrolyte insuf - Cont TPN rate to goal of 70 ml/hr  - NPO, maintain OG tube  S/P cholecystectomy- drain in place w mild serosang drainage, -Surgical team to follow -CTM  RLE ischemia post fasciotomy.  S/P open AAA repair and aortobifemoral bypass-  -Vascular currently following -CTM RLE pulse  Hypothyroid- continue synthroid  Worsening thrombocytopenia- rec platelet transfusion intraop (5/9)  -CTM CBC  -Coag studies  William [facility]  11/15/2019, 9:07 AM

## 2019-11-15 NOTE — Progress Notes (Signed)
[name], male   DOB: 14-Apr-1951, 69 y.o.   MRN: [id number] S: remains off pressors-  On CRRT- no UOP- ended up positive overnight   O:BP (!) 121/53   Pulse 85   Temp 97.7 F (36.5 C)   Resp (!) 22   Ht 5\' 9"  (1.753 m)   Wt 80.9 kg   SpO2 98%   BMI 26.34 kg/m   Intake/Output Summary (Last 24 hours) at 11/15/2019 0733 Last data filed at 11/15/2019 0700 Gross per 24 hour  Intake 4819.44 ml  Output 2636 ml  Net 2183.44 ml   Intake/Output: I/O last 3 completed shifts: In: 6498.8 [I.V.:4470.9; Blood:732; IV Piggyback:1295.9] Out: 6195 [Emesis/NG output:700; Drains:540; KDTOI:7124; Blood:50]  Intake/Output this shift:  No intake/output data recorded. Weight change: 1.5 kg Gen: intubated and sedated left IJ vascath placed 5/4 CVS: IRR no rub Resp: scattered rhonchi Abd: hypoactive Ext: marked ischemic changes of leg R>L-- 2+ edema  Recent Labs  Lab 11/09/19 0145 11/09/19 0759 11/24/2019 0531 11/22/2019 0541 11/11/2019 1240 11/23/2019 1248 11/11/19 0221 11/11/19 0237 11/30/2019 0340 11/11/2019 0340 11/17/2019 5809 11/20/2019 0950 11/20/2019 1644 11/13/19 0307 11/13/19 0932 11/13/19 1540 11/25/2019 0350 11/28/2019 0947 12/04/2019 1552 11/15/19 0347  NA 139   < > 139   < > 139   < > 137   < > 138   < > 137   < > 136 138  --  135 137 138 138 137  137  K 3.5   < > 6.2*   < > 6.4*   < > 5.1   < > 4.3   < > 4.1   < > 3.9 4.1  --  3.9 3.6 3.1* 3.6 3.2*  3.3*  CL 104   < > 103   < > 103   < > 100   < > 102   < > 99  --  101 103  --  102 104  --  105 103  103  CO2 23   < > 20*   < > 19*   < > 23   < > 25  --   --   --  23 24  --  23 24  --  24 23  22   GLUCOSE 77   < > 85   < > 88   < > 114*   < > 236*   < > 243*  --  165* 182*  --  185* 241*  --  236* 192*  189*  BUN 24*   < > 33*   < > 39*   < > 31*   < > 28*   < > 31*  --  32* 26*  --  27* 31*  --  38* 39*  41*  CREATININE 5.14*   < > 5.08*   < > 5.17*   < > 4.08*   < > 3.27*   < > 3.50*  --  3.18* 2.70*  --  2.53*  2.56*  --  2.64* 2.55*  2.52*  ALBUMIN 2.8*   < > 3.1*   < > 2.6*   < > 2.7*   < > 2.6*   < >  --   --  2.6* 2.5* 2.6* 2.4* 2.0*  --  2.3* 2.1*  2.1*  CALCIUM 7.8*   < > 7.4*   < > 7.1*   < > 6.8*   < > 7.3*  --   --   --  8.1* 8.2*  --  8.0* 8.2*  --  7.6* 7.9*  7.9*  PHOS 2.9   < > 7.6*  --   --    < > 7.8*   < > 4.8*  --   --   --  4.7* 3.0  --  2.3* 1.5*  --  2.0* 1.4*  1.4*  AST 303*  --  1,810*  --  2,133*  --  2,371*  --   --   --   --   --   --   --  1,659*  --   --   --   --  719*  ALT 259*  --  1,016*  --  848*  --  816*  --   --   --   --   --   --   --  523*  --   --   --   --  259*   < > = values in this interval not displayed.   Liver Function Tests: Recent Labs  Lab 11/11/19 0221 11/11/19 1500 11/13/19 0932 11/13/19 1540 11/09/2019 0350 11/19/2019 1552 11/15/19 0347  AST 2,371*  --  1,659*  --   --   --  719*  ALT 816*  --  523*  --   --   --  259*  ALKPHOS 75  --  75  --   --   --  71  BILITOT 1.7*  --  1.8*  --   --   --  1.8*  PROT 5.2*  --  5.0*  --   --   --  4.5*  ALBUMIN 2.7*   < > 2.6*   < > 2.0* 2.3* 2.1*  2.1*   < > = values in this interval not displayed.   Recent Labs  Lab 11/09/19 0145  AMYLASE 258*   No results for input(s): AMMONIA in the last 168 hours. CBC: Recent Labs  Lab 11/27/2019 0531 11/21/2019 0541 11/11/19 0221 11/11/19 0237 11/18/2019 0340 12/05/2019 5809 11/13/19 0307 11/13/19 0307 11/09/2019 0350 12/06/2019 0947 11/15/19 0347  WBC 6.3   < > 5.8   < > 5.6  --  6.0  --  7.8  --  11.0*  NEUTROABS 5.1  --  5.2  --   --   --   --   --   --   --  8.9*  HGB 9.6*   < > 10.2*   < > 8.9*   < > 9.7*   < > 9.5* 8.2* 9.2*  HCT 29.7*   < > 31.7*   < > 27.2*   < > 30.3*   < > 29.9* 24.0* 27.9*  MCV 85.1   < > 85.0  --  84.7  --  85.8  --  86.7  --  86.1  PLT 89*   < > 74*   < > 53*  --  59*  --  43*  --  78*   < > = values in this interval not displayed.   Cardiac Enzymes: Recent Labs  Lab 11/13/19 1540  CKTOTAL 49,509*   CBG: Recent Labs   Lab 11/06/2019 1146 12/05/2019 1624 11/18/2019 1937 11/15/19 0021 11/15/19 0358  GLUCAP 294* 212* 161* 190* 162*    Iron Studies: No results for input(s): IRON, TIBC, TRANSFERRIN, FERRITIN in the last 72 hours. Studies/Results: No results found. . sodium chloride   Intravenous Once  . chlorhexidine  15 mL Mouth Rinse BID  .  Chlorhexidine Gluconate Cloth  6 each Topical Q0600  . insulin aspart  0-20 Units Subcutaneous Q4H  . levothyroxine  25 mcg Intravenous Daily  . mouth rinse  15 mL Mouth Rinse Q2H  . pantoprazole (PROTONIX) IV  40 mg Intravenous QHS  . sodium bicarbonate  50 mEq Intravenous Once  . sodium chloride flush  10-40 mL Intracatheter Q12H    BMET    Component Value Date/Time   NA 137 11/15/2019 0347   NA 137 11/15/2019 0347   K 3.3 (L) 11/15/2019 0347   K 3.2 (L) 11/15/2019 0347   CL 103 11/15/2019 0347   CL 103 11/15/2019 0347   CO2 22 11/15/2019 0347   CO2 23 11/15/2019 0347   GLUCOSE 189 (H) 11/15/2019 0347   GLUCOSE 192 (H) 11/15/2019 0347   BUN 41 (H) 11/15/2019 0347   BUN 39 (H) 11/15/2019 0347   CREATININE 2.52 (H) 11/15/2019 0347   CREATININE 2.55 (H) 11/15/2019 0347   CALCIUM 7.9 (L) 11/15/2019 0347   CALCIUM 7.9 (L) 11/15/2019 0347   GFRNONAA 25 (L) 11/15/2019 0347   GFRNONAA 25 (L) 11/15/2019 0347   GFRAA 29 (L) 11/15/2019 0347   GFRAA 29 (L) 11/15/2019 0347   CBC    Component Value Date/Time   WBC 11.0 (H) 11/15/2019 0347   RBC 3.24 (L) 11/15/2019 0347   HGB 9.2 (L) 11/15/2019 0347   HCT 27.9 (L) 11/15/2019 0347   PLT 78 (L) 11/15/2019 0347   MCV 86.1 11/15/2019 0347   MCH 28.4 11/15/2019 0347   MCHC 33.0 11/15/2019 0347   RDW 18.4 (H) 11/15/2019 0347   LYMPHSABS 1.1 11/15/2019 0347   MONOABS 0.6 11/15/2019 0347   EOSABS 0.2 11/15/2019 0347   BASOSABS 0.0 11/15/2019 0347    Assessment/Plan:  1. Atrial fibrillation with RVR and hypotension- started on amiodarone per Cardiology. 2. Shock with multisystem organ failure following  AAA repair- due to ischemic/dead bowel s/p emergent exp lap and left colectomy5/5/21.  Ongoing resection of ischemic small bowel, cholecystectomy, partial omentectomy, and some patches of ischemic changes to liver 1. Will pull some volume with CRRT as he is off pressors 3. AAA- s/p repair 11/29/2019 4. ESRD-currently on CRRT due to the development of shock with MOS failure.  1. 4K/2.5 Ca for dialysate at 1500 ml/hr, pre-filter 4K/2.5 Ca at 500 ml/hr and post filter  4K/2.5Ca at 300 ml/hr. Changes to all 4 k on 5/10  2. Will keep on CRRT due to continued instabilitybut try to pull some volume  5. PAD- ischemic BLE, s/p thrombectomy of right. Plan per [facility].  Right leg ischemia progressing. 6. Hyperkalemia-improved with CRRT and surgery. 7. VDRF/SOB-intubated 11/09/19 after cardiac arrest. His earlier SOB was likely due to ischemic bowel and the development of lactic acidosis.PCCM following now.  8. Anemia- s/p 3 units of PRBC's. Follow H/H  9. CKD MBD- low phos, hold binders-  Giving phos today  10. Thrombocytopenia - will need to follow  [place] [phone]

## 2019-11-15 NOTE — Progress Notes (Addendum)
AAA Progress Note    11/15/2019 7:07 AM 1 Day Post-Op  Subjective:  Intubated   Tm  100.2 now 97.5 272'Z-366'Y systolic HR 40'H47'Q afib 100% .25ZDG3  Gtts:   Fentanyl Amiodarone TPN  Vitals:   11/15/19 0600 11/15/19 0700  BP: (!) 122/57 (!) 121/53  Pulse: 85 85  Resp: 17 (!) 22  Temp: (!) 97.2 F (36.2 C) 97.7 F (36.5 C)  SpO2: 97% 98%    Physical Exam: Cardiac:  irregular Lungs:  intubated Abdomen:  Somewhat firm; colostomy in place and 2 JP drains present Incisions:  Bilateral groin incisions are clean and dry and healing nicely.  Right lateral fasciotomy site is closed with nylons in tact Extremities:  Brisk left peroneal>DP/PT and brisk right PT and faint peroneal   Right leg   Right medial fasciotomy site   Left foot   Left foot     CBC    Component Value Date/Time   WBC 11.0 (H) 11/15/2019 0347   RBC 3.24 (L) 11/15/2019 0347   HGB 9.2 (L) 11/15/2019 0347   HCT 27.9 (L) 11/15/2019 0347   PLT 78 (L) 11/15/2019 0347   MCV 86.1 11/15/2019 0347   MCH 28.4 11/15/2019 0347   MCHC 33.0 11/15/2019 0347   RDW 18.4 (H) 11/15/2019 0347   LYMPHSABS 1.1 11/15/2019 0347   MONOABS 0.6 11/15/2019 0347   EOSABS 0.2 11/15/2019 0347   BASOSABS 0.0 11/15/2019 0347    BMET    Component Value Date/Time   NA 137 11/15/2019 0347   NA 137 11/15/2019 0347   K 3.3 (L) 11/15/2019 0347   K 3.2 (L) 11/15/2019 0347   CL 103 11/15/2019 0347   CL 103 11/15/2019 0347   CO2 22 11/15/2019 0347   CO2 23 11/15/2019 0347   GLUCOSE 189 (H) 11/15/2019 0347   GLUCOSE 192 (H) 11/15/2019 0347   BUN 41 (H) 11/15/2019 0347   BUN 39 (H) 11/15/2019 0347   CREATININE 2.52 (H) 11/15/2019 0347   CREATININE 2.55 (H) 11/15/2019 0347   CALCIUM 7.9 (L) 11/15/2019 0347   CALCIUM 7.9 (L) 11/15/2019 0347   GFRNONAA 25 (L) 11/15/2019 0347   GFRNONAA 25 (L) 11/15/2019 0347   GFRAA 29 (L) 11/15/2019 0347   GFRAA 29 (L) 11/15/2019 0347    INR    Component Value Date/Time   INR 1.2 11/18/2019 0900     Intake/Output Summary (Last 24 hours) at 11/15/2019 0707 Last data filed at 11/15/2019 0700 Gross per 24 hour  Intake 4819.44 ml  Output 2636 ml  Net 2183.44 ml     Assessment/Plan:  69 y.o. male is s/p  Aortobifemoral bypass grafting 7 Days Post-Op  And Left colectomy/vac placement (general surgery) and thrombectomy RLE with bovine patch angioplasty of right CFA with 4 compartment fasciotomies 5 Day Post-Op And 1. Reopening of recent laparotomy 2. Small bowel resection with anastomosis 3. Cholecystectomy 4. Partial omentectomy 5. Abdominal washout and closure 1 Day Post-Op   -Vascular:  Pt with brisk left DP/PT/peroneal signals; continues to have some mottling on the left now with ischemic tips of the toes.  Right leg continues with mottling to mid thigh; wound vac removed from medial wound and muscle somewhat dusky and mild odor when vac removed.  Pt at high risk for amp.  Would continue with wet to dry dressings for now.   -Cardiac:  Irregular and in afib; pt on amiodarone gtt.  Rate is controlled.  Off pressors since Saturday evening.  -Pulmonary:  Intubated &  FiO2 down to .40. ; wean per CCM -Neuro:  RN reports he responds to pain-continues on fentanyl gtt so difficult to assess.   -Renal:  ESRD-on CRRT.  Hypokalemic and treatment per nephrology.   -GI:  Underwent small bowel resection with anastomosis yesterday with cholecystectomy and partial omentectomy.  Pt on TPN for nutrition.  -Incisions:  Bilateral groins healing nicely.  Asked RN to place gauze over left groin given this is the side of the colostomy.  Midline incision covered.  Lateral fasciotomy site closed with nylon sutures in place.  -Heme/ID:  Acute blood loss anemia is improving.  He has not received PRBC's but did receive 2 platelet phoresis packs yesterday.  WBC is increasing with low grade fever overnight.  Medial fasciotomy site with dusky appearing muscle.  Most likely will require  amputation.      Leontine Locket, PA-C [facility] [phone] 11/15/2019 7:07 AM  Pt with left gaze preference this afternoon Right leg is infarcted Left leg with several areas of ischemia Although both have doppler signals they are probably not viable Stoma is dusky  No further trips to OR per General Surgery  I have updated his family at bedside and advised them this probably is not a survivable event and if we were to continue care would need bilateral AKAs. He may have a brain injury as well.  They are going to discuss options with family tonight of continuing aggressive care or moving to a comfort pathway  Ruta Hinds, MD [facility] Office: [phone]

## 2019-11-16 ENCOUNTER — Inpatient Hospital Stay (HOSPITAL_COMMUNITY): Payer: PPO

## 2019-11-16 LAB — POCT I-STAT 7, (LYTES, BLD GAS, ICA,H+H)
Acid-base deficit: 1 mmol/L (ref 0.0–2.0)
Bicarbonate: 25.6 mmol/L (ref 20.0–28.0)
Calcium, Ion: 1.11 mmol/L — ABNORMAL LOW (ref 1.15–1.40)
HCT: 43 % (ref 39.0–52.0)
Hemoglobin: 14.6 g/dL (ref 13.0–17.0)
O2 Saturation: 89 %
Patient temperature: 96.7
Potassium: 3.7 mmol/L (ref 3.5–5.1)
Sodium: 136 mmol/L (ref 135–145)
TCO2: 27 mmol/L (ref 22–32)
pCO2 arterial: 47.8 mmHg (ref 32.0–48.0)
pH, Arterial: 7.331 — ABNORMAL LOW (ref 7.350–7.450)
pO2, Arterial: 57 mmHg — ABNORMAL LOW (ref 83.0–108.0)

## 2019-11-16 LAB — CBC
HCT: 28.3 % — ABNORMAL LOW (ref 39.0–52.0)
Hemoglobin: 9.3 g/dL — ABNORMAL LOW (ref 13.0–17.0)
MCH: 28.4 pg (ref 26.0–34.0)
MCHC: 32.9 g/dL (ref 30.0–36.0)
MCV: 86.5 fL (ref 80.0–100.0)
Platelets: 80 10*3/uL — ABNORMAL LOW (ref 150–400)
RBC: 3.27 MIL/uL — ABNORMAL LOW (ref 4.22–5.81)
RDW: 19 % — ABNORMAL HIGH (ref 11.5–15.5)
WBC: 15 10*3/uL — ABNORMAL HIGH (ref 4.0–10.5)
nRBC: 1.1 % — ABNORMAL HIGH (ref 0.0–0.2)

## 2019-11-16 LAB — BPAM RBC
Blood Product Expiration Date: 202105282359
Blood Product Expiration Date: 202105282359
ISSUE DATE / TIME: 202105070852
ISSUE DATE / TIME: 202105070852
Unit Type and Rh: 6200
Unit Type and Rh: 6200

## 2019-11-16 LAB — TYPE AND SCREEN
ABO/RH(D): AB POS
Antibody Screen: NEGATIVE
Unit division: 0
Unit division: 0

## 2019-11-16 LAB — RENAL FUNCTION PANEL
Albumin: 2 g/dL — ABNORMAL LOW (ref 3.5–5.0)
Anion gap: 11 (ref 5–15)
BUN: 46 mg/dL — ABNORMAL HIGH (ref 8–23)
CO2: 23 mmol/L (ref 22–32)
Calcium: 7.8 mg/dL — ABNORMAL LOW (ref 8.9–10.3)
Chloride: 103 mmol/L (ref 98–111)
Creatinine, Ser: 2.24 mg/dL — ABNORMAL HIGH (ref 0.61–1.24)
GFR calc Af Amer: 34 mL/min — ABNORMAL LOW (ref >60)
GFR calc non Af Amer: 29 mL/min — ABNORMAL LOW (ref >60)
Glucose, Bld: 203 mg/dL — ABNORMAL HIGH (ref 70–99)
Phosphorus: 2.3 mg/dL — ABNORMAL LOW (ref 2.5–4.6)
Potassium: 4.1 mmol/L (ref 3.5–5.1)
Sodium: 137 mmol/L (ref 135–145)

## 2019-11-16 LAB — GLUCOSE, CAPILLARY
Glucose-Capillary: 182 mg/dL — ABNORMAL HIGH (ref 70–99)
Glucose-Capillary: 190 mg/dL — ABNORMAL HIGH (ref 70–99)
Glucose-Capillary: 207 mg/dL — ABNORMAL HIGH (ref 70–99)

## 2019-11-16 LAB — AMMONIA: Ammonia: 56 umol/L — ABNORMAL HIGH (ref 9–35)

## 2019-11-16 LAB — SURGICAL PATHOLOGY

## 2019-11-16 LAB — MAGNESIUM: Magnesium: 2 mg/dL (ref 1.7–2.4)

## 2019-11-16 MED ORDER — FENTANYL CITRATE (PF) 100 MCG/2ML IJ SOLN: 50.0000 ug | INTRAMUSCULAR | Status: DC | PRN

## 2019-11-16 MED ORDER — MORPHINE SULFATE (PF) 4 MG/ML IV SOLN
4.0000 mg | INTRAVENOUS | Status: DC | PRN
  Administered 2019-11-16 (×2): 4 mg via INTRAVENOUS
  Filled 2019-11-16 (×3): qty 1

## 2019-11-16 MED ORDER — TRACE MINERALS CU-MN-SE-ZN 300-55-60-3000 MCG/ML IV SOLN
INTRAVENOUS | Status: DC
  Filled 2019-11-16: qty 996.8

## 2019-11-16 MED ORDER — POLYVINYL ALCOHOL 1.4 % OP SOLN
1.0000 [drp] | Freq: Four times a day (QID) | OPHTHALMIC | Status: DC | PRN
  Filled 2019-11-16: qty 15

## 2019-11-16 MED ORDER — SODIUM PHOSPHATES 45 MMOLE/15ML IV SOLN
10.0000 mmol | Freq: Once | INTRAVENOUS | Status: DC
  Filled 2019-11-16: qty 3.33

## 2019-11-16 MED ORDER — MORPHINE SULFATE (PF) 4 MG/ML IV SOLN
INTRAVENOUS | Status: AC
  Filled 2019-11-16: qty 1

## 2019-11-16 MED ORDER — DEXTROSE 5 % IV SOLN: INTRAVENOUS | Status: DC

## 2019-11-16 MED ORDER — GLYCOPYRROLATE 0.2 MG/ML IJ SOLN
0.2000 mg | INTRAMUSCULAR | Status: DC | PRN
  Administered 2019-11-16: 0.2 mg via INTRAVENOUS

## 2019-11-16 MED ORDER — MIDAZOLAM HCL 2 MG/2ML IJ SOLN
2.0000 mg | INTRAMUSCULAR | Status: DC | PRN
  Administered 2019-11-16: 4 mg via INTRAVENOUS
  Filled 2019-11-16 (×2): qty 4

## 2019-11-16 MED ORDER — SODIUM CHLORIDE 0.9 % IV BOLUS
1000.0000 mL | Freq: Once | INTRAVENOUS | Status: AC
  Administered 2019-11-16: 1000 mL via INTRAVENOUS

## 2019-11-16 MED ORDER — FENTANYL 2500MCG IN NS 250ML (10MCG/ML) PREMIX INFUSION: 0.0000 ug/h | INTRAVENOUS | Status: DC

## 2019-11-16 MED ORDER — DIPHENHYDRAMINE HCL 50 MG/ML IJ SOLN: 25.0000 mg | INTRAMUSCULAR | Status: DC | PRN

## 2019-11-16 MED ORDER — GLYCOPYRROLATE 1 MG PO TABS
1.0000 mg | ORAL_TABLET | ORAL | Status: DC | PRN
  Filled 2019-11-16: qty 1

## 2019-11-16 MED ORDER — ACETAMINOPHEN 325 MG PO TABS: 650.0000 mg | ORAL_TABLET | Freq: Four times a day (QID) | ORAL | Status: DC | PRN

## 2019-11-16 MED ORDER — ACETAMINOPHEN 650 MG RE SUPP: 650.0000 mg | Freq: Four times a day (QID) | RECTAL | Status: DC | PRN

## 2019-11-16 MED ORDER — GLYCOPYRROLATE 0.2 MG/ML IJ SOLN
0.2000 mg | INTRAMUSCULAR | Status: DC | PRN
  Filled 2019-11-16: qty 1

## 2019-11-16 MED ORDER — FENTANYL BOLUS VIA INFUSION
100.0000 ug | INTRAVENOUS | Status: DC | PRN
  Administered 2019-11-16 (×2): 100 ug via INTRAVENOUS
  Filled 2019-11-16: qty 100

## 2019-12-07 NOTE — Discharge Summary (Signed)
Discharge Summary    Juan Bass 10-28-1950 69 y.o. male  [id number]  Admission Date: 11/17/2019  Discharge Date: 2019-11-27  Physician: No att. providers found  Admission Diagnosis: S/P AAA repair [G86.761, Z86.79] AAA (abdominal aortic aneurysm) ([place]) [I71.4]   HPI:   This is a 69 y.o. male with history of hypertension, DM, and ESRD (dialysis M/W/F) that presents to discuss 5.5 cm AAA after CTA. As previously noted, he had a CT abdomen pelvis without contrast on 03/18/2019 and this noted aneurysm was 5.5 cm. No abdominal or back pain. Still smoking at least a pack a day. He denies any previous surgical history in his abdomen. He was evaluated for transplant given his end-stage renal disease and was not a candidate due to his aneurysm and then referred back to [facility] for further evaluation by nephrology.  Patient is well-known to our practice and has had a left brachiocephalic AV fistula by Dr. Trula Slade for access and I have performed fistulogram and intervention on this. He has also had his right iliac stenting in the setting of remote claudication.  I sent him for contrasted study with CTA to further evaluate options for repair.   Hospital Course:  The patient was admitted to the hospital and taken to the operating room on 11/26/2019 and underwent: Open juxtarenal abdominal aortic aneurysm repair with aortobifemoral bypass using bifurcated 20 mm x 10 mm Dacron graft     The pt tolerated the procedure well and was transported to the PACU in stable condition.   In the recovery room, he had good doppler signals in the right PT/peroneal and left DP/PT.  He did have some hyperkalemia and nephrology ws consulted for dialysis as he is ESRD pt.  He became hypertensive and was placed on a labetalol gtt.  He got dialysis last night for hyperkalemia.  Unable to pull volume off volume but K normal and had volatile pressures overnight.  Now pressures on the soft side.  This will be difficult  given his ESRD and will be balancing act from fluid balance standpoint.  His feet remained warm and motor and sensory in tact.   Pt had increased WOB and CXR looks wet in setting of ESRD.  CCM was consulted.   Later that day, pt was intubated.   On 12/04/2019, vascular surgery was called because the right leg has become significantly more mottled.  On exam he has splotchy areas of mottling in the thigh and lower leg.  The calf is actually warm although the foot is cooler.  He continues to have palpable femoral pulses and Doppler signals in the left foot.  He received volume last night and his Levophed has been weaned down from 38 to 66.  He is also on vasopressin.  Given his acidosis although ischemic bowel is in the differential diagnosis, he could have also embolized to the right leg.  I am not sure what is preoperative status was but if he threw significant "trash" to the leg he may have knocked off some important collaterals.  Postop day 2 status post open repair of juxtarenal abdominal aortic aneurysm.  Patient has done very poorly postoperatively and now intubated on pressors with multisystem organ failure.  No signs of bleeding and hemoglobin has been relatively stable.  He got a CTA yesterday that did not have any overt findings related to his abdomen other than a distended sigmoid colon.  No signs of PE.  At this point given continued clinical decline I think I am  obligated to perform exploratory laparotomy to rule out any ischemic bowel and also attempt thrombectomy of right leg.  This may be all pressure induced but certainly has much more mottling on the right leg and the foot is cool with no signals.  Also possible atheroembolic event given he has a lot of thrombus in his suprarenal and thoracic aorta.  I have updated family given guarded prognosis.  Potassium is improving on CRRT.  Appreciate nephrology and critical care input.   He was taken back to the operating room on 12/01/2019 and  underwent:  1.  Reopening of recent laparotomy 2.  Right lower extremity thrombectomy with bovine pericardial patch angioplasty of the right common femoral artery 3.  Right lower extremity 4 compartment fasciotomies  as well as: 1.  Left colectomy, left in discontinuity 2.  Abdominal VAC placement  On 12/02/2019, pt had increasing right calf fullness this afternoon.  We performed right leg 4 compartment fasciotomies this morning and closed the skin with staples.  I subsequently removed the staples at bedside to allow the muscle to swell.  He has good posterior tibial and peroneal signals in right foot now after right leg thrombectomy earlier this morning.  He got several units of FFP after returning from the OR for an INR of 2.1.  Seems to have stabilized now on .01 vasopressin and 6 mcg Levophed and weaning.  Some increased O2 requirements on vent and CCM may start pulling volume. Updated family at bedside.  Plan is to return to the OR with general surgery on Friday for second look and possible stoma and abdominal closure.  Hopefully can close leg fasciotomies at that time.  On 12/03/2019, he is post op day 4 status post open juxtarenal abdominal aortic aneurysm repair.  Postoperative course complicated by ischemic left colon and ischemic right leg.  He has stabilized in the ICU but remains critically ill and lactic acid is clearing.  He has had good signals in the right lower extremity.  He does have bilateral lower extremity mottling but slowly improving this partially pressure induced.  He was taken back to the operating room and underwent 1.  Washout and closure of right lower extremity lateral fasciotomy incision 2.  Washout and partial closure of right lower extremity medial fasciotomy incision with negative pressure wound VAC placement by Dr. Carlis Abbott  And  1.  Reopening of recent laparotomy 2.  Small bowel resection with primary anastomosis 3.  Resection of additional rectum 4.  Abdominal  negative pressure dressing placement 5. End transverse colostomy by Dr. Donne Hazel.   Later that day, pt had brisk right PT & peroneal doppler signal after fasciotomy closure.   11/13/2019, pt had afib and was started on amiodarone.  Cardiology was consulted.  The patient remains critically ill and has reverted back to atrial fib with a RVR in the 100-115 range. His bp remains acceptable. Note he briefly was in atrial fib a day or two ago. He has plans to revisit the OR tomorrow. His bp is tolerating amiodarone. At this point I would not aggressively try to get him back to NSR (ie would not attempt to cardiovert) as he will surely go back to atrial fib. If his rates go back up, in addition to amio, I would suggest a single dose of 0.25 mg of IV digoxin. We will follow with you.   Pt continued receiving TPN for nutrition.  The di dhave thrombocytopenia that was improving.  He continues to be on ceftriaxone  and Flagyl.    On 5/9, pt's platelets dropped to 43k.  He is not receiving any heparin.    He was taken back to the OR and underwent: 1. Reopening of recent laparotomy 2. Small bowel resection with anastomosis 3. Cholecystectomy 4. Partial omentectomy 5. Abdominal washout and closure by Dr. Dema Severin.   On 11/15/2019,  Pt with left gaze preference this afternoon Right leg is infarcted Left leg with several areas of ischemia Although both have doppler signals they are probably not viable Stoma is dusky  No further trips to OR per General Surgery  Dr. Oneida Alar updated his family at bedside and advised them this probably is not a survivable event and if we were to continue care would need bilateral AKAs. He may have a brain injury as well.  They are going to discuss options with family tonight of continuing aggressive care or moving to a comfort pathway.  On 11/21/19, pt's family pursuing comfort measures for pt.  CCM had continued conversations begun yesterday by primary team with family at  bedside.  Discussed options going forward and what Mr. Summerlin would want as a destination.  He would not want to live in a nursing home.  He would not want to live with leg amputations.  He would not want to live with a tracheostomy or on dialysis.  In light of this family would like for him to pass in peace.   Discharge Diagnosis:  S/P AAA repair [R17.356, Z86.79] AAA (abdominal aortic aneurysm) ([place]) [I71.4]  Secondary Diagnosis: Patient Active Problem List   Diagnosis Date Noted   Acute respiratory failure with hypoxia ([place])    S/P AAA repair 12/02/2019   AAA (abdominal aortic aneurysm) ([place]) 11/22/2019   Hypertension    Hypertensive urgency 03/18/2019   Discomfort-Right Foot 09/26/2014   AAA (abdominal aortic aneurysm) without rupture ([place]) 03/22/2014   Atherosclerosis of native arteries of the extremities with intermittent claudication 02/08/2014   Past Medical History:  Diagnosis Date   AAA (abdominal aortic aneurysm) ([facility])    Chronic kidney disease    Diabetes mellitus without complication ([place])    Hypertension    Hypothyroidism    PAD (peripheral artery disease) ([place])    right EIA stent 02-27-14     Disposition: deceased    Leontine Locket, PA-C [facility] [phone] 11/18/2019  1:54 PM

## 2019-12-07 NOTE — Progress Notes (Signed)
Progress Note  Patient Name: Juan Bass Date of Encounter: 11-22-19  Primary Cardiologist: Carlyle Dolly, MD   Subjective   Intubated hypothermic unfortunately legs continue to by ischemic   Inpatient Medications    Scheduled Meds: . sodium chloride   Intravenous Once  . chlorhexidine  15 mL Mouth Rinse BID  . Chlorhexidine Gluconate Cloth  6 each Topical Q0600  . insulin aspart  0-20 Units Subcutaneous Q4H  . levothyroxine  25 mcg Intravenous Daily  . mouth rinse  15 mL Mouth Rinse Q2H  . pantoprazole (PROTONIX) IV  40 mg Intravenous QHS  . sodium bicarbonate  50 mEq Intravenous Once  . sodium chloride flush  10-40 mL Intracatheter Q12H   Continuous Infusions: .  prismasol BGK 4/2.5 500 mL/hr at 11/22/2019 0331  .  prismasol BGK 4/2.5 300 mL/hr at 2019/11/22 0043  . sodium chloride 500 mL/hr at 11/11/19 1250  . sodium chloride    . sodium chloride    . sodium chloride 10 mL/hr at 11/15/19 1116  . amiodarone 30 mg/hr (11-22-2019 0800)  . cefTRIAXone (ROCEPHIN)  IV 2 g (11/15/19 2010)  . dexmedetomidine    . fentaNYL infusion INTRAVENOUS 200 mcg/hr (2019-11-22 0800)  . metronidazole Stopped (11-22-19 0340)  . prismasol BGK 4/2.5 1,500 mL/hr at 11-22-19 7253  . TPN ADULT (ION) 70 mL/hr at 2019/11/22 0800   PRN Meds: sodium chloride, sodium chloride, sodium chloride, Place/Maintain arterial line **AND** sodium chloride, acetaminophen **OR** [DISCONTINUED] acetaminophen, alteplase, fentaNYL, fentaNYL (SUBLIMAZE) injection, fentaNYL (SUBLIMAZE) injection, heparin, heparin, lidocaine (PF), lidocaine-prilocaine, midazolam, midazolam, ondansetron, pentafluoroprop-tetrafluoroeth, phenol, sodium chloride flush   Vital Signs    Vitals:   Nov 22, 2019 0600 2019/11/22 0700 2019/11/22 0725 Nov 22, 2019 0800  BP: (!) 86/56 (!) 89/57 (!) 93/40 (!) 95/56  Pulse: 82 90 96 93  Resp: 15 16 18 16   Temp: (!) 93.9 F (34.4 C) (!) 94.5 F (34.7 C)  (!) 95.4 F (35.2 C)  TempSrc:      SpO2:  92% 94% 100% 100%  Weight:      Height:        Intake/Output Summary (Last 24 hours) at Nov 22, 2019 0818 Last data filed at 2019/11/22 0800 Gross per 24 hour  Intake 4398.32 ml  Output 4015 ml  Net 383.32 ml   Filed Weights   11/09/2019 0500 11/15/19 0500 Nov 22, 2019 0500  Weight: 79.4 kg 80.9 kg 81.7 kg    Telemetry    NSR Nov 22, 2019   ECG    none - Personally Reviewed  Physical Exam   GEN: intubated and sedated on fentanyl Neck: unable to assess JVD dialysis catheter left subclavian  Cardiac: IRIRR, no murmurs, rubs, or gallops.  Respiratory: Clear except occaisional scattered rales GI: post abdominal surgery  MS: No edema; No deformity. Neuro:  Nonfocal  Ischemic bilateral LE;s   Labs    Chemistry Recent Labs  Lab 11/13/19 0932 11/13/19 1540 11/15/19 0347 11/15/19 0347 11/15/19 1622 11/15/19 1623 11/15/19 1949 11-22-19 0419 22-Nov-2019 0456  NA  --    < > 137  137   < > 135  135   < > 136 137 136  K  --    < > 3.2*  3.3*   < > 3.9  3.7   < > 3.8 4.1 3.7  CL  --    < > 103  103  --  101  101  --   --  103  --   CO2  --    < >  23  22  --  23  21*  --   --  23  --   GLUCOSE  --    < > 192*  189*  --  235*  237*  --   --  203*  --   BUN  --    < > 39*  41*  --  44*  45*  --   --  46*  --   CREATININE  --    < > 2.55*  2.52*  --  2.45*  2.47*  --   --  2.24*  --   CALCIUM  --    < > 7.9*  7.9*  --  8.0*  8.1*  --   --  7.8*  --   PROT 5.0*  --  4.5*  --  4.7*  --   --   --   --   ALBUMIN 2.6*   < > 2.1*  2.1*  --  2.1*  2.2*  --   --  2.0*  --   AST 1,659*  --  719*  --  679*  --   --   --   --   ALT 523*  --  259*  --  234*  --   --   --   --   ALKPHOS 75  --  71  --  80  --   --   --   --   BILITOT 1.8*  --  1.8*  --  1.7*  --   --   --   --   GFRNONAA  --    < > 25*  25*  --  26*  26*  --   --  29*  --   GFRAA  --    < > 29*  29*  --  30*  30*  --   --  34*  --   ANIONGAP  --    < > 11  12  --  11  13  --   --  11  --    < > = values in  this interval not displayed.     Hematology Recent Labs  Lab 12/02/2019 0350 11/19/2019 0947 11/15/19 0347 11/15/19 1623 11/15/19 1949 12-10-2019 0419 12-10-19 0456  WBC 7.8  --  11.0*  --   --  15.0*  --   RBC 3.45*  --  3.24*  --   --  3.27*  --   HGB 9.5*   < > 9.2*   < > 9.2* 9.3* 14.6  HCT 29.9*   < > 27.9*   < > 27.0* 28.3* 43.0  MCV 86.7  --  86.1  --   --  86.5  --   MCH 27.5  --  28.4  --   --  28.4  --   MCHC 31.8  --  33.0  --   --  32.9  --   RDW 17.8*  --  18.4*  --   --  19.0*  --   PLT 43*  --  78*  --   --  80*  --    < > = values in this interval not displayed.    Cardiac EnzymesNo results for input(s): TROPONINI in the last 168 hours. No results for input(s): TROPIPOC in the last 168 hours.   BNP Recent Labs  Lab 11/09/19 1025  BNP 271.6*     DDimer No results for input(s): DDIMER in the last  168 hours.   Radiology    DG CHEST PORT 1 VIEW  Result Date: 11/15/2019 CLINICAL DATA:  Hypoxia EXAM: PORTABLE CHEST 1 VIEW COMPARISON:  11/11/2019 FINDINGS: Cardiac shadow is stable. Endotracheal tube and gastric catheter are seen. Left temporary dialysis catheter is noted in the proximal superior vena cava. Right jugular sheath is again seen. Small right-sided pleural effusion is again noted. Increasing right basilar atelectasis is seen. Some left retrocardiac density is noted as well as a small left effusion somewhat greater than that noted on the prior exam. IMPRESSION: Increasing bibasilar consolidation/atelectasis. Small bilateral pleural effusions are noted right greater than left. Tubes and lines as described. Electronically Signed   By: Inez Catalina M.D.   On: 11/15/2019 17:03    Cardiac Studies   none  Patient Profile     69 y.o. male admitted for AAA repair complicated by necrotic bowel/leg, likely embolic now with atrial fib  Assessment & Plan    1. Atrial fib with a RVR/CVR -  Continue amiodarone  NSR this am  2. AAA repair - no distention BS not  heard this am per [facility] Has ischemic legs and will need bilateral AKAls if family chooses Suspect this is terminal event Getting CRRT liberalizing volume no urine output multisystem failure post surgery      For questions or updates, please contact [facility] Please consult [email or web address] for contact info under Cardiology/STEMI.      Signed, Jenkins Rouge, MD  12-09-2019, 8:18 AM  Patient ID: [name], male   DOB: 02-Jul-1951, 69 y.o.   MRN: [id number]

## 2019-12-07 NOTE — Progress Notes (Signed)
[name], male   DOB: February 23, 1951, 69 y.o.   MRN: [id number]   S: remains off pressors-  On CRRT- no UOP- ended up positive overnight -  BP is low- req inc in fio2 overnight- now on 100-  Hypothermic-  Vascular thinks would need bilat amputations-  Also not out of the woods as far as his abdominal wound  O:BP (!) 86/56   Pulse 82   Temp (!) 93.9 F (34.4 C)   Resp 15   Ht 5\' 9"  (1.753 m)   Wt 81.7 kg   SpO2 92%   BMI 26.60 kg/m   Intake/Output Summary (Last 24 hours) at Dec 14, 2019 0721 Last data filed at Dec 14, 2019 0700 Gross per 24 hour  Intake 4411.35 ml  Output 3987 ml  Net 424.35 ml   Intake/Output: I/O last 3 completed shifts: In: 6041.1 [I.V.:3880.3; IV Piggyback:2160.9] Out: 5549 [Emesis/NG output:800; Drains:140; NFAOZ:3086]  Intake/Output this shift:  No intake/output data recorded. Weight change: 0.8 kg Gen: intubated and sedated left IJ vascath placed 5/4 CVS: IRR no rub Resp: scattered rhonchi Abd: hypoactive Ext: marked ischemic changes of leg R>L-- 2+ edema-  Left upper AVF is patent  Recent Labs  Lab 12/06/2019 0531 11/23/2019 0541 11/28/2019 1240 12/05/2019 1248 11/11/19 0221 11/11/19 0237 11/13/19 0307 11/13/19 0307 11/13/19 0932 11/13/19 1540 11/13/19 1540 12/04/2019 0350 11/24/2019 0947 12/04/2019 1552 12/01/2019 1552 11/15/19 0347 11/15/19 1622 11/15/19 1623 11/15/19 1754 11/15/19 1949 12-14-19 0419 14-Dec-2019 0456  NA 139   < > 139   < > 137   < > 138   < >  --  135   < > 137   < > 138   < > 137  137 135  135 136 136 136 137 136  K 6.2*   < > 6.4*   < > 5.1   < > 4.1   < >  --  3.9   < > 3.6   < > 3.6   < > 3.2*  3.3* 3.9  3.7 3.6 3.7 3.8 4.1 3.7  CL 103   < > 103   < > 100   < > 103  --   --  102  --  104  --  105  --  103  103 101  101  --   --   --  103  --   CO2 20*   < > 19*   < > 23   < > 24  --   --  23  --  24  --  24  --  23  22 23   21*  --   --   --  23  --   GLUCOSE 85   < > 88   < > 114*   < > 182*  --   --  185*   --  241*  --  236*  --  192*  189* 235*  237*  --   --   --  203*  --   BUN 33*   < > 39*   < > 31*   < > 26*  --   --  27*  --  31*  --  38*  --  39*  41* 44*  45*  --   --   --  46*  --   CREATININE 5.08*   < > 5.17*   < > 4.08*   < > 2.70*  --   --  2.53*  --  2.56*  --  2.64*  --  2.55*  2.52* 2.45*  2.47*  --   --   --  2.24*  --   ALBUMIN 3.1*   < > 2.6*   < > 2.7*   < > 2.5*   < > 2.6* 2.4*  --  2.0*  --  2.3*  --  2.1*  2.1* 2.1*  2.2*  --   --   --  2.0*  --   CALCIUM 7.4*   < > 7.1*   < > 6.8*   < > 8.2*  --   --  8.0*  --  8.2*  --  7.6*  --  7.9*  7.9* 8.0*  8.1*  --   --   --  7.8*  --   PHOS 7.6*  --   --    < > 7.8*   < > 3.0  --   --  2.3*  --  1.5*  --  2.0*  --  1.4*  1.4* 2.1*  --   --   --  2.3*  --   AST 1,810*  --  2,133*  --  2,371*  --   --   --  1,659*  --   --   --   --   --   --  719* 679*  --   --   --   --   --   ALT 1,016*  --  848*  --  816*  --   --   --  523*  --   --   --   --   --   --  259* 234*  --   --   --   --   --    < > = values in this interval not displayed.   Liver Function Tests: Recent Labs  Lab 11/13/19 0932 11/13/19 1540 11/15/19 0347 11/15/19 1622 Dec 12, 2019 0419  AST 1,659*  --  719* 679*  --   ALT 523*  --  259* 234*  --   ALKPHOS 75  --  71 80  --   BILITOT 1.8*  --  1.8* 1.7*  --   PROT 5.0*  --  4.5* 4.7*  --   ALBUMIN 2.6*   < > 2.1*  2.1* 2.1*  2.2* 2.0*   < > = values in this interval not displayed.   No results for input(s): LIPASE, AMYLASE in the last 168 hours. Recent Labs  Lab 2019-12-12 0422  AMMONIA 56*   CBC: Recent Labs  Lab 11/11/2019 0531 11/21/2019 0541 11/11/19 0221 11/11/19 0237 11/15/2019 0340 11/29/2019 6295 11/13/19 0307 11/13/19 0307 11/29/2019 0350 11/07/2019 0947 11/15/19 0347 11/15/19 1623 11/15/19 1949 2019/12/12 0419 12-Dec-2019 0456  WBC 6.3   < > 5.8   < > 5.6  --  6.0   < > 7.8  --  11.0*  --   --  15.0*  --   NEUTROABS 5.1  --  5.2  --   --   --   --   --   --   --  8.9*  --   --   --   --    HGB 9.6*   < > 10.2*   < > 8.9*   < > 9.7*   < > 9.5*   < > 9.2*   < > 9.2* 9.3* 14.6  HCT 29.7*   < > 31.7*   < >  27.2*   < > 30.3*   < > 29.9*   < > 27.9*   < > 27.0* 28.3* 43.0  MCV 85.1   < > 85.0   < > 84.7  --  85.8  --  86.7  --  86.1  --   --  86.5  --   PLT 89*   < > 74*   < > 53*  --  59*   < > 43*  --  78*  --   --  80*  --    < > = values in this interval not displayed.   Cardiac Enzymes: Recent Labs  Lab 11/13/19 1540  CKTOTAL 49,509*   CBG: Recent Labs  Lab 11/15/19 1142 11/15/19 1524 11/15/19 2002 11-23-2019 0037 11/23/19 0456  GLUCAP 237* 222* 200* 182* 190*    Iron Studies: No results for input(s): IRON, TIBC, TRANSFERRIN, FERRITIN in the last 72 hours. Studies/Results: DG CHEST PORT 1 VIEW  Result Date: 11/15/2019 CLINICAL DATA:  Hypoxia EXAM: PORTABLE CHEST 1 VIEW COMPARISON:  11/11/2019 FINDINGS: Cardiac shadow is stable. Endotracheal tube and gastric catheter are seen. Left temporary dialysis catheter is noted in the proximal superior vena cava. Right jugular sheath is again seen. Small right-sided pleural effusion is again noted. Increasing right basilar atelectasis is seen. Some left retrocardiac density is noted as well as a small left effusion somewhat greater than that noted on the prior exam. IMPRESSION: Increasing bibasilar consolidation/atelectasis. Small bilateral pleural effusions are noted right greater than left. Tubes and lines as described. Electronically Signed   By: Inez Catalina M.D.   On: 11/15/2019 17:03   . sodium chloride   Intravenous Once  . chlorhexidine  15 mL Mouth Rinse BID  . Chlorhexidine Gluconate Cloth  6 each Topical Q0600  . insulin aspart  0-20 Units Subcutaneous Q4H  . levothyroxine  25 mcg Intravenous Daily  . mouth rinse  15 mL Mouth Rinse Q2H  . pantoprazole (PROTONIX) IV  40 mg Intravenous QHS  . sodium bicarbonate  50 mEq Intravenous Once  . sodium chloride flush  10-40 mL Intracatheter Q12H    BMET    Component  Value Date/Time   NA 136 2019-11-23 0456   K 3.7 2019/11/23 0456   CL 103 11/23/19 0419   CO2 23 11-23-19 0419   GLUCOSE 203 (H) 11-23-2019 0419   BUN 46 (H) Nov 23, 2019 0419   CREATININE 2.24 (H) 2019/11/23 0419   CALCIUM 7.8 (L) 11-23-2019 0419   GFRNONAA 29 (L) 11/23/2019 0419   GFRAA 34 (L) 11-23-19 0419   CBC    Component Value Date/Time   WBC 15.0 (H) 11-23-2019 0419   RBC 3.27 (L) 11-23-2019 0419   HGB 14.6 11-23-2019 0456   HCT 43.0 23-Nov-2019 0456   PLT 80 (L) 11-23-19 0419   MCV 86.5 11/23/2019 0419   MCH 28.4 11-23-2019 0419   MCHC 32.9 23-Nov-2019 0419   RDW 19.0 (H) 11/23/19 0419   LYMPHSABS 1.1 11/15/2019 0347   MONOABS 0.6 11/15/2019 0347   EOSABS 0.2 11/15/2019 0347   BASOSABS 0.0 11/15/2019 0347    Assessment/Plan:  1. Atrial fibrillation with RVR and hypotension- started on amiodarone per Cardiology. 2. Shock with multisystem organ failure following AAA repair- due to ischemic bowel s/p emergent exp lap and left colectomy5/5/21.  Ongoing resection of ischemic small bowel, cholecystectomy, partial omentectomy, and some patches of ischemic changes to liver 1. Will pull some volume with CRRT as he is off pressors 3.  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 ESRD-currently on CRRT due to the development of shock with MOS failure.  1. 4K/2.5 Ca for dialysate at 1500 ml/hr, pre-filter 4K/2.5 Ca at 500 ml/hr and post filter  4K/2.5Ca at 300 ml/hr. Changed to all 4 k on 5/10  2. Will keep on CRRT due to continued instabilitybut try to pull some volume  5. PAD- ischemic BLE, s/p thrombectomy of right. Plan per VVS.  Right leg ischemia progressing would need amputation. 6. Hyperkalemia-improved with CRRT and surgery. 7. VDRF/SOB-intubated 11/09/19 after cardiac arrest. His earlier SOB was likely due to ischemic bowel and the development of lactic acidosis.PCCM following now.  8. Anemia- s/p 3 units of PRBC's. Follow H/H-   9. CKD MBD- low phos-  Phos stable  for now 10. Thrombocytopenia - stable 11. Dispo-  S/p GOC discussion-  Ongoing   Louis Meckel  [employer] [phone]

## 2019-12-07 NOTE — Progress Notes (Addendum)
PHARMACY - TOTAL PARENTERAL NUTRITION CONSULT NOTE  Indication:  Bowel ischemia  Patient Measurements: Height: 5\' 9"  (175.3 cm) Weight: 81.7 kg (180 lb 1.9 oz) IBW/kg (Calculated) : 70.7 TPN AdjBW (KG): 75 Body mass index is 26.6 kg/m.  Assessment:   42 YOM presented on 11/30/2019 for AAA repair with aortobifemoral bypass.  Post-op course complicated by increased WOB, hypotension and cardiac arrest.  He was taken back to the OR on 11/15/2019 for left colectomy (left in discontinuity) and VAC placement for ischemic left colon, and reopening of recent laparotomy, RLE thrombectomy and 4 compartment fasciotomies.  Pharmacy consulted to manage TPN as no oral/enteral nutrition anticipated within the next several days.  Patient is intubated and sedated, now off vasopressors - unsure of nutritional status PTA. Per Vascular, would need bilateral AKA.  Glucose / Insulin: hx DM (A1c 6.7) on glipizide PTA - CBGs improved, remain slightly above goal 182-203, on rSSI with 33 units used in the last 24 hrs Electrolytes: K improved to 3.7 (goal >/=4 with ileus; CRRT to all 4K on 5/10), Phos up to 2.3 (s/p Phos 36mmol x 1 + increased in TPN), ionized Ca slightly low 1.11; others WNL Renal: ESRD, last HD session 5/3. CRRT started 5/4 - Renal to start pulling some volume with CRRT on 5/10 LFTs / TGs: Shock liver, resolving - AST/ALT down to 679/234, Tbili 1.7 stable, TG WNL Prealbumin / albumin: Albumin 2.0, Prealbumin down to 5.7 Intake / Output; MIVF: no UOP, NG O/P 810mL, drain 171mL GI Imaging: none since TPN consult Surgeries / Procedures:  5/7 - closure RLE lateral fasciotomy and partial closure with VAC placement on medial RLE fasciotomy 5/7 -  Small bowel resection, additional rectum resection, end transverse colostomy 5/9 - re-ex lap with colecystectomy, SBR and abdominal washout and closure  Central access: CVC double lumen placed 11/23/2019 TPN start date: 11/11/19  Nutritional Goals (per RD rec on  5/5): 1875-2250 kCal, 130-150gm protein per day  Current Nutrition:  TPN NPO  Plan:  Continue concentrated TPN rate at goal rate 70 ml/hr TPN provides 150g AA, 228g CHO and 57 gm SMOF lipids, for total of 1947 kCal, meeting 100% of patient needs Electrolytes in TPN: standard Na, Ca 10 mEq/L, Phos 8 mmol/L, Cl:Ac 1:2 Daily multivitamin and trace elements in TPN. No chromium with ESRD on RRT Continue resistant SSI Q4H - monitor CBGs and adjust as needed. Will add insulin to TPN bag tomorrow if CBGs remain >180 F/U AM labs, further plans for procedures  NaPhos 10 mmol x 1   Arturo Morton, PharmD, BCPS Please check AMION for all [facility] contact numbers Clinical Pharmacist 11-27-19 7:34 AM    ADDENDUM:  Patient transitioning to full comfort care. Terminally extubated this morning to RA. D/c TPN orders per discussion with Trauma PA Saverio Danker.   Arturo Morton, PharmD, BCPS Please check AMION for all [facility] contact numbers Clinical Pharmacist 2019-11-27 11:21 AM

## 2019-12-07 NOTE — Progress Notes (Signed)
Time of Death 11:30 pronounced by this RN and Beckie Salts RN

## 2019-12-07 NOTE — Consult Note (Signed)
WOC Nurse Consult Note: Patient receiving care in [place]. Reason for Consult: "DTI to sacrum" Wound type: Pressure Injury POA: Yes/No/NA Measurement: Wound bed: Drainage (amount, consistency, odor)  Periwound: Dressing procedure/placement/frequency: Per the primary RN, Heather, family has decided to withdraw support and move to full comfort care.  Therefore, I did not view the area in question. Val Riles, RN, MSN, CWOCN, CNS-BC, pager [phone]

## 2019-12-07 NOTE — Progress Notes (Signed)
PCCM Note S: Seen in f/u for multiorgan failure after AAA repair.  No acute events overnight.  O: Remains agonally breathing on vent. O2 gradient continues to be high, now on 100% FiO2. Remains on CRRT CXR showing continued RLL collapse Legs both appear ischemic Ostomy site looking less pink, some eschar  A/P: Multiorgan failure with leg ischemia, recurrent gut ischemia, renal failure, encephalopathy.  Continued conversations begun yesterday by primary team with family at bedside.  Discussed options going forward and what Mr. Juan Bass would want as a destination.  He would not want to live in a nursing home.  He would not want to live with leg amputations.  He would not want to live with a tracheostomy or on dialysis.  In light of this family would like for him to pass in peace.  Please call if further questions or concerns.  Erskine Emery MD PCCM

## 2019-12-07 NOTE — Progress Notes (Signed)
[facility] Progress Note Patient Name: Juan Bass DOB: [birth year] MRN: [id number]   Date of Service  Dec 09, 2019  HPI/Events of Note  Hypotension - BP = 86/59 with MAP = 69. LVEF = 60-65%. CVP = 5.   eICU Interventions  Will order: 1. Bolus with 0.9 NaCl 1 liter IV over 1 hour now.      Intervention Category Major Interventions: Hypotension - evaluation and management  Jachob Mcclean Eugene 2019-12-09, 12:52 AM

## 2019-12-07 NOTE — Progress Notes (Signed)
[name], male   DOB: February 27, 1951, 69 y.o.   MRN: [id number]    2 Days Post-Op  Subjective: Events overnight noted.  Patient required bronch for RLL mucous plugging.  Vent with 12 peep and 100% FiO2.  Patient more hypotensive today.  Responds to painful stimuli but not much else.  2 sons at bedside this am.  ROS: unable, on vent  Objective: Vital signs in last 24 hours: Temp:  [93.9 F (34.4 C)-97.9 F (36.6 C)] 95.4 F (35.2 C) (05/11 0800) Pulse Rate:  [72-96] 93 (05/11 0800) Resp:  [12-22] 16 (05/11 0800) BP: (86-162)/(31-64) 95/56 (05/11 0800) SpO2:  [91 %-100 %] 100 % (05/11 0800) Arterial Line BP: (83-218)/(18-62) 97/40 (05/11 0800) FiO2 (%):  [40 %-100 %] 100 % (05/11 0800) Weight:  [81.7 kg] 81.7 kg (05/11 0500) Last BM Date: 11/15/2019  Intake/Output from previous day: 05/10 0701 - 05/11 0700 In: 4411.4 [I.V.:2596.4; IV Piggyback:1815] Out: 3825 [Emesis/NG output:750; Drains:30] Intake/Output this shift: Total I/O In: 105.4 [I.V.:105.4] Out: 166 [Other:166]  PE: Heart: irregular Lungs: on vent Abd: soft, but grimaces significantly with palpation to his abdomen.  Adipose tissue of midline wound is brown and mostly dry despite moist dressing changes.  Stoma is hyperemic, but actually looks a little better than yesterday.  Small area of necrosis around 4 oclock.  2 JP drains with serous output.  Lab Results:  Recent Labs    11/15/19 0347 11/15/19 1623 Dec 05, 2019 0419 Dec 05, 2019 0456  WBC 11.0*  --  15.0*  --   HGB 9.2*   < > 9.3* 14.6  HCT 27.9*   < > 28.3* 43.0  PLT 78*  --  80*  --    < > = values in this interval not displayed.   BMET Recent Labs    11/15/19 1622 11/15/19 1623 12/05/19 0419 Dec 05, 2019 0456  NA 135  135   < > 137 136  K 3.9  3.7   < > 4.1 3.7  CL 101  101  --  103  --   CO2 23  21*  --  23  --   GLUCOSE 235*  237*  --  203*  --   BUN 44*  45*  --  46*  --   CREATININE 2.45*  2.47*  --  2.24*  --   CALCIUM  8.0*  8.1*  --  7.8*  --    < > = values in this interval not displayed.   PT/INR Recent Labs    12/03/2019 0900  LABPROT 15.1  INR 1.2   CMP     Component Value Date/Time   NA 136 12-05-2019 0456   K 3.7 05-Dec-2019 0456   CL 103 12-05-19 0419   CO2 23 05-Dec-2019 0419   GLUCOSE 203 (H) 12/05/2019 0419   BUN 46 (H) 12/05/19 0419   CREATININE 2.24 (H) 05-Dec-2019 0419   CALCIUM 7.8 (L) Dec 05, 2019 0419   PROT 4.7 (L) 11/15/2019 1622   ALBUMIN 2.0 (L) 12/05/2019 0419   AST 679 (H) 11/15/2019 1622   ALT 234 (H) 11/15/2019 1622   ALKPHOS 80 11/15/2019 1622   BILITOT 1.7 (H) 11/15/2019 1622   GFRNONAA 29 (L) 12-05-19 0419   GFRAA 34 (L) Dec 05, 2019 0419   Lipase     Component Value Date/Time   LIPASE 43 03/18/2019 0849       Studies/Results: DG CHEST PORT 1 VIEW  Result Date: 11/15/2019 CLINICAL DATA:  Hypoxia EXAM: PORTABLE CHEST 1 VIEW  COMPARISON:  11/11/2019 FINDINGS: Cardiac shadow is stable. Endotracheal tube and gastric catheter are seen. Left temporary dialysis catheter is noted in the proximal superior vena cava. Right jugular sheath is again seen. Small right-sided pleural effusion is again noted. Increasing right basilar atelectasis is seen. Some left retrocardiac density is noted as well as a small left effusion somewhat greater than that noted on the prior exam. IMPRESSION: Increasing bibasilar consolidation/atelectasis. Small bilateral pleural effusions are noted right greater than left. Tubes and lines as described. Electronically Signed   By: Inez Catalina M.D.   On: 11/15/2019 17:03    Anti-infectives: Anti-infectives (From admission, onward)   Start     Dose/Rate Route Frequency Ordered Stop   11/09/19 1900  cefTRIAXone (ROCEPHIN) 2 g in sodium chloride 0.9 % 100 mL IVPB     2 g 200 mL/hr over 30 Minutes Intravenous Every 24 hours 11/09/19 1725     11/09/19 1900  metroNIDAZOLE (FLAGYL) IVPB 500 mg     500 mg 100 mL/hr over 60 Minutes Intravenous Every  8 hours 11/09/19 1725     11/09/19 0800  ceFAZolin (ANCEF) IVPB 2g/100 mL premix     2 g 200 mL/hr over 30 Minutes Intravenous Every 8 hours 11/15/2019 1545 11/09/19 1559   11/17/2019 0625  ceFAZolin (ANCEF) IVPB 2g/100 mL premix     2 g 200 mL/hr over 30 Minutes Intravenous 30 min pre-op 11/27/2019 6553 11/06/2019 0840       Assessment/Plan vascular disease, s/p aortobifemoral bypass grafting 5/3 with subsequent thrombectomy RLE with bovine patch angioplasty of right CFA with 4 compartment fasciotomies, Dr. Carlis Abbott 5/5 VDRF DM H/O HTN ARF - on CRRT Thrombocytopenia - 78K today PCM - on TNA, prealbumin 5.7 Shock liver - patchy areas of ischemia on liver  POD 6, 4, 2, s/p left colectomy with discontinuity and Abthera VAC placement, Dr. Donne Hazel 5/5, SBR and TC colostomy creation on 5/7, MW, SBR, cholecystectomy with patchy ischemia of liver and abdominal closure on 5/9, CW -WBC up to 15K -lactic acid elevated, ammonia elevated -patient overall appears to be worsening.  Abdomen is stable currently with no output in his colostomy. -discussions are ongoing between aggressive care and comfort care with his two sons.  I think they are leaning towards comfort care, which I think is very reasonable as the patient is not showing any significant improvements for recovery. -cont to follow -no further intra-abdominal surgery at this point  FEN -NPO/NGT/TNA VTE -heparin ID -rocephin/flagyl   LOS: 8 days    Henreitta Cea , [facility] 12/10/19, 8:35 AM Please see Amion for pager number during day hours 7:00am-4:30pm or 7:00am -11:30am on weekends

## 2019-12-07 NOTE — Procedures (Signed)
Extubation Procedure Note  Patient Details:   Name: [name] DOB: 10-08-1950 MRN: [id number]   Airway Documentation:    Vent end date: 2019/11/25 Vent end time: 1056   Evaluation  O2 sats: stable throughout Complications: No apparent complications Patient did tolerate procedure well. Bilateral Breath Sounds: Rhonchi   No   Patient was terminally extubated to room air per MD order with RN at the bedside.   Claretta Fraise 2019/11/25, 10:56 AM

## 2019-12-07 NOTE — Progress Notes (Signed)
PULMONARY / CRITICAL CARE MEDICINE   Name: Juan Bass MRN: [id number] DOB: [birth year]    ADMISSION DATE:  11/19/2019 CONSULTATION DATE: 11/13/2019  REFERRING MD:  Dr. Monica Martinez   CHIEF COMPLAINT:  Post-Op hyperkalemia, hypoxia. resp distres  LOS: 8   HISTORY OF PRESENT ILLNESS:   69 year old man with end-stage renal disease on hemodialysis who underwent an uneventful aorto bifemoral bypass 5/3. Hypotensive following surgery. Also pt had increasing hypoxia and respiratory distress post op day 1 followed by brief cardiac arrest. Required intubation and initiation of vasopressors after shock.  CT chest r/o PE and aortic dissection. Revealed left colon and large bowel dilation. Upon re-evaluation the patient was unable to re-estab flow to right leg. Dopplers were negative for pulse and patient was rush for lap, emergency left colectomy and fasciotomy times 4  Post op day 3 (5/6) pt had intermittent SVT progress to a-fib. Was given amiodarone to abort and treat. Patient remains on 2 pressors and scheduled for OR 5/7 closure RLE lateral fasciotomy and partial closure with VAC placement on medial RLE fasciotomy and Small bowel resection, additional rectum resection, end transverse colostomy  Since return post op 5/7 pt has had intermittent bouts of Afib w hypotentions. Requiring pressors for maint of MAP.  While on service pt has lost ability to respond to commands, unable to vocalize or breathe on his own. Weaning attempts of MV, pain and pressors have all failed  PAST MEDICAL HISTORY :  He  has a past medical history of AAA (abdominal aortic aneurysm) ([place]), Chronic kidney disease, Diabetes mellitus without complication ([place]), Hypertension, Hypothyroidism, and PAD (peripheral artery disease) ([place]).  PAST SURGICAL HISTORY: He  has a past surgical history that includes Back surgery; abdominal aortagram (N/A, 02/22/2014); Spine surgery; AV fistula placement (Left, 08/06/2018);  A/V Fistulagram (Left, 10/14/2018); PERIPHERAL VASCULAR BALLOON ANGIOPLASTY (Left, 10/14/2018); Abdominal aortic aneurysm repair (N/A, 11/15/2019); Thrombectomy of bypass graft femoral- popliteal artery (Right, 12/01/2019); Abdominal aortic aneurysm repair (N/A, 11/13/2019); Patch angioplasty (Right, 11/28/2019); Colon resection sigmoid (N/A, 12/05/2019); Application if wound vac (N/A, 11/28/2019); Fasciotomy (Right, 11/17/2019); laparotomy (N/A, 11/30/2019); Colostomy (N/A, 12/02/2019); Bowel resection (N/A, 11/28/2019); Application if wound vac (N/A, 11/21/2019); I & D extremity (Right, 11/19/2019); Fasciotomy closure (Right, 11/11/2019); Application if wound vac (Right, 11/17/2019); laparotomy (N/A, 11/07/2019); Bowel resection (N/A, 11/06/2019); Omentectomy (N/A, 11/27/2019); and Cholecystectomy (N/A, 11/13/2019).  No Known Allergies  No current facility-administered medications on file prior to encounter.   Current Outpatient Medications on File Prior to Encounter  Medication Sig  . aspirin EC 81 MG tablet Take 1 tablet (81 mg total) by mouth daily with breakfast.  . Cholecalciferol (VITAMIN D3) 50 MCG (2000 UT) TABS Take 2,000 Units by mouth 4 (four) times a week.  . labetalol (NORMODYNE) 200 MG tablet Take 2 tablets (400 mg total) by mouth 2 (two) times daily. (Patient taking differently: Take 200 mg by mouth 2 (two) times daily. )  . levothyroxine (SYNTHROID) 50 MCG tablet Take 1 tablet (50 mcg total) by mouth daily before breakfast.  . acetaminophen (TYLENOL) 325 MG tablet Take 2 tablets (650 mg total) by mouth every 6 (six) hours as needed for mild pain (or Fever >/= 101).    FAMILY HISTORY:  His He indicated that his mother is deceased. He indicated that his father is deceased. He indicated that his sister is alive. He indicated that his brother is alive.   SOCIAL HISTORY: He  reports that he has been smoking cigarettes. He has a 20.00 pack-year  smoking history. He has never used smokeless tobacco. He reports current alcohol  use of about 1.0 standard drinks of alcohol per week. He reports that he does not use drugs.  REVIEW OF SYSTEMS:   Full Review of Systems negative except otherwise specified as above   VITAL SIGNS: BP (!) 95/54   Pulse 76   Temp (!) 96.1 F (35.6 C)   Resp 14   Ht 5\' 9"  (1.753 m)   Wt 81.7 kg   SpO2 100%   BMI 26.60 kg/m   HEMODYNAMICS: CVP:  [0 mmHg-11 mmHg] 11 mmHg  VENTILATOR SETTINGS: Vent Mode: PCV FiO2 (%):  [50 %-100 %] 100 % Set Rate:  [18 bmp-22 bmp] 18 bmp Vt Set:  [560 mL] 560 mL PEEP:  [5 cmH20-12 cmH20] 12 cmH20 Pressure Support:  [10 cmH20-18 cmH20] 18 cmH20 Plateau Pressure:  [24 cmH20-31 cmH20] 28 cmH20  INTAKE / OUTPUT: I/O last 3 completed shifts: In: 6041.1 [I.V.:3880.3; IV Piggyback:2160.9] Out: 5549 [Emesis/NG output:800; Drains:140; EQAST:4196]  PHYSICAL EXAMINATION: Physical Exam Constitutional:      General: He is sleeping.     Appearance: He is ill-appearing.     Interventions: He is sedated and intubated.  HENT:     Head: Atraumatic.     Jaw: No swelling.     Mouth/Throat:     Lips: Lesions present.     Mouth: Mucous membranes are dry.     Palate: No mass.     Comments: Lower lip lesion Eyes:     General: No scleral icterus. Neck:     Thyroid: No thyroid mass or thyromegaly.     Trachea: No tracheal deviation.  Cardiovascular:     Rate and Rhythm: Tachycardia present. Rhythm irregularly irregular.     Pulses: No decreased pulses.          Dorsalis pedis pulses are detected w/ Doppler on the right side and 1+ on the left side.       Posterior tibial pulses are detected w/ Doppler on the right side and 1+ on the left side.     Heart sounds: Heart sounds are distant. No friction rub. No gallop.   Pulmonary:     Effort: He is intubated.     Breath sounds: No stridor.  Chest:     Chest wall: No deformity.  Abdominal:     General: A surgical scar is present. The ostomy site is clean.     Palpations: Abdomen is soft.     Comments:  Colostomy bag present  Musculoskeletal:     Right lower leg: Edema present.     Left lower leg: Edema present.  Feet:     Right foot:     Skin integrity: Blister and skin breakdown present.     Left foot:     Skin integrity: Dry skin present.  Skin:    Coloration: Skin is mottled.     Comments: Mottled lower extremities bilaterally R>L  Neurological:     Mental Status: He is unresponsive.     LABS:  BMET Recent Labs  Lab 11/15/19 0347 11/15/19 0347 11/15/19 1622 11/15/19 1623 11/15/19 1949 11/25/2019 0419 2019-11-25 0456  NA 137  137   < > 135  135   < > 136 137 136  K 3.2*  3.3*   < > 3.9  3.7   < > 3.8 4.1 3.7  CL 103  103  --  101  101  --   --  103  --  CO2 23  22  --  23  21*  --   --  23  --   BUN 39*  41*  --  44*  45*  --   --  46*  --   CREATININE 2.55*  2.52*  --  2.45*  2.47*  --   --  2.24*  --   GLUCOSE 192*  189*  --  235*  237*  --   --  203*  --    < > = values in this interval not displayed.    Electrolytes Recent Labs  Lab 12/02/2019 0350 11/17/2019 1552 11/15/19 0347 11/15/19 1622 Dec 13, 2019 0419  CALCIUM 8.2*   < > 7.9*  7.9* 8.0*  8.1* 7.8*  MG 2.5*  --  2.3  --  2.0  PHOS 1.5*   < > 1.4*  1.4* 2.1* 2.3*   < > = values in this interval not displayed.    CBC Recent Labs  Lab 11/27/2019 0350 12/06/2019 0947 11/15/19 0347 11/15/19 1623 11/15/19 1949 December 13, 2019 0419 Dec 13, 2019 0456  WBC 7.8  --  11.0*  --   --  15.0*  --   HGB 9.5*   < > 9.2*   < > 9.2* 9.3* 14.6  HCT 29.9*   < > 27.9*   < > 27.0* 28.3* 43.0  PLT 43*  --  78*  --   --  80*  --    < > = values in this interval not displayed.    Coag's Recent Labs  Lab 11/11/2019 1240 11/11/19 0221 11/11/2019 0900  APTT  --   --  42*  INR 2.1* 1.7* 1.2    Sepsis Markers Recent Labs  Lab 11/11/19 0221 11/11/19 1652 11/15/19 1622  LATICACIDVEN 3.9* 2.7* 2.7*    ABG Recent Labs  Lab 11/15/19 1754 11/15/19 1949 13-Dec-2019 0456  PHART 7.369 7.388 7.331*  PCO2ART 43.5  39.3 47.8  PO2ART 55* 60* 57*    Liver Enzymes Recent Labs  Lab 11/13/19 0932 11/13/19 1540 11/15/19 0347 11/15/19 1622 13-Dec-2019 0419  AST 1,659*  --  719* 679*  --   ALT 523*  --  259* 234*  --   ALKPHOS 75  --  71 80  --   BILITOT 1.8*  --  1.8* 1.7*  --   ALBUMIN 2.6*   < > 2.1*  2.1* 2.1*  2.2* 2.0*   < > = values in this interval not displayed.    Cardiac Enzymes No results for input(s): TROPONINI, PROBNP in the last 168 hours.  Glucose Recent Labs  Lab 11/15/19 1142 11/15/19 1524 11/15/19 2002 12-13-19 0037 13-Dec-2019 0456 Dec 13, 2019 0832  GLUCAP 237* 222* 200* 182* 190* 207*    Imaging  11/09/2019 chest x-ray with decreased excursion with bilateral atelectasis  CTA C/A/P 5/4 IMPRESSION: 1. Negative for acute PE or thoracic aortic dissection. 2. Interval infrarenal aortobifem graft repair, patent. 3. 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 4. Small amount of pelvic and abdominal ascites. 5. Small left and trace right pleural effusions. 6. Right middle and lower lobe consolidation/atelectasis with probable occlusive central bronchial secretions. 7. Coronary artery disease.   ANTIBIOTICS: Anti-infectives (From admission, onward)   Start     Dose/Rate Route Frequency Ordered Stop   11/09/19 1900  cefTRIAXone (ROCEPHIN) 2 g in sodium chloride 0.9 % 100 mL IVPB     2 g 200 mL/hr over 30 Minutes Intravenous Every 24 hours 11/09/19 1725  11/09/19 1900  metroNIDAZOLE (FLAGYL) IVPB 500 mg     500 mg 100 mL/hr over 60 Minutes Intravenous Every 8 hours 11/09/19 1725     11/09/19 0800  ceFAZolin (ANCEF) IVPB 2g/100 mL premix     2 g 200 mL/hr over 30 Minutes Intravenous Every 8 hours 11/06/2019 1545 11/09/19 1559   11/24/2019 0625  ceFAZolin (ANCEF) IVPB 2g/100 mL premix     2 g 200 mL/hr over 30 Minutes Intravenous 30 min pre-op 11/07/2019 4166 12/06/2019 0840      Current Facility-Administered  Medications (Endocrine & Metabolic):  .  insulin aspart (novoLOG) injection 0-20 Units .  levothyroxine (SYNTHROID, LEVOTHROID) injection 25 mcg .  vasopressin (PITRESSIN) 40 Units in sodium chloride 0.9 % 250 mL (0.16 Units/mL) infusion   Current Facility-Administered Medications (Cardiovascular):  .  [EXPIRED] amiodarone (NEXTERONE PREMIX) 360-4.14 MG/200ML-% (1.8 mg/mL) IV infusion **IN FOLLOWED-BY LINKED GROUP WITH** amiodarone (NEXTERONE PREMIX) 360-4.14 MG/200ML-% (1.8 mg/mL) IV infusion   .  norepinephrine (LEVOPHED) 16 mg in 246mL premix infusion   Current Facility-Administered Medications (Analgesics):  .  acetaminophen (TYLENOL) tablet 325-650 mg **OR**  .  fentaNYL (SUBLIMAZE) bolus via infusion 25 mcg .  fentaNYL (SUBLIMAZE) injection 25 mcg .  fentaNYL (SUBLIMAZE) injection 25-100 mcg .  fentaNYL 2566mcg in NS 258mL (68mcg/ml) infusion-PREMIX .  lidocaine (PF) (XYLOCAINE) 1 % injection 5 mL   Current Facility-Administered Medications (Hematological):  .  alteplase (CATHFLO ACTIVASE) injection 2 mg .  heparin injection 1,000-6,000 Units .  heparinized saline (2000 units/L) primer fluid for CRRT   Current Facility-Administered Medications (Other):  .   prismasol BGK 4/2.5 infusion .  0.9 %  sodium chloride infusion (Manually program via Guardrails IV Fluids) .  0.9 %  sodium chloride infusion .  cefTRIAXone (ROCEPHIN) 2 g in sodium chloride 0.9 % 100 mL IVPB .  chlorhexidine (PERIDEX) 0.12 % solution 15 mL .  Chlorhexidine Gluconate Cloth 2 % PADS 6 each .  dexmedetomidine (PRECEDEX) 400 MCG/100ML (4 mcg/mL) infusion .  lidocaine-prilocaine (EMLA) cream 1 application .  MEDLINE mouth rinse .  metroNIDAZOLE (FLAGYL) IVPB 500 mg .  midazolam (VERSED) injection 1 mg .  ondansetron (ZOFRAN) injection 4 mg .  pantoprazole (PROTONIX) injection 40 mg .  pentafluoroprop-tetrafluoroeth (GEBAUERS) aerosol 1 application .  phenol (CHLORASEPTIC) mouth spray 1 spray .   potassium PHOSPHATE 20 mmol in dextrose 5 % 500 mL infusion .  prismasol BGK 4/2.5 infusion .  sodium bicarbonate injection 50 mEq .  TPN ADULT (ION)    SIGNIFICANT EVENTS: 11/09/2019 found to be hypoxic progressed to cardiac arrest 11/09/2019 pulmonary critical care for hypoxia 11/09/2019 Critical level hyperkalemia >7 admin calcium gluconate 11/20/2019 Required 1unit plts trans intraoperatively   Procedure(s) Performed: 5/3THROMBECTOMY OF BYPASS GRAFT OF LEG (Right Groin) 5/4 EXPLORATORY LAPAROTOMY (N/A Abdomen) Patch Angioplasty (Right Groin) Colon Resection Sigmoid (N/A Abdomen) Application Of Wound Vac (N/A Abdomen) Fasciotomy (Right Leg Lower) 5/7 Colostomy, RLE lateral fascial closure, removal of necrotic bowel  5/8 Intermittent Afib w hypotension  5/9 Reopening recent laparotomy SB resection w/ anastomosis  - Cholecystectomy  - Partial omentectomy   LINES/TUBES: Line  Post Cath / Sheath Left Arterial -- days  Fistula / Graft Left Upper arm Arteriovenous fistula 466 days  Hemodialysis Catheter Left Internal jugular Triple lumen Temporary (Non-Tunneled) 5 days  Drain  NG/OG Tube Nasogastric 16 Fr. Left nare Documented cm marking at nare/ corner of mouth 7 days  Negative Pressure Wound Therapy Leg Right 3 days  Closed  System Drain 1 Right Abdomen Accordion (Hemovac) 19 Fr. 2 days  Colostomy LLQ 2 days  Closed System Drain 2 Right;Medial RUQ Bulb (JP) 19 Fr. less than 1 day  Airway  Airway 5 days  Wound  Incision (Closed) 11/29/2019 Groin Left 6 days  Incision (Closed) 11/09/2019 Groin Right 4 days  Wound / Incision (Open or Dehisced) 11/20/2019 Incision - Open Leg Right Fasciotomy 3 days  Incision (Closed) 12/05/2019 Abdomen Other (Comment) 1 day  CVC Line  CVC Double Lumen 11/26/2019 Right Internal jugular 6 days  PIV Line  Peripheral IV 11/09/2019 Right Hand 7 days  Peripheral IV 11/09/19 Anterior;Distal;Right;Upper Arm 5 days  ART Line  Introducer Single Lumen 11/09/2019  Internal Jugular Right 6 days  Arterial Line 11/06/2019 Right Radial 2 days     DISCUSSION: 69 year old man with end-stage renal disease on hemodialysis who underwent an uneventful aorto bifemoral bypass 5/3. Postop day 8 s/p open repair of juxtarenal abdominal aortic aneurysm. Patient had done very poorly postoperatively and has required multiple procedures following. He is currently intubated on pressors with multisystem organ failure, encephalopathic and not tolerate of weaning off vasopressors.    ASSESSMENT / PLAN: Pt has been able to wean of pressors, however, requires extensive MV and pain management.  Attempts have resulted in SVT/Afib w svr range HTN. He is unable to vent. ABG reveals pO2 40 on 50% FIO2. FIO2 increased to 70 Yesterday afternoon pt had worsening sats and increased WOB. CXR demonstrated RLL collapse. Required therapeutic bronch suctioning to remove mucus plugging noted in bronchus intermdius and sunction of RML and RLL. Pt shows evidence of chronic bronchitis likely due to extensive smoking history. CXR 5/11 show large RIGHT pleural effusion and increasing bibasilar consolidation/atelectasis. No pneumothorax  On examination: Patient is sedated and intubated. There are scattered rales and intermittent vent dyssynchrony. Heart sounds are distant. Extremities mottled, there is stable bullous lesions on the dorsal surface of right foot. Pedal pulses were confirmed on doppler but were barely palpable. BLE continues to show signs of ischemic change and vasc surg recs necessity of bilateral AKA. RLE anterior chamber muscle appears edematous and dusky.  Recent small bowel resection with anastomosis and stoma performed, pt also required cholecystectomy and partial omentectomy likely secondary to ischemic damage over SMA perfused organ areas. Given the patients assessment and phys exam, pt remains critically ill and not showing signs of progression as pt continues to require ventilation w  dyssynchrony, likely would not tolerate SBT. Pt is also unable to wean off vasopressors. Pt is likely going for follow up operation goal is to support in interim Palliative care is following case.   Atrial fibrillation with RVR/CVR and hypotension- started on amiodarone per Cardiology. -Cont amiodarone for rate control  -Persistent tachycardia >130 consider IV digoxin  Shock with multisystem organ failure following AAA repair-   Respiratory failure in setting of ischemic bowel and ischemic leg- Ongoing resection of ischemic small bowel, cholecystectomy, partial omentectomy, and some patches of ischemic changes to liver. Demonstrates worsening prognosis as extent of ischemic damage is still manifesting. Encephalopathic state cont. Pt is on vent stable SpO2. Previous attempts to wean from pain control failed. - Ordered serum ammonia  - Continue full support today - Cont vasopressin 40 Units in sodium chloride 0.9 % 250 mL infusion - Cont norepinephrine (LEVOPHED) 16 mg in 231mL premix infusion - MAP goal maintain above 65 - AM CXR - Maintain current pain control. Reassess post op  Acute on chronic renal insufficiency-ESRD- Encephalopathy -  CK remains elevated post AKI on top of CRD - Potassium normalized - Electrolyte repletion assisted by TPN - Continue CRRT  - CTM Mg and Phosphorus - Renal fnxn panel schedule  S/P Colectomy- Pt will require further surgery-More ischemic changes noted. Abd wound showing necrotic/grangrenous skin changes through subcutaneous adipose layers, no drainage noted. Diminished GI function assessment for nutritional status is concern. Current TPN provides 150g AA, 229g CHO and 58 gm lipid for a total of 1900 kCal, meeting ~100% of patient needs Electrolytes in TPN: standard Na, increase Ca 12, increase Phos 8, Cl:Ac 1:2 - CTM JP drain output/character - TPN Pharmacy recs appreciated w replacement of electrolyte insuf - Cont TPN rate to goal of 70 ml/hr  - NPO,  maintain OG tube  S/P cholecystectomy- drain in place w mild serosang drainage, -Surgical team to follow -CTM  RLE ischemia post fasciotomy.  S/P open AAA repair and aortobifemoral bypass-  -Vascular currently following -CTM RLE pulse  Hypothyroid- continue synthroid  Acute blood loss anemia/thrombocytopenia- Acute blood loss anemia continues to improve.  Thrombocytopenia also slowly improving.  Worsening leukocytosis could be due to ischemic extremities or possible further ischemic bowel.  No plans per general surgery to return to OR.   Pt on ceftriaxone and flagyl.  - No plans per gen surg for further operations -CTM CBC  -Coag studies  Family meeting held. Decision to be made regarding further care and management to comfort care and withdrawal of support  [facility]  Nov 26, 2019, 11:01 AM

## 2019-12-07 NOTE — Progress Notes (Signed)
Inpatient Diabetes Program Recommendations  AACE/ADA: New Consensus Statement on Inpatient Glycemic Control   Target Ranges:  Prepandial:   less than 140 mg/dL      Peak postprandial:   less than 180 mg/dL (1-2 hours)      Critically ill patients:  140 - 180 mg/dL   Results for Juan Bass, Juan Bass (MRN [id number]) as of 12-03-19 10:15  Ref. Range 11/15/2019 07:46 11/15/2019 11:42 11/15/2019 15:24 11/15/2019 20:02 2019-12-03 00:37 12-03-19 04:56 12-03-2019 08:32  Glucose-Capillary Latest Ref Range: 70 - 99 mg/dL 216 (H) 237 (H) 222 (H) 200 (H) 182 (H) 190 (H) 207 (H)   Review of Glycemic Control  Current orders for Inpatient glycemic control: Novolog 0-20 units Q4H; TPN @ 70 ml/hr  Inpatient Diabetes Program Recommendations:    Insulin: Patient received a total of Novolog 33 units for correction on 11/15/19 and glucose ranged from 162-237 mg/dl on 11/15/19. Recommend adding Regular 35 insulin to TPN to improve glycemic control.  Thanks, Barnie Alderman, RN, MSN, CDE Diabetes Coordinator Inpatient Diabetes Program [phone] (Team Pager from 8am to 5pm)

## 2019-12-07 NOTE — Progress Notes (Addendum)
Critical care discussed with pt comfort care and family is agreeable to this.  Will proceed on this pathway.  Tried to talk to family but called for a patient care issue.  I will call them today.  Ruta Hinds, MD [facility] [place] Office: [phone]  Addendum: spoke with son by phone confirmed comfort care measures.  Ruta Hinds, MD [facility] [place] Office: [phone]

## 2019-12-07 NOTE — Progress Notes (Signed)
AAA Progress Note    12/06/2019 7:15 AM 2 Days Post-Op  Subjective:  Intubated; opened eyes; squeezed with right hand  29'U-765'Y systolic HR 65'K-35'W NSR/afib 98% 100% FiO2  Gtts:   Amiodarone Fentanyl  TPN  Vitals:   Dec 06, 2019 0500 2019-12-06 0600  BP: 97/64 (!) 86/56  Pulse: 84 82  Resp: 15 15  Temp: (!) 93.9 F (34.4 C) (!) 93.9 F (34.4 C)  SpO2: 97% 92%    Physical Exam: Cardiac:  regular Lungs:  Intubated on 100% FiO2 (increased earlier this am) Abdomen:  Colostomy in place; unchanged from yesterday Incisions:  Bilateral groins healing nicely.  Midline with bandage that is clean and dry. Extremities:  Ischemic BLE that is essentially unchanged; +doppler signals right peroneal/PT and left DP/PT/peroneal   CBC    Component Value Date/Time   WBC 15.0 (H) 12/06/19 0419   RBC 3.27 (L) Dec 06, 2019 0419   HGB 14.6 2019/12/06 0456   HCT 43.0 2019-12-06 0456   PLT 80 (L) 2019-12-06 0419   MCV 86.5 12-06-19 0419   MCH 28.4 12-06-19 0419   MCHC 32.9 06-Dec-2019 0419   RDW 19.0 (H) 2019-12-06 0419   LYMPHSABS 1.1 11/15/2019 0347   MONOABS 0.6 11/15/2019 0347   EOSABS 0.2 11/15/2019 0347   BASOSABS 0.0 11/15/2019 0347    BMET    Component Value Date/Time   NA 136 2019-12-06 0456   K 3.7 2019/12/06 0456   CL 103 12-06-2019 0419   CO2 23 Dec 06, 2019 0419   GLUCOSE 203 (H) Dec 06, 2019 0419   BUN 46 (H) 12-06-19 0419   CREATININE 2.24 (H) 12/06/19 0419   CALCIUM 7.8 (L) 12-06-19 0419   GFRNONAA 29 (L) 2019-12-06 0419   GFRAA 34 (L) Dec 06, 2019 0419    INR    Component Value Date/Time   INR 1.2 11/25/2019 0900     Intake/Output Summary (Last 24 hours) at 12/06/2019 0715 Last data filed at Dec 06, 2019 0700 Gross per 24 hour  Intake 4411.35 ml  Output 3987 ml  Net 424.35 ml     Assessment/Plan:  69 y.o. male is s/p  Aortobifemoral bypass grafting 8Days Post-Op  And Left colectomy/vac placement (general surgery) and thrombectomy RLE  with bovine patch angioplasty of right CFA with 4 compartment fasciotomies 6 Day Post-Op And 1. Reopening of recent laparotomy 2. Small bowel resection with anastomosis 3. Cholecystectomy 4. Partial omentectomy 5. Abdominal washout and closure 2 Days Post-Op   -Vascular:  BLE continues to be ischemic and most likely not viable and will need bilateral AKA.   -Cardiac:  afib to NSR; continues on amiodarone.  Soft BP and required a fluid bolus this am.  Not on any pressor support at this time.  -Pulmonary:  Decreased pO2 yesterday and was had bronchoscopy at bedside and was found to have a few mucous plugs per RN.  Pt back up to 100% FiO2.  -Neuro:  Opened eyes this am and squeezed his right hand for me. -Renal:  ESRD on CRRT -GI:  Recent small bowel resection with anastomosis and stoma.  Pt on TPN for nutrition.  -Incisions:  Bilateral groin incisions are healing nicely.  Midline incision bandage clean and dry  -Heme/ID:  Acute blood loss anemia continues to improve.  Thrombocytopenia also slowly improving.  Worsening leukocytosis could be due to ischemic extremities or possible further ischemic bowel.  No plans per general surgery to return to OR.   Pt on ceftriaxone and flagyl.    Dr. Oneida Alar discussed with pt's family  that this is most likely not a survivable event.  He will need bilateral AKA if they decide to continue current care.  His family wanted to talk it over with the rest of family last evening whether to continue care or pursue comfort measures.     Leontine Locket, PA-C [facility] [phone] 11/28/19 7:15 AM

## 2019-12-07 DEATH — deceased

## 2020-01-25 ENCOUNTER — Ambulatory Visit: Payer: PPO

## 2020-06-26 ENCOUNTER — Ambulatory Visit: Payer: PPO

## 2020-06-26 ENCOUNTER — Encounter (HOSPITAL_COMMUNITY): Payer: PPO

## 2020-06-26 ENCOUNTER — Encounter (HOSPITAL_COMMUNITY): Payer: BLUE CROSS/BLUE SHIELD

## 2020-06-26 ENCOUNTER — Ambulatory Visit: Payer: Self-pay | Admitting: Family

## 2020-06-27 ENCOUNTER — Encounter (HOSPITAL_COMMUNITY): Payer: PPO

## 2020-06-27 ENCOUNTER — Ambulatory Visit: Payer: Self-pay | Admitting: Family

## 2020-06-27 ENCOUNTER — Ambulatory Visit: Payer: PPO

## 2020-06-27 ENCOUNTER — Encounter (HOSPITAL_COMMUNITY): Payer: BLUE CROSS/BLUE SHIELD

## 2020-12-11 IMAGING — CT CT ANGIO CHEST-ABD-PELV FOR DISSECTION W/ AND WO/W CM
2 of 7 series · 13 of 46 positions shown, 15 images · IV contrast (OMNI 350)
Comparison: 09/16/2019

:
Alerts
CLINICAL DATA: Cardiac arrest. Recent aortobifem bypass.

EXAM:
CT ANGIOGRAPHY CHEST, ABDOMEN AND PELVIS
TECHNIQUE: Multidetector CT imaging through the chest, abdomen and pelvis was
performed using the standard protocol during bolus administration of
intravenous contrast. Multiplanar reconstructed images and MIPs were
obtained and reviewed to evaluate the vascular anatomy.
CONTRAST:  100mL OMNIPAQUE IOHEXOL 350 MG/ML SOLN

[Series 7: dissection 2mm · axial · 0.86mm/px · z∈[+978,+1556]mm · 10 of 323 slices shown, 12 images]
[im 17/323  soft-tissue]
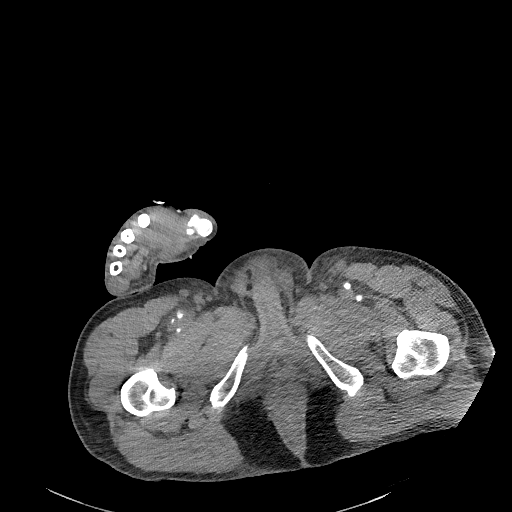
[im 17/323  bone]
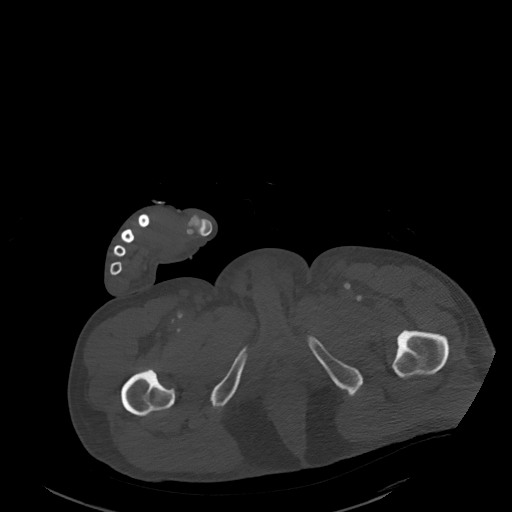
[im 49/323  soft-tissue]
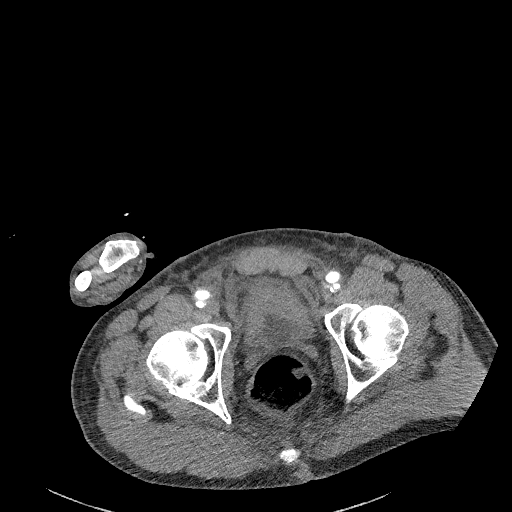
[im 81/323  soft-tissue]
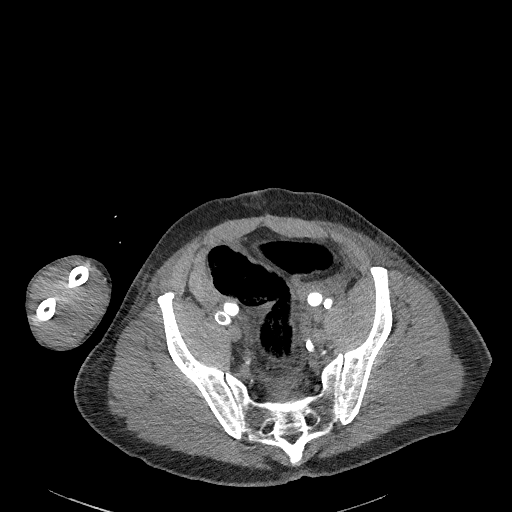
[im 113/323  soft-tissue]
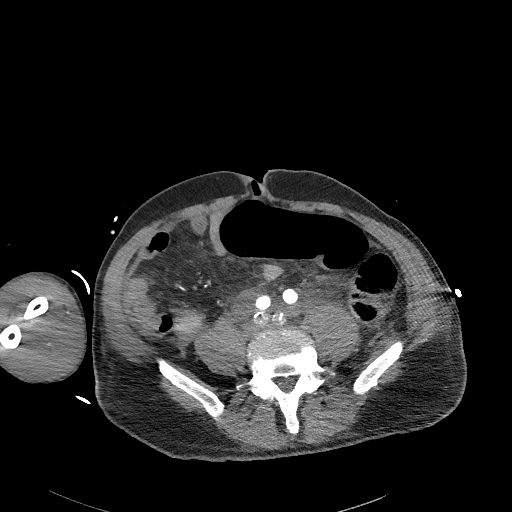
[im 145/323  soft-tissue]
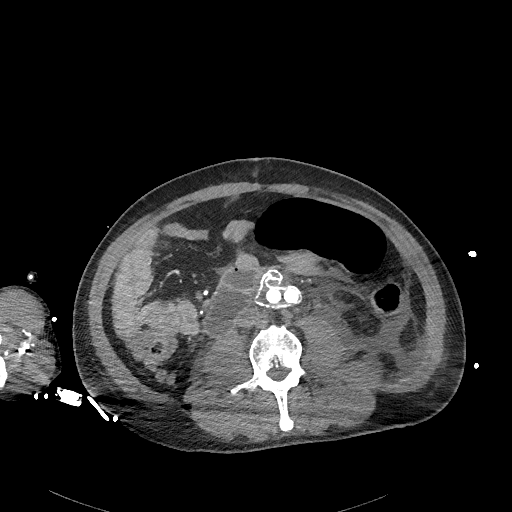
[im 178/323  soft-tissue]
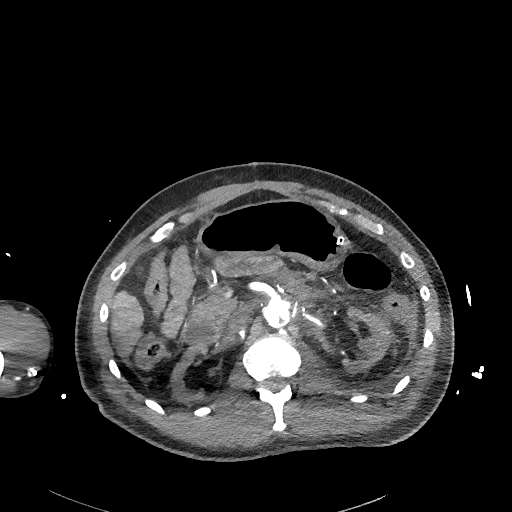
[im 210/323  soft-tissue]
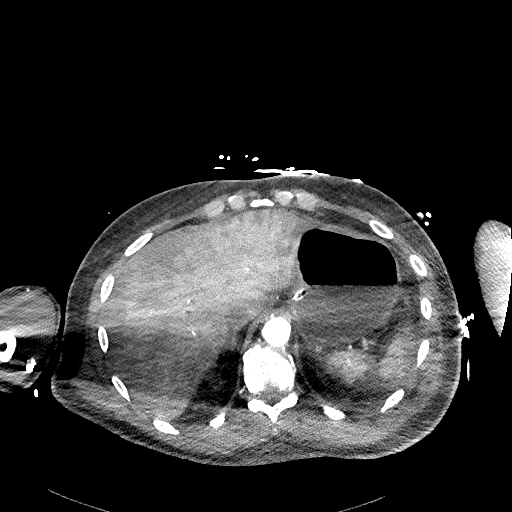
[im 242/323  soft-tissue]
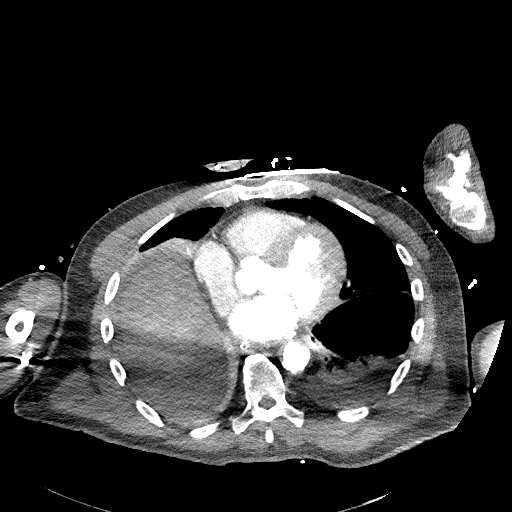
[im 274/323  soft-tissue]
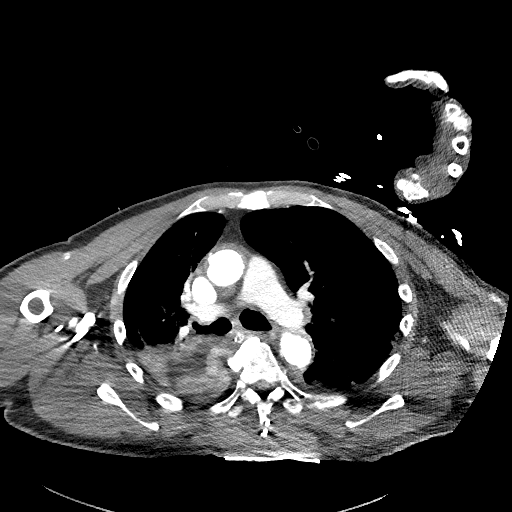
[im 274/323  bone]
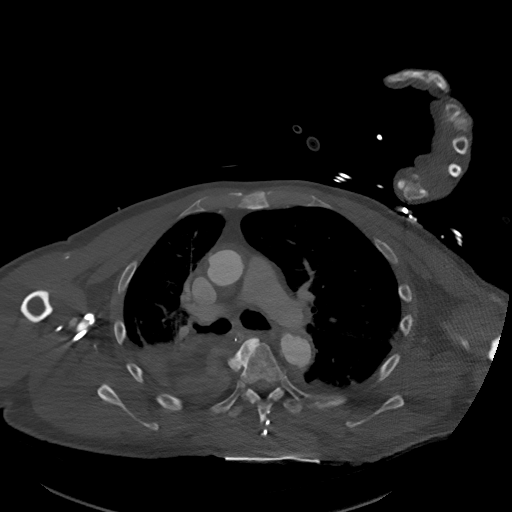
[im 306/323  soft-tissue]
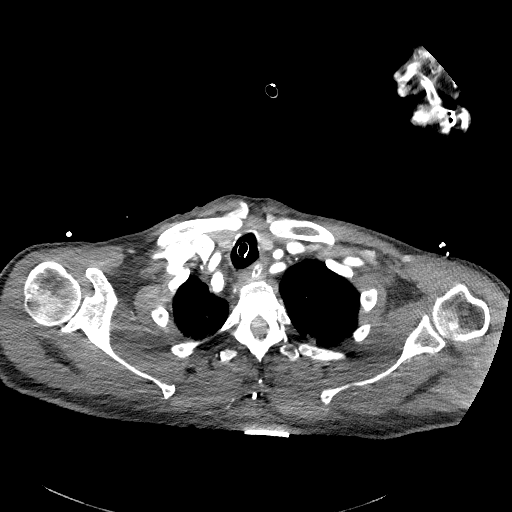

[Series 10: dissection 2mm cor · coronal · 0.81mm/px · 3 of 171 slices shown]
[im 43/171  soft-tissue]
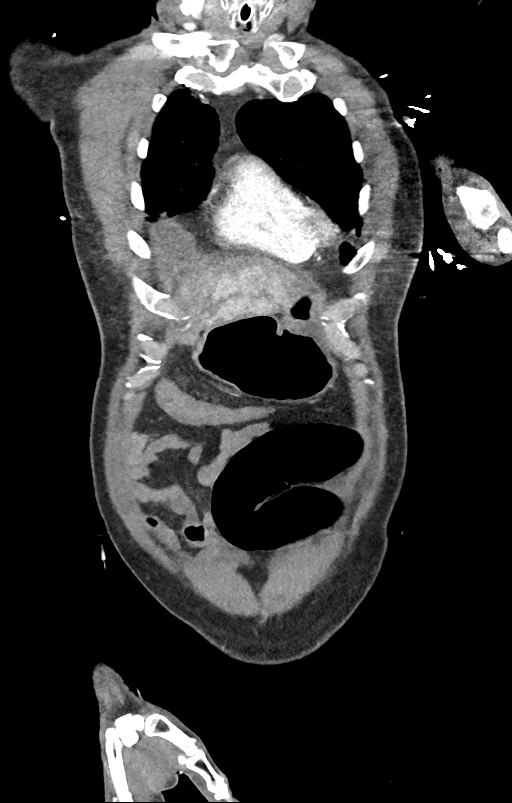
[im 86/171  soft-tissue]
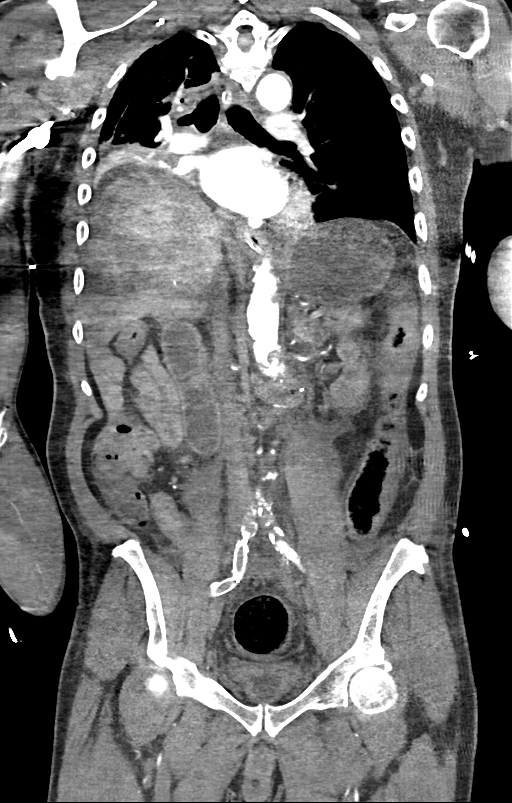
[im 128/171  soft-tissue]
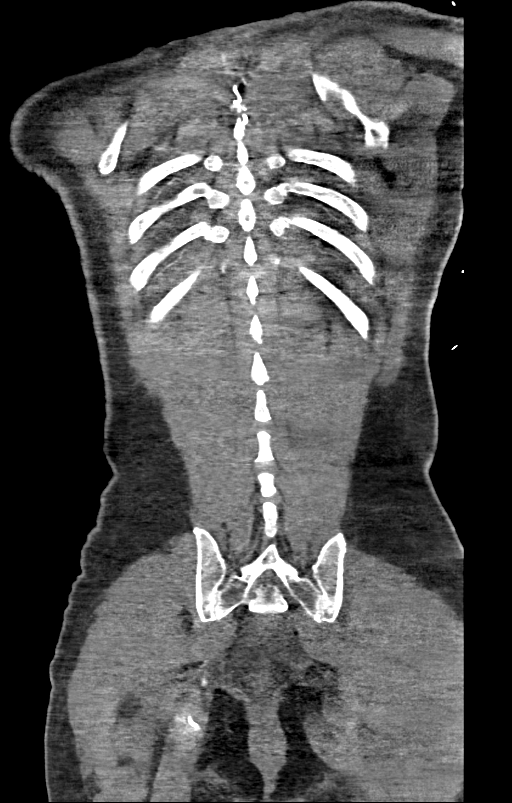

[13 of 46 positions shown; findings below may reference images not displayed]

FINDINGS: CTA CHEST FINDINGS

Cardiovascular: Right IJ central venous catheter extends to the mid
SVC. Heart size normal. Fair contrast opacification of pulmonary
arterial branches. No convincing filling defects to suggest PE. Mild
calcifications in the LAD. Extensive atheromatous plaque through the
thoracic aorta. Peripherally calcified ductus diverticulum.
Eccentric nonocclusive mural thrombus throughout the descending
segment. No aneurysm, dissection or stenosis. Classic 3 vessel
brachiocephalic arterial origin anatomy without proximal stenosis.

Mediastinum/Nodes: No hilar or mediastinal adenopathy. Endotracheal
tube in good position.

Lungs/Pleura: Small left pleural effusion and trace right effusion.
No pneumothorax. Consolidation/atelectasis of the right middle and
lower lobes with probable occlusive central bronchial secretions.
Dependent atelectasis in the posterior right upper lobe and
posterior left lower lobe.

Musculoskeletal: Anterior vertebral endplate spurring at multiple
levels in the mid and lower thoracic spine. No fracture or worrisome
bone lesion.

Review of the MIP images confirms the above findings.

CTA ABDOMEN AND PELVIS FINDINGS

VASCULAR

Aorta: Eccentric nonocclusive mural thrombus in the native
suprarenal and juxtarenal segments. Interval infrarenal aortobifem
graft repair, patent. Coarse calcifications in the collapsed native
aneurysm sac.

Celiac: Patent without evidence of aneurysm, dissection, vasculitis
or significant stenosis.

SMA: Mild nonocclusive plaque. Classic distal branch anatomy.

Renals: Duplicated left, inferior dominant with short-segment ostial
stenosis.

Single right, with partially calcified proximal plaque, no
high-grade stenosis.

IMA: Not opacified.

Inflow: Both limbs of the aortobifem graft widely patent through the
distal anastomoses. Interval long segment occlusion of right common
iliac artery extending into internal and external iliac arteries,
with distal reconstitution. Left common iliac is now occluded
through its length, with collateral reconstitution of atheromatous
nondilated external and internal iliac arteries.

Patent but atheromatous proximal visualized lower extremity arterial
outflow.

Veins: No obvious venous abnormality within the limitations of this
arterial phase study.

Review of the MIP images confirms the above findings.

NON-VASCULAR

Hepatobiliary: Heterogenous parenchymal enhancement presumably
related to early arterial phase timing of the scan, with limited
portal vein enhancement. Gallbladder unremarkable.

Pancreas: Unremarkable. No pancreatic ductal dilatation or
surrounding inflammatory changes.

Spleen: Heterogenous enhancement presumably related to early scan
timing.

Adrenals/Urinary Tract: Bilateral adrenal hypertrophy. No renal mass
or hydronephrosis. Foley catheter decompresses urinary bladder.

Stomach/Bowel: Orogastric tube extends into the stomach which is
partially distended by gas and fluid. Fluid distention of the
duodenum. Small bowel is decompressed. Proximal colon is
nondistended with a few scattered diverticula. Sigmoid colon is
mildly distended proximally.

Lymphatic: No definite adenopathy.

Reproductive: Unremarkable

Other: Small amount of pelvic and abdominal ascites. Left
retroperitoneal poorly marginated fluid. A few bubbles of
intraperitoneal gas presumably postop.

Musculoskeletal: Interval midline anterior abdominal wall incision.
Advanced degenerative disc disease L5-S1. No fracture or worrisome
bone lesion.

Review of the MIP images confirms the above findings.
IMPRESSION: 1. Negative for acute PE or thoracic aortic dissection.
2. Interval infrarenal aortobifem graft repair, patent.
3. Interval long segment occlusion of bilateral common iliac
arteries, with distal reconstitution.
4. Small amount of pelvic and abdominal ascites.
5. Small left and trace right pleural effusions.
6. Right middle and lower lobe consolidation/atelectasis with
probable occlusive central bronchial secretions.
7. Coronary artery disease.

Aortic Atherosclerosis ([id number]).

## 2020-12-11 IMAGING — DX DG CHEST 1V PORT
1 series · 1 of 1 positions shown · non-contrast
Comparison: November 09, 2019

CLINICAL DATA: Central line placement

EXAM:
PORTABLE CHEST 1 VIEW

[chest ap]
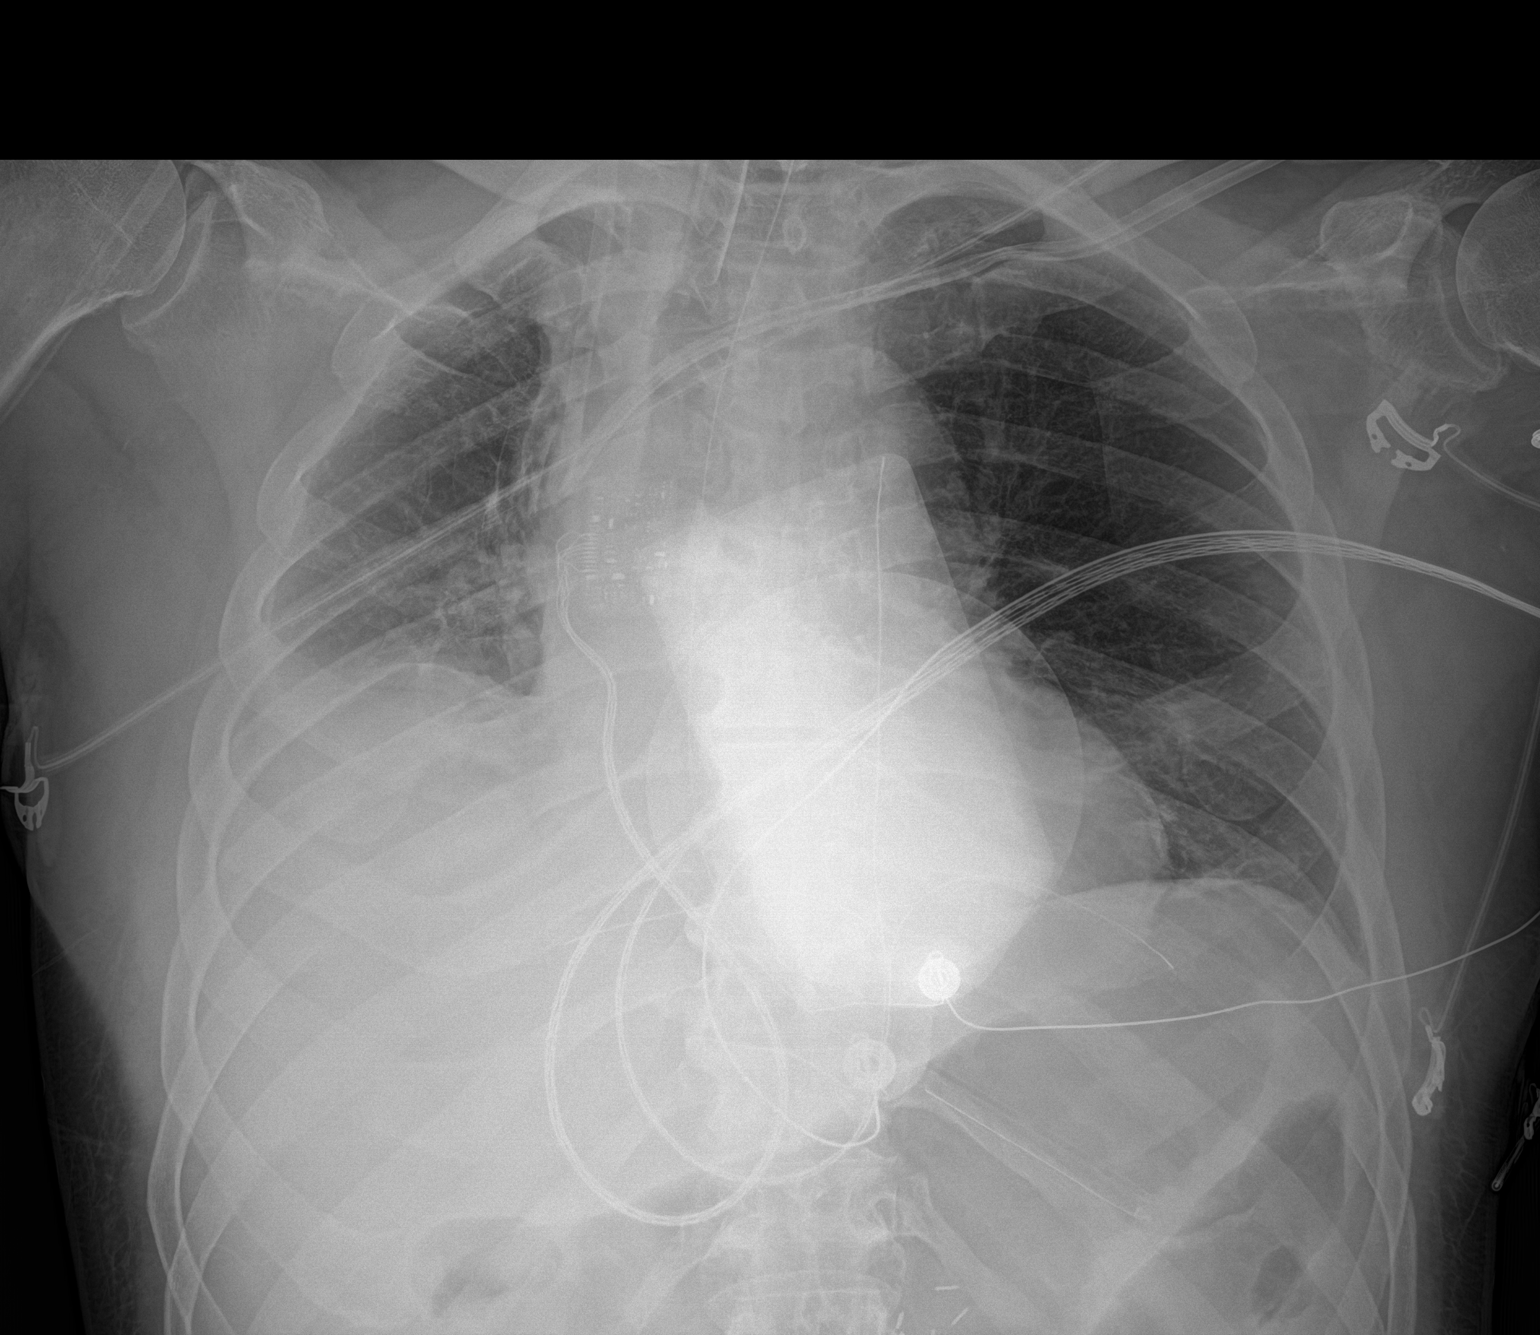

[1 of 1 positions shown; findings below may reference images not displayed]

FINDINGS: The endotracheal tube terminates above the carina. There is a new
left-sided central venous catheter with tip terminating over the
left brachiocephalic vein. The catheter appears kinked in the
midportion. There is a right-sided central venous catheter that is
stable from prior study. The enteric tube terminates over the
gastric body. There is a persistent opacity at the right lung base
consistent with a right-sided pleural effusion with associated
airspace disease. There is a small left-sided pleural effusion. The
cardiac silhouette is unchanged.
IMPRESSION: 1. New left subclavian approach central venous catheter. This
catheter appears kinked in the midportion. There is no left-sided
pneumothorax.
2. Remaining lines and tubes as above.
3. Persistent bilateral pleural effusions, right greater than left,
with adjacent airspace disease.

## 2020-12-13 IMAGING — DX DG CHEST 1V PORT
1 series · 1 of 1 positions shown · non-contrast
Comparison: 11/10/2019.

CLINICAL DATA: Intubation.

EXAM:
PORTABLE CHEST 1 VIEW

[chest]
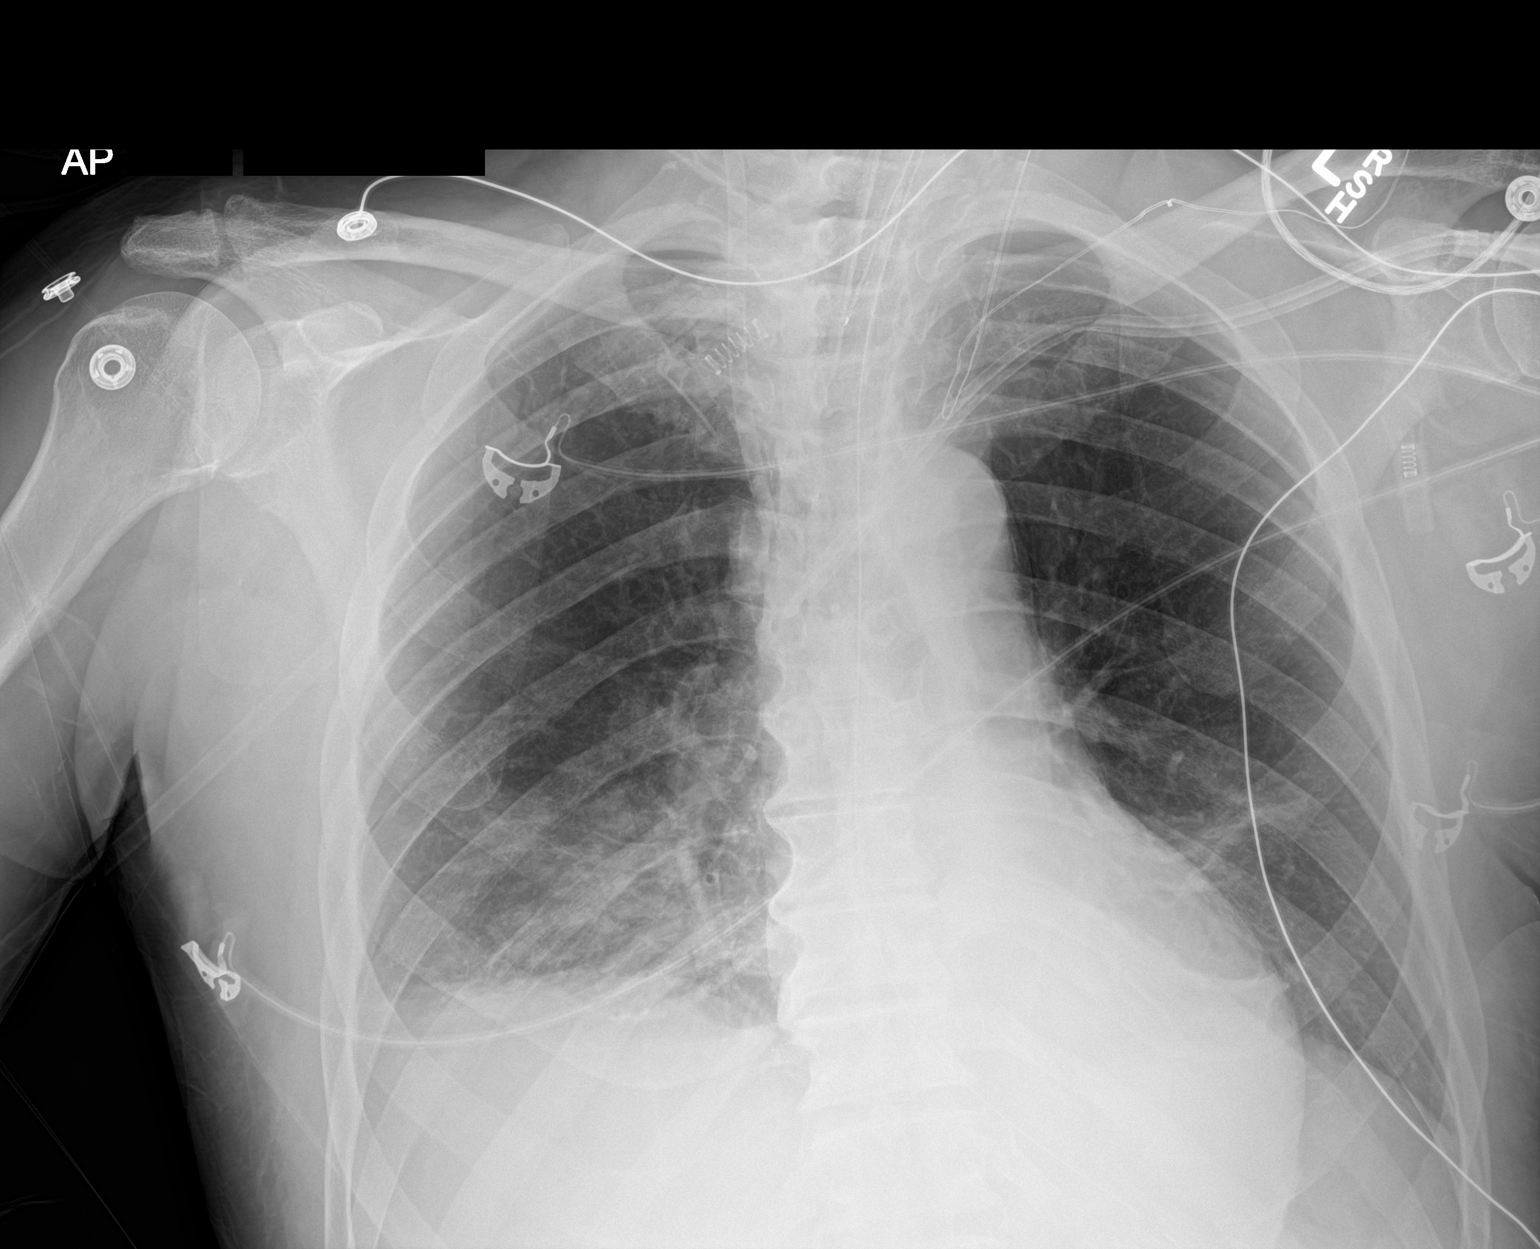

[1 of 1 positions shown; findings below may reference images not displayed]

FINDINGS: Endotracheal tube, NG tube, right IJ sheath, left subclavian line in
stable position. Heart size stable. Bibasilar infiltrates/edema
noted on today's exam. Bibasilar atelectasis. Small right pleural
effusion. No pneumothorax. Degenerative change thoracic spine.
IMPRESSION: 1. Bibasilar infiltrates/edema noted on today's exam. Bibasilar
atelectasis. Small right pleural effusion.

2.  Lines and tubes in stable position.
